# Patient Record
Sex: Female | Born: 1980 | Race: White | Hispanic: No | Marital: Married | Smoking: Never smoker
Health system: Southern US, Community
[De-identification: ages and names within clinical notes are randomized; demographics above are authoritative.]

## PROBLEM LIST (undated history)

## (undated) ENCOUNTER — Emergency Department (HOSPITAL_COMMUNITY): Admission: EM | Payer: Self-pay | Source: Home / Self Care

## (undated) DIAGNOSIS — F32A Depression, unspecified: Secondary | ICD-10-CM

## (undated) DIAGNOSIS — Z765 Malingerer [conscious simulation]: Secondary | ICD-10-CM

## (undated) DIAGNOSIS — F329 Major depressive disorder, single episode, unspecified: Secondary | ICD-10-CM

## (undated) DIAGNOSIS — R569 Unspecified convulsions: Secondary | ICD-10-CM

## (undated) DIAGNOSIS — F1110 Opioid abuse, uncomplicated: Secondary | ICD-10-CM

## (undated) DIAGNOSIS — F4310 Post-traumatic stress disorder, unspecified: Secondary | ICD-10-CM

## (undated) DIAGNOSIS — N200 Calculus of kidney: Secondary | ICD-10-CM

## (undated) DIAGNOSIS — T1491XA Suicide attempt, initial encounter: Secondary | ICD-10-CM

## (undated) DIAGNOSIS — J45909 Unspecified asthma, uncomplicated: Secondary | ICD-10-CM

## (undated) DIAGNOSIS — R51 Headache: Secondary | ICD-10-CM

## (undated) HISTORY — PX: CHOLECYSTECTOMY: SHX55

## (undated) HISTORY — PX: TONSILLECTOMY: SUR1361

## (undated) HISTORY — PX: APPENDECTOMY: SHX54

## (undated) HISTORY — PX: ANKLE SURGERY: SHX546

## (undated) HISTORY — PX: KNEE SURGERY: SHX244

## (undated) HISTORY — PX: INCISIONAL HERNIA REPAIR: SHX193

## (undated) HISTORY — DX: Post-traumatic stress disorder, unspecified: F43.10

---

## 2002-03-17 ENCOUNTER — Inpatient Hospital Stay (HOSPITAL_COMMUNITY): Admission: AD | Admit: 2002-03-17 | Discharge: 2002-03-17 | Payer: Self-pay | Admitting: *Deleted

## 2011-08-27 ENCOUNTER — Emergency Department (HOSPITAL_COMMUNITY): Payer: Medicaid Other

## 2011-08-27 ENCOUNTER — Encounter (HOSPITAL_COMMUNITY): Payer: Self-pay | Admitting: Emergency Medicine

## 2011-08-27 ENCOUNTER — Emergency Department (HOSPITAL_COMMUNITY)
Admission: EM | Admit: 2011-08-27 | Discharge: 2011-08-27 | Disposition: A | Discharge status: Home / Self Care | Payer: Medicaid Other | Attending: Emergency Medicine | Admitting: Emergency Medicine

## 2011-08-27 DIAGNOSIS — O2390 Unspecified genitourinary tract infection in pregnancy, unspecified trimester: Secondary | ICD-10-CM | POA: Insufficient documentation

## 2011-08-27 DIAGNOSIS — R109 Unspecified abdominal pain: Secondary | ICD-10-CM | POA: Insufficient documentation

## 2011-08-27 DIAGNOSIS — N390 Urinary tract infection, site not specified: Secondary | ICD-10-CM | POA: Insufficient documentation

## 2011-08-27 DIAGNOSIS — O26899 Other specified pregnancy related conditions, unspecified trimester: Secondary | ICD-10-CM | POA: Insufficient documentation

## 2011-08-27 DIAGNOSIS — R10814 Left lower quadrant abdominal tenderness: Secondary | ICD-10-CM | POA: Insufficient documentation

## 2011-08-27 HISTORY — DX: Calculus of kidney: N20.0

## 2011-08-27 HISTORY — DX: Major depressive disorder, single episode, unspecified: F32.9

## 2011-08-27 HISTORY — DX: Depression, unspecified: F32.A

## 2011-08-27 LAB — URINALYSIS, ROUTINE W REFLEX MICROSCOPIC
Nitrite: NEGATIVE
Specific Gravity, Urine: 1.032 — ABNORMAL HIGH (ref 1.005–1.030)
Urobilinogen, UA: 0.2 mg/dL (ref 0.0–1.0)
pH: 6 (ref 5.0–8.0)

## 2011-08-27 LAB — DIFFERENTIAL
Eosinophils Absolute: 0.1 10*3/uL (ref 0.0–0.7)
Lymphs Abs: 1.4 10*3/uL (ref 0.7–4.0)
Monocytes Relative: 5 % (ref 3–12)
Neutro Abs: 8.7 10*3/uL — ABNORMAL HIGH (ref 1.7–7.7)
Neutrophils Relative %: 81 % — ABNORMAL HIGH (ref 43–77)

## 2011-08-27 LAB — CBC
Hemoglobin: 11.3 g/dL — ABNORMAL LOW (ref 12.0–15.0)
MCH: 28.9 pg (ref 26.0–34.0)
Platelets: 159 10*3/uL (ref 150–400)
RBC: 3.91 MIL/uL (ref 3.87–5.11)
WBC: 10.7 10*3/uL — ABNORMAL HIGH (ref 4.0–10.5)

## 2011-08-27 LAB — URINE MICROSCOPIC-ADD ON

## 2011-08-27 LAB — POCT PREGNANCY, URINE: Preg Test, Ur: POSITIVE — AB

## 2011-08-27 MED ORDER — SODIUM CHLORIDE 0.9 % IV BOLUS (SEPSIS)
250.0000 mL | Freq: Once | INTRAVENOUS | Status: AC
  Administered 2011-08-27: 03:00:00 via INTRAVENOUS

## 2011-08-27 MED ORDER — ACETAMINOPHEN 500 MG PO TABS
1000.0000 mg | ORAL_TABLET | Freq: Once | ORAL | Status: AC
  Administered 2011-08-27: 1000 mg via ORAL
  Filled 2011-08-27: qty 2

## 2011-08-27 MED ORDER — NITROFURANTOIN MONOHYD MACRO 100 MG PO CAPS: 100.0000 mg | ORAL_CAPSULE | Freq: Two times a day (BID) | ORAL | Status: AC

## 2011-08-27 NOTE — ED Provider Notes (Signed)
Medical screening examination/treatment/procedure(s) were performed by non-physician practitioner and as supervising physician I was immediately available for consultation/collaboration.  Jasmine Awe, MD 08/27/11 [phone]

## 2011-08-27 NOTE — ED Notes (Signed)
Victoria Middleton [phone], pt's friend who drove her to the ER. Please call her if there are any questions, concerns, or pt is ready for discharge.

## 2011-08-27 NOTE — ED Notes (Signed)
Pt given discharge instructions and verb understanding, pt assisted to discharge window amb with steady gait

## 2011-08-27 NOTE — ED Notes (Addendum)
Pt presented to the ER with c/o abd pain in LLQ, 8/10, nauseous, denies vomiting, pain started about 20 min ago, G2, [redacted] weeks pregnant, one living child, premature at 34 weeks, pt denies any bleeding noted at this time.

## 2011-08-27 NOTE — ED Notes (Signed)
Sterile pelvic exam ordered per NP Manus Rudd. NP Manus Rudd and RN Ginger made aware that sterile speculum not immediately available. Materials called to retrieve speculum. Pt made aware of delay as well.

## 2011-08-27 NOTE — ED Provider Notes (Signed)
7:11 AM  Received sign out from Earley Favor, NP, at change of shift.  Patient is a 31 year old woman who is sixteen weeks pregnant with a confirmed intrauterine pregnancy, who presented today with left lower quadrant pain.  She is currently pending ultrasound.  9:20 AM Patient reports pain is well controlled currently, now 4/10.  No needs at this time.  Awaiting ultrasound results.   10:08 AM Called radiology for ultrasound results not showing up in computer.  Spoke with Johnny Bridge.    11:05 AM Discussed results with patient.  IUP is normal, ovaries not visualized.  +UTI.  Advised close follow up with obstetrician (in [place]).  Patient in no distressed and pain controlled at this time. Discharged home with recommendations for tylenol for pain and return for worsening symptoms.  Patient verbalizes understanding and agrees with plan. Rise Patience, PA 08/27/11 1106

## 2011-08-27 NOTE — ED Provider Notes (Signed)
History     CSN: [id number]  Arrival date & time 08/27/11  0147   First MD Initiated Contact with Patient 08/27/11 0214      Chief Complaint  Patient presents with  . Illegal value: [    pregnant [redacted]weeks  . Abdominal Pain    LLQ    (Consider location/radiation/quality/duration/timing/severity/associated sxs/prior treatment) HPI Comments: Patient is [redacted] weeks pregnant documented by her OB/GYN in [place], has had an uncomplicated pregnancy to this point.  Tonight was out with friends and Institute and noticed left lower quadrant pain without discharge, urinary frequency, not, nausea, but no vomiting.  Patient is a 31 y.o. female presenting with abdominal pain. The history is provided by the patient.  Abdominal Pain The primary symptoms of the illness include abdominal pain and nausea. The primary symptoms of the illness do not include shortness of breath, vomiting, vaginal discharge or vaginal bleeding. The current episode started 3 to 5 hours ago. The onset of the illness was sudden. The problem has not changed since onset. Symptoms associated with the illness do not include chills, constipation or urgency.    Past Medical History  Diagnosis Date  . Kidney stones   . Depression   . Migraine     Past Surgical History  Procedure Date  . Appendectomy   . Cholecystectomy   . Tonsillectomy   . Knee surgery   . Ankle surgery     left    History reviewed. No pertinent family history.  History  Substance Use Topics  . Smoking status: Never Smoker   . Smokeless tobacco: Not on file  . Alcohol Use: No    OB History    Grav Para Term Preterm Abortions TAB SAB Ect Mult Living   1               Review of Systems  Constitutional: Negative for chills.  HENT: Negative for rhinorrhea.   Respiratory: Negative for shortness of breath.   Gastrointestinal: Positive for nausea and abdominal pain. Negative for vomiting and constipation.  Genitourinary: Negative for urgency,  vaginal bleeding, vaginal discharge and vaginal pain.  Neurological: Negative for dizziness.    Allergies  Abilify; Penicillins; Sulfa antibiotics; Toradol; and Naprosyn  Home Medications   Current Outpatient Rx  Name Route Sig Dispense Refill  . PRENATAL MULTIVITAMIN CH Oral Take 1 tablet by mouth daily.    Marland Kitchen PROMETHAZINE HCL 25 MG PO TABS Oral Take 25 mg by mouth every 6 (six) hours as needed.      BP 126/70  Pulse 103  Temp(Src) 98.4 F (36.9 C) (Oral)  Resp 22  SpO2 100%  Physical Exam  Constitutional: She is oriented to person, place, and time. She appears well-developed and well-nourished.  HENT:  Head: Normocephalic.  Eyes: Pupils are equal, round, and reactive to light.  Neck: Normal range of motion.  Cardiovascular: Normal rate.   Pulmonary/Chest: Effort normal.  Abdominal: She exhibits no distension. There is tenderness in the left lower quadrant.  Genitourinary:       No cervical os dilatation.  No vaginal bleeding.  No prolapse of cord patient, examined in a sterile condition  Musculoskeletal: Normal range of motion.  Neurological: She is alert and oriented to person, place, and time.  Skin: Skin is warm.    ED Course  Procedures (including critical care time)  Labs Reviewed  URINALYSIS, ROUTINE W REFLEX MICROSCOPIC - Abnormal; Notable for the following:    Color, Urine AMBER (*) BIOCHEMICALS MAY BE  AFFECTED BY COLOR   APPearance CLOUDY (*)    Specific Gravity, Urine 1.032 (*)    Bilirubin Urine SMALL (*)    Ketones, ur TRACE (*)    Leukocytes, UA MODERATE (*)    All other components within normal limits  CBC - Abnormal; Notable for the following:    WBC 10.7 (*)    Hemoglobin 11.3 (*)    HCT 32.4 (*)    All other components within normal limits  DIFFERENTIAL - Abnormal; Notable for the following:    Neutrophils Relative 81 (*)    Neutro Abs 8.7 (*)    All other components within normal limits  POCT PREGNANCY, URINE - Abnormal; Notable for the  following:    Preg Test, Ur POSITIVE (*)    All other components within normal limits  URINE MICROSCOPIC-ADD ON - Abnormal; Notable for the following:    Squamous Epithelial / LPF MANY (*)    Bacteria, UA FEW (*)    Crystals CA OXALATE CRYSTALS (*)    All other components within normal limits   No results found.   No diagnosis found.    MDM  Will obtain complete pelvic ultrasound of [redacted] weeks gestational age with left lower quadrant        Arman Filter, NP 08/27/11 0422

## 2011-08-27 NOTE — ED Notes (Signed)
Returned from ultrasound.

## 2011-08-28 LAB — URINE CULTURE: Culture  Setup Time: 201302101113

## 2011-08-28 NOTE — ED Provider Notes (Signed)
Medical screening examination/treatment/procedure(s) were performed by non-physician practitioner and as supervising physician I was immediately available for consultation/collaboration.  Jasmine Awe, MD 08/28/11 [phone]

## 2012-09-12 ENCOUNTER — Encounter (HOSPITAL_COMMUNITY): Payer: Self-pay | Admitting: *Deleted

## 2012-09-12 ENCOUNTER — Inpatient Hospital Stay (HOSPITAL_COMMUNITY)
Admission: EM | Admit: 2012-09-12 | Discharge: 2012-09-17 | DRG: 885 | Disposition: A | Discharge status: Home / Self Care | Payer: Medicaid Other | Source: Other Acute Inpatient Hospital | Attending: Psychiatry | Admitting: Psychiatry

## 2012-09-12 DIAGNOSIS — Z79899 Other long term (current) drug therapy: Secondary | ICD-10-CM

## 2012-09-12 DIAGNOSIS — F331 Major depressive disorder, recurrent, moderate: Principal | ICD-10-CM | POA: Diagnosis present

## 2012-09-12 DIAGNOSIS — R569 Unspecified convulsions: Secondary | ICD-10-CM | POA: Diagnosis present

## 2012-09-12 HISTORY — DX: Unspecified convulsions: R56.9

## 2012-09-12 HISTORY — DX: Headache: R51

## 2012-09-12 MED ORDER — CLONIDINE HCL 0.1 MG PO TABS
0.1000 mg | ORAL_TABLET | ORAL | Status: AC
  Administered 2012-09-16 – 2012-09-17 (×2): 0.1 mg via ORAL
  Filled 2012-09-12 (×5): qty 1

## 2012-09-12 MED ORDER — DICYCLOMINE HCL 20 MG PO TABS: 20.0000 mg | ORAL_TABLET | Freq: Four times a day (QID) | ORAL | Status: DC | PRN

## 2012-09-12 MED ORDER — ONDANSETRON 4 MG PO TBDP
4.0000 mg | ORAL_TABLET | Freq: Four times a day (QID) | ORAL | Status: DC | PRN
  Administered 2012-09-15: 4 mg via ORAL

## 2012-09-12 MED ORDER — ALUM & MAG HYDROXIDE-SIMETH 200-200-20 MG/5ML PO SUSP: 30.0000 mL | ORAL | Status: DC | PRN

## 2012-09-12 MED ORDER — HYDROXYZINE HCL 25 MG PO TABS
25.0000 mg | ORAL_TABLET | Freq: Four times a day (QID) | ORAL | Status: DC | PRN
  Administered 2012-09-15 – 2012-09-16 (×2): 25 mg via ORAL

## 2012-09-12 MED ORDER — ASPIRIN-ACETAMINOPHEN-CAFFEINE 250-250-65 MG PO TABS
2.0000 | ORAL_TABLET | Freq: Once | ORAL | Status: AC | PRN
  Filled 2012-09-12: qty 2

## 2012-09-12 MED ORDER — LOPERAMIDE HCL 2 MG PO CAPS: 2.0000 mg | ORAL_CAPSULE | ORAL | Status: DC | PRN

## 2012-09-12 MED ORDER — CLONIDINE HCL 0.1 MG PO TABS
0.1000 mg | ORAL_TABLET | Freq: Four times a day (QID) | ORAL | Status: AC
  Administered 2012-09-12 – 2012-09-14 (×4): 0.1 mg via ORAL
  Filled 2012-09-12 (×14): qty 1

## 2012-09-12 MED ORDER — DULOXETINE HCL 60 MG PO CPEP
60.0000 mg | ORAL_CAPSULE | Freq: Every day | ORAL | Status: DC
  Administered 2012-09-12 – 2012-09-17 (×5): 60 mg via ORAL
  Filled 2012-09-12 (×10): qty 1

## 2012-09-12 MED ORDER — MAGNESIUM HYDROXIDE 400 MG/5ML PO SUSP: 30.0000 mL | Freq: Every day | ORAL | Status: DC | PRN

## 2012-09-12 MED ORDER — IBUPROFEN 200 MG PO TABS
400.0000 mg | ORAL_TABLET | ORAL | Status: DC | PRN
Start: 2012-09-12 — End: 2012-09-12

## 2012-09-12 MED ORDER — TOPIRAMATE 100 MG PO TABS
200.0000 mg | ORAL_TABLET | Freq: Every day | ORAL | Status: DC
  Administered 2012-09-12 – 2012-09-17 (×5): 200 mg via ORAL
  Filled 2012-09-12 (×10): qty 2

## 2012-09-12 MED ORDER — ACETAMINOPHEN 325 MG PO TABS
650.0000 mg | ORAL_TABLET | Freq: Four times a day (QID) | ORAL | Status: DC | PRN
  Administered 2012-09-14 – 2012-09-17 (×5): 650 mg via ORAL

## 2012-09-12 MED ORDER — METHOCARBAMOL 500 MG PO TABS: 500.0000 mg | ORAL_TABLET | Freq: Three times a day (TID) | ORAL | Status: DC | PRN

## 2012-09-12 MED ORDER — CLONIDINE HCL 0.1 MG PO TABS
0.1000 mg | ORAL_TABLET | Freq: Every day | ORAL | Status: DC
  Administered 2012-09-17: 0.1 mg via ORAL
  Filled 2012-09-12 (×2): qty 1
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
## 2012-09-12 NOTE — Progress Notes (Signed)
Patient ID: Victoria Middleton, female   DOB: Dec 06, 1980, 32 y.o.   MRN: [id number] Writer notified on call physician, Dr. Ferol Luz and received orders for Excedrin for migraines. Pt. Refused med saying "that won't help, I've tried all of that". Writer proceeded to encourage client to take meds and speak to physician in am to see if any changes could be made. Pt. Got angry and remarked "I know what I'll do, I'll just refuse all medicine tomorrow" Pt.then storms away from the window and doesn't take any meds. Pt. 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

## 2012-09-12 NOTE — Progress Notes (Signed)
Patient ID: JERRINE URSCHEL, female   DOB: 03/29/1981, 32 y.o.   MRN: [id number] D: Pt in dayroom watching TV, reports migraine at "8" of 10.  " I want take tylenol it's like water to me" "I take foiricet and hydrocodone at home"  Pt. Currently denies SHI, "I miss my babies".  A: Writer introduced self to client, and provided emotional support by listening.  Writer to call physician and report pt. Concerns. Staff will monitor q22min for safety. Staff encouraged karaoke. R: Pt. Is safe on the unit. Pt. Attended karaoke.

## 2012-09-12 NOTE — Progress Notes (Signed)
Adult Psychoeducational Group Note  Date:  09/12/2012 Time:  2000  Group Topic/Focus:  Karaoke  Participation Level:  Active  Participation Quality:  Appropriate, Attentive and Supportive  Affect:  Appropriate  Cognitive:  Appropriate  Insight: Good  Engagement in Group:  Engaged and Supportive  Modes of Intervention:  Support  Additional Comments:  Pt participated by singing a song and being supportive of her peers.   Humberto Seals [facility] 09/12/2012, 10:27 PM
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
## 2012-09-12 NOTE — Tx Team (Signed)
Initial Interdisciplinary Treatment Plan  PATIENT STRENGTHS: (choose at least two) Ability for insight Average or above average intelligence Capable of independent living General fund of knowledge Supportive family/friends  PATIENT STRESSORS: Financial difficulties Occupational concerns   PROBLEM LIST: Problem List/Patient Goals Date to be addressed Date deferred Reason deferred Estimated date of resolution  Depression 09/12/12     Suicidal thoughts 09/12/12                                                DISCHARGE CRITERIA:  Ability to meet basic life and health needs Adequate post-discharge living arrangements Improved stabilization in mood, thinking, and/or behavior Verbal commitment to aftercare and medication compliance  PRELIMINARY DISCHARGE PLAN: Participate in family therapy Return to previous living arrangement  PATIENT/FAMIILY INVOLVEMENT: This treatment plan has been presented to and reviewed with the patient, Victoria Middleton, and/or family member, .  The patient and family have been given the opportunity to ask questions and make suggestions.  Victoria Middleton, [place] 09/12/2012, 5:20 PM

## 2012-09-13 ENCOUNTER — Encounter (HOSPITAL_COMMUNITY): Payer: Self-pay | Admitting: Psychiatry

## 2012-09-13 ENCOUNTER — Inpatient Hospital Stay (HOSPITAL_COMMUNITY): Payer: Medicaid Other

## 2012-09-13 LAB — TOPIRAMATE LEVEL: Topiramate Lvl: 5.2 ug/mL (ref 2.0–25.0)

## 2012-09-13 LAB — CBC WITH DIFFERENTIAL/PLATELET
Hemoglobin: 12.4 g/dL (ref 12.0–15.0)
Lymphocytes Relative: 14 % (ref 12–46)
Lymphs Abs: 1.2 10*3/uL (ref 0.7–4.0)
Monocytes Relative: 7 % (ref 3–12)
Neutro Abs: 6.7 10*3/uL (ref 1.7–7.7)
Neutrophils Relative %: 78 % — ABNORMAL HIGH (ref 43–77)
RBC: 4.57 MIL/uL (ref 3.87–5.11)
WBC: 8.6 10*3/uL (ref 4.0–10.5)

## 2012-09-13 LAB — BASIC METABOLIC PANEL
BUN: 18 mg/dL (ref 6–23)
Chloride: 107 mEq/L (ref 96–112)
Glucose, Bld: 90 mg/dL (ref 70–99)
Potassium: 4.1 mEq/L (ref 3.5–5.1)

## 2012-09-13 MED ORDER — OXYCODONE-ACETAMINOPHEN 5-325 MG PO TABS
1.0000 | ORAL_TABLET | Freq: Once | ORAL | Status: AC
  Administered 2012-09-13: 1 via ORAL
  Filled 2012-09-13: qty 1

## 2012-09-13 MED ORDER — SODIUM CHLORIDE 0.9 % IV SOLN
INTRAVENOUS | Status: DC
  Administered 2012-09-13: 21:00:00 via INTRAVENOUS
  Filled 2012-09-13: qty 1000

## 2012-09-13 MED ORDER — MORPHINE SULFATE 4 MG/ML IJ SOLN
4.0000 mg | Freq: Once | INTRAMUSCULAR | Status: AC
  Administered 2012-09-13: 4 mg via INTRAVENOUS
  Filled 2012-09-13: qty 1

## 2012-09-13 NOTE — Progress Notes (Signed)
D: Patient's affect/mood is flat, depressed and slightly angry at times. Patient observed lying in bed most of the day. She reported on the self inventory sheet that her sleep is fair, appetite is poor, energy level is low and ability to pay attention is poor. Patient rated depression "7" and feelings of hopelessness "8". Writer spoke with patient's father earlier via phone and was informed that the patient will not be able to return home. Patient's father verbalized that he currently has custody of the patient's ten year old daughter and in the process of obtaining custody of the 100 old baby as well. MD and social worker made aware of this information. Patient non-compliant with morning medication regimen.  A: Support and encouragement provided to patient. Administered scheduled medications per ordering MD. Monitor Q15 minute checks for safety.  R: Patient receptive. Denies SI/HI/AVH. Patient remains safe.

## 2012-09-13 NOTE — Tx Team (Signed)
Interdisciplinary Treatment Plan Update   Date Reviewed:  09/13/2012  Time Reviewed:  10:09 AM  Progress in Treatment:   Attending groups: Yes Participating in groups: Yes, patient participated in group Taking medication as prescribed: Yes  Tolerating medication: Yes Family/Significant other contact made: No, contact to be made with family Patient understands diagnosis: Yes  Discussing patient identified problems/goals with staff: Yes Medical problems stabilized or resolved: Yes Denies suicidal/homicidal ideation: Yes Patient has not harmed self or others: Yes  For review of initial/current patient goals, please see plan of care.  Estimated Length of Stay:  3-5 days  Reasons for Continued Hospitalization:  Anxiety Depression Medication stabilization Suicidal ideation  New Problems/Goals identified:    Discharge Plan or Barriers:   Home with outpatient follow up  Additional Comments:  Patient admitted to hospital following ODing on Klonopin.  She currently denies SI/HI and rates depression at and hopeless at nine and anxiety/helplessness at ten.  MD to discharge benzos and pain meds.  Will start back on medication for seizure disorder.  Attendees:  Patient: Victoria Middleton 09/13/2012 10:09 AM   Signature: Patrick North, MD 09/13/2012 10:09 AM  Signature 09/13/2012 10:09 AM  Signature: Harold Barban, RN 09/13/2012 10:09 AM  Signature: 09/13/2012 10:09 AM  Signature:   09/13/2012 10:09 AM  Signature:  Juline Patch, LCSW 09/13/2012 10:09 AM  Signature: Silverio Decamp, PMH-NP 09/13/2012 10:09 AM  Signature:  09/13/2012 10:09 AM  Signature:    Signature:    Signature:    Signature:      Scribe for Treatment Team:   Juline Patch,  09/13/2012 10:09 AM

## 2012-09-13 NOTE — ED Notes (Signed)
Pt was found on floor by bed unresponsive per [facility] staff. Per ems arrival. Pt on floor. Pt alert and oriented x 4 and able to make needs known. Pt c/o head and neck pain. Pt on back board and c-collar.

## 2012-09-13 NOTE — H&P (Signed)
Behavioral Health Medical Screening Exam  Victoria Middleton is an 32 y.o. female.  Review of Systems  Constitutional: Negative.   Eyes: Negative.   Respiratory: Negative.   Cardiovascular: Negative.   Gastrointestinal: Negative.   Genitourinary: Negative.   Musculoskeletal:       Right shoulder pain  Skin: Negative.   Neurological: Negative.   Endo/Heme/Allergies: Negative.   Psychiatric/Behavioral: Positive for depression.    Physical Exam  Constitutional: She is oriented to person, place, and time. She appears well-developed and well-nourished.  HENT:  Head: Normocephalic and atraumatic.  Right Ear: External ear normal.  Left Ear: External ear normal.  Nose: Nose normal.  Mouth/Throat: Oropharynx is clear and moist.  Eyes: Conjunctivae and EOM are normal. Pupils are equal, round, and reactive to light.  Neck: Normal range of motion. Neck supple.  Cardiovascular: Normal rate, regular rhythm, normal heart sounds and intact distal pulses.   Respiratory: Effort normal and breath sounds normal.  GI: Soft. Bowel sounds are normal.  Genitourinary:  deferred  Musculoskeletal: Normal range of motion.  Neurological: She is alert and oriented to person, place, and time. She has normal reflexes.  Skin: Skin is warm and dry.  Multiple bruises and abrasion to her forehead    Blood pressure 137/85, pulse 90, temperature 98.2 F (36.8 C), temperature source Oral, resp. rate 20, height 5\' 7"  (1.702 m), weight 128.822 kg (284 lb).   Nanine Means, PMH-NP 09/13/2012, 12:26 PM

## 2012-09-13 NOTE — Progress Notes (Signed)
[facility] LCSW Group Therapy         Feelings Around Relapse        1:15-2:30 PM    09/13/2012 3:22 PM  Type of Therapy:  Group Therapy  Participation Level:  Active  Participation Quality:  Appropriate and Attentive  Affect:  Appropriate and Depressed  Cognitive:  Appropriate  Insight:  Engaged  Engagement in Therapy:  Engaged  Modes of Intervention:  Education, Exploration, Problem-solving, Rapport Building and Support  Summary of Progress/Problems:  Patient shared relapse for her would mean not taking medications and/or following up with therapist.     Wynn Banker 09/13/2012, 3:22 PM

## 2012-09-13 NOTE — Progress Notes (Signed)
Recreation Therapy Notes   Date: 02.28.2014 Time: 1:30am Location: Art Room      Group Topic/Focus: Decision Making  Participation Level: Did not attend  Jearl Klinefelter, LRT/CTRS   Jearl Klinefelter 09/13/2012 3:58 PM

## 2012-09-13 NOTE — Progress Notes (Signed)
[facility] LCSW Aftercare Discharge Planning Group Note  09/13/2012 10:13 AM  Participation Quality:  Appropriate  Affect:  Appropriate, Depressed, Flat and Tearful  Cognitive:  Appropriate  Insight:  Developing/Improving  Engagement in Group:  Developing/Improving  Modes of Intervention:  Exploration, Problem-solving, Rapport Building and Support  Summary of Progress/Problems: Patient reports admitting to hospital due to ODing on Klonopin.  She cited stressor as being a single parent, mother being in hospital with SI and financial problems due to not having a full time job.  Patient reports having home and transportation but no outpatient services.    Daily workbook provided.  Wynn Banker 09/13/2012, 10:13 AM
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
## 2012-09-13 NOTE — ED Notes (Signed)
Pt out of ed with security and sitter to transport back to [facility]
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
## 2012-09-13 NOTE — ED Provider Notes (Signed)
History    CSN: [id number] Arrival date & time 09/13/12  2006 First MD Initiated Contact with Patient 09/13/12 2047     Chief Complaint  Patient presents with  . Fall  . Seizures    HPI Patient presents to the emergency room after being found on the floor unresponsive. Patient has a history of seizures. She currently is an inpatient at [facility]. The patient is being treated for an overdose of Klonopin, and suicide attempt.  Patient does not know what happened. She does have history of seizures and takes Topamax. Patient complains of pain in her head and neck. She denies any chest pain or shortness of breath. She denies any abdominal pain numbness or weakness. .   Past Medical History  Diagnosis Date  . Headache   . Seizures     History reviewed. No pertinent past surgical history.  History reviewed. No pertinent family history.  History  Substance Use Topics  . Smoking status: Never Smoker   . Smokeless tobacco: Never Used  . Alcohol Use: No    OB History   Grav Para Term Preterm Abortions TAB SAB Ect Mult Living                  Review of Systems  All other systems reviewed and are negative.    Allergies  Benadryl; Abilify; Naprosyn; Penicillins; Sulfa antibiotics; and Toradol  Home Medications   Current Outpatient Rx  Name  Route  Sig  Dispense  Refill  . butalbital-acetaminophen-caffeine (FIORICET, ESGIC) 50-325-40 MG per tablet   Oral   Take 1 tablet by mouth every 6 (six) hours as needed for headache.         . clonazePAM (KLONOPIN) 1 MG tablet   Oral   Take 1 mg by mouth 2 (two) times daily as needed for anxiety.          . DULoxetine (CYMBALTA) 60 MG capsule   Oral   Take 60 mg by mouth every morning.          Marland Kitchen HYDROcodone-acetaminophen (NORCO/VICODIN) 5-325 MG per tablet   Oral   Take 1 tablet by mouth every 6 (six) hours as needed for pain.         Marland Kitchen topiramate (TOPAMAX) 100 MG tablet   Oral   Take 200 mg  by mouth every morning.           BP 115/99  Pulse 66  Temp(Src) 98.3 F (36.8 C) (Oral)  Resp 18  Ht 5\' 7"  (1.702 m)  Wt 284 lb (128.822 kg)  BMI 44.47 kg/m2  SpO2 96%  LMP 09/06/2012  Physical Exam  Nursing note and vitals reviewed. Constitutional: She appears well-developed and well-nourished. No distress.  Obese Immobilized with a cervical spine collar  HENT:  Head: Normocephalic.  Right Ear: External ear normal.  Left Ear: External ear normal.  Mild erythema of the forehead  Eyes: Conjunctivae are normal. Right eye exhibits no discharge. Left eye exhibits no discharge. No scleral icterus.  Neck: Neck supple. No tracheal deviation present.  Cardiovascular: Normal rate, regular rhythm and intact distal pulses.   Pulmonary/Chest: Effort normal and breath sounds normal. No stridor. No respiratory distress. She has no wheezes. She has no rales.  Abdominal: Soft. Bowel sounds are normal. She exhibits no distension. There is no tenderness. There is no rebound and no guarding.  Musculoskeletal: She exhibits no edema and no tenderness.       Right shoulder: Normal.  Left shoulder: Normal.       Right hip: Normal.       Left hip: Normal.       Cervical back: She exhibits tenderness and bony tenderness. She exhibits no swelling, no edema and no deformity.       Thoracic back: She exhibits no tenderness and no bony tenderness.       Lumbar back: She exhibits no tenderness and no bony tenderness.  Neurological: She is alert. She has normal strength. No sensory deficit. Cranial nerve deficit:  no gross defecits noted. She exhibits normal muscle tone. She displays no seizure activity. Coordination normal.  Skin: Skin is warm and dry. No rash noted.  Psychiatric: She has a normal mood and affect.    ED Course  Procedures (including critical care time)  Labs Reviewed  CBC WITH DIFFERENTIAL - Abnormal; Notable for the following:    Neutrophils Relative 78 (*)    All other  components within normal limits  BASIC METABOLIC PANEL - Abnormal; Notable for the following:    Creatinine, Ser 1.20 (*)    GFR calc non Af Amer 59 (*)    GFR calc Af Amer 68 (*)    All other components within normal limits  TOPIRAMATE LEVEL  GLUCOSE, CAPILLARY   Ct Head Wo Contrast  09/13/2012  *RADIOLOGY REPORT*  Clinical Data:  Fall, seizure, head and neck pain.  CT HEAD WITHOUT CONTRAST CT CERVICAL SPINE WITHOUT CONTRAST  Technique:  Multidetector CT imaging of the head and cervical spine was performed following the standard protocol without intravenous contrast.  Multiplanar CT image reconstructions of the cervical spine were also generated.  Comparison:   None  CT HEAD  Findings: There is no evidence for acute hemorrhage, hydrocephalus, mass lesion, or abnormal extra-axial fluid collection.  No definite CT evidence for acute infarction.  The visualized paranasal sinuses and mastoid air cells are predominately clear.  No displaced calvarial fracture.  IMPRESSION: No acute intracranial abnormality.  CT CERVICAL SPINE  Findings: Maintained craniocervical relationship.  No dens fracture.  Maintained vertebral body height and alignment.  There is mild degenerative change at C6-7.  Paravertebral soft tissues within normal limits.  Lung apices clear.  IMPRESSION: No acute osseous abnormality of the cervical spine.   Original Report Authenticated By: Jearld Lesch, M.D.    Ct Cervical Spine Wo Contrast  09/13/2012  *RADIOLOGY REPORT*  Clinical Data:  Fall, seizure, head and neck pain.  CT HEAD WITHOUT CONTRAST CT CERVICAL SPINE WITHOUT CONTRAST  Technique:  Multidetector CT imaging of the head and cervical spine was performed following the standard protocol without intravenous contrast.  Multiplanar CT image reconstructions of the cervical spine were also generated.  Comparison:   None  CT HEAD  Findings: There is no evidence for acute hemorrhage, hydrocephalus, mass lesion, or abnormal extra-axial  fluid collection.  No definite CT evidence for acute infarction.  The visualized paranasal sinuses and mastoid air cells are predominately clear.  No displaced calvarial fracture.  IMPRESSION: No acute intracranial abnormality.  CT CERVICAL SPINE  Findings: Maintained craniocervical relationship.  No dens fracture.  Maintained vertebral body height and alignment.  There is mild degenerative change at C6-7.  Paravertebral soft tissues within normal limits.  Lung apices clear.  IMPRESSION: No acute osseous abnormality of the cervical spine.   Original Report Authenticated By: Jearld Lesch, M.D.      1. Major depression   2. Seizure   3. Forehead contusion, initial encounter  MDM  Pt has returned to baseline.  Does not appear post ictal.  She takes topomate for her seizures.  She was admitted to [facility] for overdose.  It is possible that she has been abusing benzodiazepines which could be a contributing factor.  Pt is stable for transfer back to [facility]        Celene Kras, MD 09/13/12 2253

## 2012-09-13 NOTE — Progress Notes (Signed)
Adult Psychoeducational Group Note  Date:  09/13/2012 Time:  11:53 AM  Group Topic/Focus:  Relapse Prevention Planning:   The focus of this group is to define relapse and discuss the need for planning to combat relapse.  Participation Level:  Active  Participation Quality:  Attentive  Affect:  Appropriate  Cognitive:  Oriented  Insight: Good  Engagement in Group:  Lacking  Modes of Intervention:  Discussion and Education  Additional Comments:  Pt said she is here for OD and wants to find coping skills for depression. Pt is motivated for recovery.  Ericberto Padget T 09/13/2012, 11:53 AM

## 2012-09-13 NOTE — Progress Notes (Signed)
Psychoeducational Group Note  Date:  09/13/2012 Time:  2000  Group Topic/Focus:  Wrap-Up Group:   The focus of this group is to help patients review their daily goal of treatment and discuss progress on daily workbooks.  Participation Level: Did Not Attend  Participation Quality:  Not Applicable  Affect:  Not Applicable  Cognitive:  Not Applicable  Insight:  Not Applicable  Engagement in Group: Not Applicable  Additional Comments:  The patient was off of the floor during group.   Hazle Coca S 09/13/2012, 11:44 PM
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
## 2012-09-14 MED ORDER — GABAPENTIN 300 MG PO CAPS
300.0000 mg | ORAL_CAPSULE | Freq: Three times a day (TID) | ORAL | Status: DC
  Administered 2012-09-14 – 2012-09-17 (×10): 300 mg via ORAL
  Filled 2012-09-14 (×15): qty 1

## 2012-09-14 MED ORDER — METHOCARBAMOL 500 MG PO TABS
500.0000 mg | ORAL_TABLET | Freq: Three times a day (TID) | ORAL | Status: DC
  Administered 2012-09-14 – 2012-09-17 (×10): 500 mg via ORAL
  Filled 2012-09-14 (×15): qty 1

## 2012-09-14 MED ORDER — NABUMETONE 500 MG PO TABS
500.0000 mg | ORAL_TABLET | Freq: Two times a day (BID) | ORAL | Status: DC
  Administered 2012-09-14 – 2012-09-17 (×7): 500 mg via ORAL
  Filled 2012-09-14 (×11): qty 1
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
## 2012-09-14 NOTE — Progress Notes (Addendum)
D) Pt going up the hall crying and slamming her door. Pt sitting on her bed crying loudly and moaning. When this writer approached Pt. Said "get the f--- out of my room. You all don't give a f---about me anyway. I am in pain". Directing her anger and frustration verbally at this Clinical research associate. Verbalized her feelings about the failure in her overdose attempted and stopped directing her anger outwardly and started talking about herself and what "aloser I am". Also talked directly with Pt about her wanting pain medications and how this has been her focus since walking in the building. A) Provided Pt with a lengthy 1:1. Used therapeutic listening and a gently approach with this Pt. Provided Pt with firm, yet gentle boundaries. Was non judgmental in the approach with Pt. Allowed Pt to verbalize her feelings and was able to validate her feelings when appropriate. Given support and reassurance R) Pt was able to calm down and together with the NP was able to come up with a plan to address Pt's pain. Pt was able to go to lunch and also to attend the program in the afternoon.

## 2012-09-14 NOTE — Progress Notes (Signed)
Verne Spurr PA was called an an order received to have patient sent out to ED after an unwittnessed fall. Writer realized after patient was sent to ED and this order from P.A. had not been obtained. A CT of head and cervical spine done and results were negative. Writer received report from Dillard Essex and at [id number], mht on hall reported a pt down on the floor in her room. Code blue called vs taken and EMS called for pt to be sent out for observation of fall. Patient returned from ED and was on unit at 2330. Patient asked to make phone call and went to her room and laid down. Writer entered patients room and observed her lying on the bed writing in her workbook. Writer informed patient that her hs clonidine that was not given while at the ED was being held after order received from Dr. Hilton Cork d/t medications received while at ED and her bp. Patient voiced no complaints, denies si/hi/a/v hallucinations. Patient encouraged to try and get some rest for tomorrow. Safety maintained on unit with 15 min checks in place, will continue to monitor.

## 2012-09-14 NOTE — Progress Notes (Signed)
Psychoeducational Group Note  Date:  09/14/2012 Time: 1015  Group Topic/Focus:  Identifying Needs:   The focus of this group is to help patients identify their personal needs that have been historically problematic and identify healthy behaviors to address their needs.  Participation Level:  Active  Participation Quality:  Appropriate  Affect:  anxious  Cognitive:  Alert  Insight:  Engaged  Engagement in Group:  Engaged  Additional Comments:    09/14/2012,3:47 PM Ellyse Rotolo, Joie Bimler

## 2012-09-14 NOTE — Progress Notes (Signed)
[facility] MD Progress Note  09/14/2012 10:52 AM Victoria Middleton  MRN:  [id number] Subjective:  Patient upset with NP because she wanted a narcotic pain medication and ibuprofen or Relafen was offered instead.  She stated, she "hopes that we all experience excruciating pain and suffer."  Khamya has cluster B traits and is determined to get what she wants.  She stopped taking her Topamax on admission due to her not receiving narcotic medication until she said she had a seizure last night and fell out of bed.  She was sent to the ED and given morphine and narcotic medications there.  When she realized she was not going to get them here--she threw her chair on the floor and slammed the door.  Refused all pain medications except the ones she wants.  Patient transferred to the 400 hall for safety reasons.   Diagnosis:   Axis I: Substance Abuse and Substance Induced Mood Disorder Axis II: Cluster B Traits Axis III:  Past Medical History  Diagnosis Date  . Headache   . Seizures    Axis IV: economic problems, housing problems, other psychosocial or environmental problems, problems related to social environment and problems with primary support group Axis V: 41-50 serious symptoms  ADL's:  Intact  Sleep: Fair  Appetite:  Fair  Suicidal Ideation:  Denies Homicidal Ideation:  Denies  Psychiatric Specialty Exam: Review of Systems  Constitutional: Negative.   HENT: Negative.   Eyes: Negative.   Respiratory: Negative.   Cardiovascular: Negative.   Gastrointestinal: Negative.   Genitourinary: Negative.   Musculoskeletal:       Shoulder pain  Skin: Negative.   Neurological: Negative.   Endo/Heme/Allergies: Negative.   Psychiatric/Behavioral: Positive for depression and substance abuse. The patient is nervous/anxious.     Blood pressure 103/70, pulse 63, temperature 97.4 F (36.3 C), temperature source Oral, resp. rate 16, height 5\' 7"  (1.702 m), weight 128.822 kg (284 lb), last menstrual period  09/06/2012, SpO2 100.00%.Body mass index is 44.47 kg/(m^2).  General Appearance: Casual  Eye Contact::  Fair  Speech:  Normal Rate  Volume:  Increased  Mood:  Angry  Affect:  Congruent  Thought Process:  Coherent  Orientation:  Full (Time, Place, and Person)  Thought Content:  WDL  Suicidal Thoughts:  No  Homicidal Thoughts:  No  Memory:  Immediate;   Good Recent;   Good Remote;   Good  Judgement:  Poor  Insight:  Lacking  Psychomotor Activity:  Normal  Concentration:  Good  Recall:  Good  Akathisia:  No  Handed:  Right  AIMS (if indicated):     Assets:  Physical Health Resilience  Sleep:  Number of Hours: 4.25   Current Medications: Current Facility-Administered Medications  Medication Dose Route Frequency Provider Last Rate Last Dose  . 0.9 %  sodium chloride infusion   Intravenous Continuous Celene Kras, MD 125 mL/hr at 09/13/12 2121    . acetaminophen (TYLENOL) tablet 650 mg  650 mg Oral Q6H PRN Sanjuana Kava, NP   650 mg at 09/14/12 4098  . alum & mag hydroxide-simeth (MAALOX/MYLANTA) 200-200-20 MG/5ML suspension 30 mL  30 mL Oral Q4H PRN Sanjuana Kava, NP      . cloNIDine (CATAPRES) tablet 0.1 mg  0.1 mg Oral QID Sanjuana Kava, NP   0.1 mg at 09/13/12 1723   Followed by  . [START ON 09/15/2012] cloNIDine (CATAPRES) tablet 0.1 mg  0.1 mg Oral BH-qamhs Sanjuana Kava, NP  Followed by  . [START ON 09/17/2012] cloNIDine (CATAPRES) tablet 0.1 mg  0.1 mg Oral QAC breakfast Sanjuana Kava, NP      . dicyclomine (BENTYL) tablet 20 mg  20 mg Oral Q6H PRN Sanjuana Kava, NP      . DULoxetine (CYMBALTA) DR capsule 60 mg  60 mg Oral Daily Sanjuana Kava, NP   60 mg at 09/14/12 0825  . hydrOXYzine (ATARAX/VISTARIL) tablet 25 mg  25 mg Oral Q6H PRN Sanjuana Kava, NP      . loperamide (IMODIUM) capsule 2-4 mg  2-4 mg Oral PRN Sanjuana Kava, NP      . magnesium hydroxide (MILK OF MAGNESIA) suspension 30 mL  30 mL Oral Daily PRN Sanjuana Kava, NP      . methocarbamol (ROBAXIN) tablet 500  mg  500 mg Oral Q8H PRN Sanjuana Kava, NP      . ondansetron (ZOFRAN-ODT) disintegrating tablet 4 mg  4 mg Oral Q6H PRN Sanjuana Kava, NP      . topiramate (TOPAMAX) tablet 200 mg  200 mg Oral Daily Sanjuana Kava, NP   200 mg at 09/14/12 0825    Lab Results:  Results for orders placed during the hospital encounter of 09/12/12 (from the past 48 hour(s))  TOPIRAMATE LEVEL     Status: None   Collection Time    09/12/12  7:47 PM      Result Value Range   Topiramate Lvl 5.2  2.0 - 25.0 ug/mL  GLUCOSE, CAPILLARY     Status: None   Collection Time    09/13/12  7:36 PM      Result Value Range   Glucose-Capillary 85  70 - 99 mg/dL  CBC WITH DIFFERENTIAL     Status: Abnormal   Collection Time    09/13/12  8:57 PM      Result Value Range   WBC 8.6  4.0 - 10.5 K/uL   RBC 4.57  3.87 - 5.11 MIL/uL   Hemoglobin 12.4  12.0 - 15.0 g/dL   HCT 14.7  82.9 - 56.2 %   MCV 84.5  78.0 - 100.0 fL   MCH 27.1  26.0 - 34.0 pg   MCHC 32.1  30.0 - 36.0 g/dL   RDW 13.0  86.5 - 78.4 %   Platelets 210  150 - 400 K/uL   Neutrophils Relative 78 (*) 43 - 77 %   Neutro Abs 6.7  1.7 - 7.7 K/uL   Lymphocytes Relative 14  12 - 46 %   Lymphs Abs 1.2  0.7 - 4.0 K/uL   Monocytes Relative 7  3 - 12 %   Monocytes Absolute 0.6  0.1 - 1.0 K/uL   Eosinophils Relative 1  0 - 5 %   Eosinophils Absolute 0.1  0.0 - 0.7 K/uL   Basophils Relative 0  0 - 1 %   Basophils Absolute 0.0  0.0 - 0.1 K/uL  BASIC METABOLIC PANEL     Status: Abnormal   Collection Time    09/13/12  8:57 PM      Result Value Range   Sodium 138  135 - 145 mEq/L   Potassium 4.1  3.5 - 5.1 mEq/L   Chloride 107  96 - 112 mEq/L   CO2 21  19 - 32 mEq/L   Glucose, Bld 90  70 - 99 mg/dL   BUN 18  6 - 23 mg/dL   Creatinine, Ser 6.96 (*) 0.50 -  1.10 mg/dL   Calcium 8.6  8.4 - 81.1 mg/dL   GFR calc non Af Amer 59 (*) >90 mL/min   GFR calc Af Amer 68 (*) >90 mL/min   Comment:            The eGFR has been calculated     using the CKD EPI equation.      This calculation has not been     validated in all clinical     situations.     eGFR's persistently     <90 mL/min signify     possible Chronic Kidney Disease.    Physical Findings: AIMS: Facial and Oral Movements Muscles of Facial Expression: None, normal Lips and Perioral Area: None, normal Jaw: None, normal Tongue: None, normal,Extremity Movements Upper (arms, wrists, hands, fingers): None, normal Lower (legs, knees, ankles, toes): None, normal, Trunk Movements Neck, shoulders, hips: None, normal, Overall Severity Severity of abnormal movements (highest score from questions above): None, normal Incapacitation due to abnormal movements: None, normal Patient's awareness of abnormal movements (rate only patient's report): No Awareness, Dental Status Current problems with teeth and/or dentures?: No Does patient usually wear dentures?: No  CIWA:  CIWA-Ar Total: 0 COWS:     Treatment Plan Summary: Daily contact with patient to assess and evaluate symptoms and progress in treatment Medication management  Plan:  Review of chart, vital signs, medications, and notes. 1-Individual and group therapy 2-Medication management for depression and anxiety:  Medications reviewed with the patient and she is demanding narcotic pain medications---refused to eat on admission until she got her narcotic pain medications but changed her mind, reported a seizure and a fall last night and sent to the ED 3-Coping skills for depression, anxiety, and back pain 4-Continue crisis stabilization and management 5-Address health issues--stable vital signs 6-Treatment plan in progress to prevent relapse of depression and anxiety  Medical Decision Making Problem Points:  Established problem, stable/improving (1) and Review of psycho-social stressors (1) Data Points:  Review of medication regiment & side effects (2)  I certify that inpatient services furnished can reasonably be expected to improve the patient's  condition.   Nanine Means, PMh-NP 09/14/2012, 10:52 AM

## 2012-09-14 NOTE — Progress Notes (Signed)
Goals l Group Note  Date:  0930 Time: 03/-07/2012 Group Topic/Focus:  Identifying  Goals : The focus of this group is to help the patients identify goals they want to obtain as well as to motivate the patients to begin to make the positive changes in their lives they need to make.  Participation Level:  active Participation Quality: good Affect: flat Cognitive:    Insight:  good  Engagement in Group: engaged  Additional Comments:  PDuke RN [employer]

## 2012-09-15 NOTE — Progress Notes (Signed)
Psychoeducational Group Note  Date:  09/15/2012 Time:  1015  Group Topic/Focus:  Making Healthy Choices:   The focus of this group is to help patients identify negative/unhealthy choices they were using prior to admission and identify positive/healthier coping strategies to replace them upon discharge.  Participation Level:  Active  Participation Quality:  Appropriate  Affect:  Appropriate  Cognitive:  Alert  Insight:  Improving  Engagement in Group:  Improving  Additional Comments:    Dione Housekeeper 09/15/2012

## 2012-09-15 NOTE — Progress Notes (Signed)
Group Topic/Focus:  Gratefulness:  The focus of this group is to help patients identify what two things they are most grateful for in their lives. What helps ground them and to center them on their work to their recovery.  Participation Level:  Active  Participation Quality:  Attentive  Affect:  Appropriate  Cognitive:  Alert  Insight:  Engaged  Engagement in Group:  Engaged  Additional Comments:  Pt stated that she was grateful for her children.  Dione Housekeeper

## 2012-09-15 NOTE — Progress Notes (Signed)
Writer has observed patient in her room lying in bed asleep off and on. Patient did not attend group this evening. Writer entered patients room and informed her about medication due and one was overdue. Patient was encouraged to come to medication room to receive her meds. Patient was compliant with scheduled meds and was encouraged to stay up a little and have a snack which she did. Patient was offered support and encouragement, denies si/hi/a/v hallucinations. Safety maintained on unit, will continue to monitor with 15 min checks.

## 2012-09-15 NOTE — Clinical Social Work Note (Signed)
[facility] Group Notes: (Clinical Social Work)   09/15/2012      Type of Therapy:  Group Therapy   Participation Level:  Did Not Attend   Patient did come to group a few minutes before it ended, and was given encouragement for coming, but did not speak other than to tell the group she had a migraine.   Ambrose Mantle, LCSW 09/15/2012, 4:35 PM

## 2012-09-15 NOTE — Progress Notes (Addendum)
D) Pt stated she was not feeling well this morning and felt as though she was going to faint. Patient denies SI and HI. States when she goes home she will take her medication, go to a counselor and try not to self doubt and put herself down. Also wrote on her survey that she needed help with getting counseling and not stay in her room so much while she is here. A) Pt placed on the floor and her legs were placed up on a chair. Pt was given some crackers to eat. Blood pressure was 104/71 and then sitting 111/74.  Pt given positive feedback and given encouragement. R) Pt stated she felt better and was able to go to the dayroom and attend the groups. Attending and participating

## 2012-09-15 NOTE — Progress Notes (Signed)
[facility] MD Progress Note  09/15/2012 2:11 PM Victoria Middleton  MRN:  [id number] Subjective:  "My head hurts so bad."  Patient received PRN medication about ten minutes prior to assessment--positive encouragement given, lights dimmed, cool cloth offered but refused.  When asked what has helped her in the past, "Have to get some kind of pain shot."  Very dramatic in the hallway when she was getting her PRN medications---crying loudly, moaning, squatting.  Dramatic behaviors and actions appear to increase when patient wants narcotic pain medications---stated on admission she would not eat or drink unless she received them,...etc.  Non-narcotic pain medications and muscle relaxer in place to assist the patient during this difficult transition from narcotic use to non-narcotic use, encouragement from staff continues. Diagnosis:   Axis I: Substance Abuse and Substance Induced Mood Disorder Axis II: Cluster B Traits Axis III:  Past Medical History  Diagnosis Date  . Headache   . Seizures    Axis IV: economic problems, occupational problems, other psychosocial or environmental problems, problems related to social environment and problems with primary support group Axis V: 41-50 serious symptoms  ADL's:  Intact  Sleep: Good  Appetite:  Goode  Suicidal Ideation:  Denies Homicidal Ideation:  Denies  Psychiatric Specialty Exam: Review of Systems  Constitutional: Negative.   Eyes: Negative.   Respiratory: Negative.   Cardiovascular: Negative.   Gastrointestinal: Negative.   Genitourinary: Negative.   Musculoskeletal: Negative.   Skin: Negative.   Neurological: Positive for headaches.  Endo/Heme/Allergies: Negative.   Psychiatric/Behavioral: Positive for depression and substance abuse. The patient is nervous/anxious.     Blood pressure 113/71, pulse 75, temperature 98.5 F (36.9 C), temperature source Oral, resp. rate 18, height 5\' 7"  (1.702 m), weight 128.822 kg (284 lb), last menstrual period  09/06/2012, SpO2 100.00%.Body mass index is 44.47 kg/(m^2).  General Appearance: Casual  Eye Contact::  Minimal  Speech:  Normal Rate  Volume:  Increased  Mood:  Anxious and Depressed  Affect:  Labile  Thought Process:  Coherent  Orientation:  Full (Time, Place, and Person)  Thought Content:  WDL  Suicidal Thoughts:  No  Homicidal Thoughts:  No  Memory:  Immediate;   Fair Recent;   Fair Remote;   Fair  Judgement:  Fair  Insight:  Fair  Psychomotor Activity:  Decreased  Concentration:  Fair  Recall:  Fair  Akathisia:  No  Handed:  Right  AIMS (if indicated):     Assets:  Physical Health Resilience  Sleep:  Number of Hours: 6   Current Medications: Current Facility-Administered Medications  Medication Dose Route Frequency Provider Last Rate Last Dose  . 0.9 %  sodium chloride infusion   Intravenous Continuous Celene Kras, MD 125 mL/hr at 09/13/12 2121    . acetaminophen (TYLENOL) tablet 650 mg  650 mg Oral Q6H PRN Sanjuana Kava, NP   650 mg at 09/15/12 1320  . alum & mag hydroxide-simeth (MAALOX/MYLANTA) 200-200-20 MG/5ML suspension 30 mL  30 mL Oral Q4H PRN Sanjuana Kava, NP      . cloNIDine (CATAPRES) tablet 0.1 mg  0.1 mg Oral BH-qamhs Sanjuana Kava, NP       Followed by  . [START ON 09/17/2012] cloNIDine (CATAPRES) tablet 0.1 mg  0.1 mg Oral QAC breakfast Sanjuana Kava, NP      . dicyclomine (BENTYL) tablet 20 mg  20 mg Oral Q6H PRN Sanjuana Kava, NP      . DULoxetine (CYMBALTA) DR capsule 60  mg  60 mg Oral Daily Sanjuana Kava, NP   60 mg at 09/15/12 1610  . gabapentin (NEURONTIN) capsule 300 mg  300 mg Oral TID Nanine Means, NP   300 mg at 09/15/12 1149  . hydrOXYzine (ATARAX/VISTARIL) tablet 25 mg  25 mg Oral Q6H PRN Sanjuana Kava, NP   25 mg at 09/15/12 1319  . loperamide (IMODIUM) capsule 2-4 mg  2-4 mg Oral PRN Sanjuana Kava, NP      . magnesium hydroxide (MILK OF MAGNESIA) suspension 30 mL  30 mL Oral Daily PRN Sanjuana Kava, NP      . methocarbamol (ROBAXIN) tablet  500 mg  500 mg Oral TID Nanine Means, NP   500 mg at 09/15/12 1149  . nabumetone (RELAFEN) tablet 500 mg  500 mg Oral BID Nanine Means, NP   500 mg at 09/15/12 0735  . ondansetron (ZOFRAN-ODT) disintegrating tablet 4 mg  4 mg Oral Q6H PRN Sanjuana Kava, NP   4 mg at 09/15/12 1319  . topiramate (TOPAMAX) tablet 200 mg  200 mg Oral Daily Sanjuana Kava, NP   200 mg at 09/15/12 9604    Lab Results:  Results for orders placed during the hospital encounter of 09/12/12 (from the past 48 hour(s))  GLUCOSE, CAPILLARY     Status: None   Collection Time    09/13/12  7:36 PM      Result Value Range   Glucose-Capillary 85  70 - 99 mg/dL  CBC WITH DIFFERENTIAL     Status: Abnormal   Collection Time    09/13/12  8:57 PM      Result Value Range   WBC 8.6  4.0 - 10.5 K/uL   RBC 4.57  3.87 - 5.11 MIL/uL   Hemoglobin 12.4  12.0 - 15.0 g/dL   HCT 54.0  98.1 - 19.1 %   MCV 84.5  78.0 - 100.0 fL   MCH 27.1  26.0 - 34.0 pg   MCHC 32.1  30.0 - 36.0 g/dL   RDW 47.8  29.5 - 62.1 %   Platelets 210  150 - 400 K/uL   Neutrophils Relative 78 (*) 43 - 77 %   Neutro Abs 6.7  1.7 - 7.7 K/uL   Lymphocytes Relative 14  12 - 46 %   Lymphs Abs 1.2  0.7 - 4.0 K/uL   Monocytes Relative 7  3 - 12 %   Monocytes Absolute 0.6  0.1 - 1.0 K/uL   Eosinophils Relative 1  0 - 5 %   Eosinophils Absolute 0.1  0.0 - 0.7 K/uL   Basophils Relative 0  0 - 1 %   Basophils Absolute 0.0  0.0 - 0.1 K/uL  BASIC METABOLIC PANEL     Status: Abnormal   Collection Time    09/13/12  8:57 PM      Result Value Range   Sodium 138  135 - 145 mEq/L   Potassium 4.1  3.5 - 5.1 mEq/L   Chloride 107  96 - 112 mEq/L   CO2 21  19 - 32 mEq/L   Glucose, Bld 90  70 - 99 mg/dL   BUN 18  6 - 23 mg/dL   Creatinine, Ser 3.08 (*) 0.50 - 1.10 mg/dL   Calcium 8.6  8.4 - 65.7 mg/dL   GFR calc non Af Amer 59 (*) >90 mL/min   GFR calc Af Amer 68 (*) >90 mL/min   Comment:  The eGFR has been calculated     using the CKD EPI equation.     This  calculation has not been     validated in all clinical     situations.     eGFR's persistently     <90 mL/min signify     possible Chronic Kidney Disease.    Physical Findings: AIMS: Facial and Oral Movements Muscles of Facial Expression: None, normal Lips and Perioral Area: None, normal Jaw: None, normal Tongue: None, normal,Extremity Movements Upper (arms, wrists, hands, fingers): None, normal Lower (legs, knees, ankles, toes): None, normal, Trunk Movements Neck, shoulders, hips: None, normal, Overall Severity Severity of abnormal movements (highest score from questions above): None, normal Incapacitation due to abnormal movements: None, normal Patient's awareness of abnormal movements (rate only patient's report): No Awareness, Dental Status Current problems with teeth and/or dentures?: No Does patient usually wear dentures?: No  CIWA:  CIWA-Ar Total: 0 COWS:     Treatment Plan Summary: Daily contact with patient to assess and evaluate symptoms and progress in treatment Medication management  Plan:  Review of chart, vital signs, medications, and notes. 1-Individual and group therapy 2-Medication management for depression and anxiety:  Medications reviewed and no changes made---gabapentin, robaxin, and Relafen added yesterday to assist her pain 3-Coping skills for depression, anxiety, and pain 4-Continue crisis stabilization and management--staff continues positive support 5-Address health issues--monitoring vital signs, stable 6-Treatment plan in progress to prevent relapse of depression and anxiety  Medical Decision Making Problem Points:  Established problem, stable/improving (1) and Review of psycho-social stressors (1) Data Points:  Review of medication regiment & side effects (2)  I certify that inpatient services furnished can reasonably be expected to improve the patient's condition.   Nanine Means, PMH-NP 09/15/2012, 2:11 PM

## 2012-09-16 ENCOUNTER — Ambulatory Visit (HOSPITAL_COMMUNITY)
Admission: EM | Admit: 2012-09-16 | Discharge: 2012-09-16 | Disposition: A | Discharge status: Home / Self Care | Payer: Medicaid Other | Source: Other Acute Inpatient Hospital | Attending: Psychiatry | Admitting: Psychiatry

## 2012-09-16 NOTE — Progress Notes (Signed)
[facility] LCSW Group Therapy  09/16/2012 3:17 PM  Type of Therapy:  Group Therapy  Participation Level:  Active  Participation Quality:  Appropriate  Affect:  Appropriate, Depressed and Tearful  Cognitive:  Appropriate  Insight:  Engaged  Engagement in Therapy:  Engaged and Improving  Modes of Intervention:  Discussion, Exploration, Problem-solving, Rapport Building and Support  Summary of Progress/Problems:  Patient shared the obstacle she has to overcome is not taking her medications or following up with therapist.  Patient shared she is on the verge of being put out of parents home because of her failure to take care of herself.  Victoria Middleton 09/16/2012, 3:17 PM

## 2012-09-16 NOTE — Tx Team (Signed)
Interdisciplinary Treatment Plan Update   Date Reviewed:  09/16/2012  Time Reviewed:  10:18 AM  Progress in Treatment:   Attending groups: Yes Participating in groups: Yes, patient participated in group Taking medication as prescribed: Yes  Tolerating medication: Yes Family/Significant other contact made:  Contact made with father Patient understands diagnosis: Yes  Discussing patient identified problems/goals with staff: Yes Medical problems stabilized or resolved: Yes Denies suicidal/homicidal ideation: Yes Patient has not harmed self or others: Yes  For review of initial/current patient goals, please see plan of care.  Estimated Length of Stay:  One  days  Reasons for Continued Hospitalization:  Anxiety Depression Medication stabilization  New Problems/Goals identified:    Discharge Plan or Barriers:   Home with outpatient follow up in [facility]  Additional Comments:  Patient advised that father called on Friday and stated patient unable to return to the home.  Patient shared father visited last night but did not make that statement with her.  Patient shared her moods continue to be up and down.  She rates depression at four and all other symptoms at five/six.   Attendees:  Patient: Victoria Middleton 09/16/2012 10:18 AM   Signature: Patrick North, MD 09/16/2012 10:18 AM  Signature  Chinita Greenland, RN 09/16/2012 10:18 AM  Signature: Tennant Cellar, RN 09/16/2012 10:18 AM  Signature: 09/16/2012 10:18 AM  Signature:   09/16/2012 10:18 AM  Signature:  Juline Patch, LCSW 09/16/2012 10:18 AM  Signature: Silverio Decamp, PMH-NP 09/16/2012 10:18 AM  Signature:  09/16/2012 10:18 AM  Signature:    Signature:    Signature:    Signature:      Scribe for Treatment Team:   Juline Patch,  09/16/2012 10:18 AM

## 2012-09-16 NOTE — Progress Notes (Signed)
Writer spoke with patient at medication window when she received her scheduled relafan. Patient was happy to see her 2 children that came to visit today and shared her pictures with Clinical research associate. Patient c/o having neck pain and refused tylenol but was offered heat packs which she accepted. Patient has been in the dayroom watching tv interacting with peers. Patient reports that her goal is to stay on her medications and would like outpatient therapy. Patient offered support and encouragement . Patient denies si/hi/a/v hallucinations. Safety maintained on unit with 15 min checks will continue to monitor.

## 2012-09-16 NOTE — Progress Notes (Signed)
Adult Psychoeducational Group Note  Date:  09/16/2012 Time:  1:28 PM  Group Topic/Focus:  Self Care:   The focus of this group is to help patients understand the importance of self-care in order to improve or restore emotional, physical, spiritual, interpersonal, and financial health.  Participation Level:  Active  Participation Quality:  Appropriate and Attentive  Affect:  Appropriate  Cognitive:  Appropriate  Insight: Appropriate  Engagement in Group:  Engaged  Modes of Intervention:  Discussion  Additional Comments:  Pt was appropriate and sharing will attending group. Pt attended group the last 20 min of group. Pt was willing to have a discussion about having a social circle.   Sharyn Lull 09/16/2012, 1:28 PM

## 2012-09-16 NOTE — Progress Notes (Signed)
[facility] LCSW Aftercare Discharge Planning Group Note  09/16/2012 10:24 AM  Participation Quality:  Appropriate and Attentive  Affect:  Appropriate and Depressed  Cognitive:  Appropriate  Insight:  Engaged  Engagement in Group:  Engaged  Modes of Intervention:  Exploration, Problem-solving, Rapport Building and Support  Summary of Progress/Problems:  Patient denies SI/HI.  She rates depression at four and all other symptoms at five.  Patient shared she is having headaches and rates pain at six.  She stated headaches have increased since having last child.  Wynn Banker 09/16/2012, 10:24 AM

## 2012-09-16 NOTE — Progress Notes (Signed)
Fall assessment: Pt playing in gym at 1615 when she tripped over her feet and fell. Pt reported that she twisted her ankle as she was falling down. Pt fall was witnessed by Raynelle Jan who confirmed pt fall. Pt denies hitting her head during the incident. Pt pain to lt ankle 7/10. Pt ankle assessed, pt is able to motion her ankle plantar and dorsiflexion and able to motion her foot inversion. Pt expressed difficulty with eversion flexion of the foot. Pt do not have any other physical apparent injuries other than her lt ankle. Pt denies pain to head, back, legs, arms and  Wrist. Pt v/s sitting, 98.6, 118/79, 20, 103. V/s standing, B/p 109/79,  p 116.  MD Dan Humphreys made aware and ordered for pt to have Xray.
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
## 2012-09-16 NOTE — Progress Notes (Signed)
[facility] INPATIENT:  Family/Significant Other Suicide Prevention Education  Suicide Prevention Education:  Education Completed;David Stockton, Father, [phone], has been identified by the patient as the family member/significant other with whom the patient will be residing, and identified as the person(s) who will aid the patient in the event of a mental health crisis (suicidal ideations/suicide attempt).  With written consent from the patient, the family member/significant other has been provided the following suicide prevention education, prior to the and/or following the discharge of the patient.  The suicide prevention education provided includes the following:  Suicide risk factors  Suicide prevention and interventions  National Suicide Hotline telephone number  [facility] assessment telephone number  [facility] Emergency Assistance 911  [facility] and/or Residential Mobile Crisis Unit telephone number  Request made of family/significant other to:  Remove weapons (e.g., guns, rifles, knives), all items previously/currently identified as safety concern.  Father advised patient does not have access to guns.  Remove drugs/medications (over-the-counter, prescriptions, illicit drugs), all items previously/currently identified as a safety concern.  The family member/significant other verbalizes understanding of the suicide prevention education information provided.  The family member/significant other agrees to remove the items of safety concern listed above.  Wynn Banker 09/16/2012, 11:30 AM
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
## 2012-09-16 NOTE — Progress Notes (Signed)
D: Patient in the dayroom interacting with peers on approach.  Patient states today was ok but patient states she is in a lot of pain in her left ankle from a fall today.  Patient states she has taken tylenol and used an ice pack but that has not helped her much.  Patient has exhibited attention-seeking behaviors tonight by sitting in the floor in the hallway with her head down.  Writer asked patient what was wrong and patient stated she was dizzy.  Writer escorted patient to her room.  VS taken by MHT at this time VS with in normal limits.  Patient states she is going home tomorrow.  Patient states she has learned that she is going to take her medications and go to therapy when discharged.  Patient denies SI/HI and denies AVH. A: Staff to monitor Q 15 mins for safety.  Encouragement and support offered.  Scheduled medications administered per orders.  Tylenol administered for left ankle pain. R: Patient remains safe on the unit.  Patient attended group tonight.  Patient taking administered medications tonight.  Patient visible on the unit and interacting with peers.  Patient resting in bed with eyes closed on reassessment.

## 2012-09-16 NOTE — Progress Notes (Signed)
D- Victoria Middleton has been tearful and hopeless this morning related to discharge plans and the possibility of not being to go home.  SW informed her that father would allow her to come home.  Denies SI.  Compliant with scheduled medications.  A- PRN medications requested and received for anxiety. Support and firm,compassionate limits set.  Continue current POC and evaluation of treatment goals.Continue 15' checks for safety. R- Remains safe.

## 2012-09-16 NOTE — Progress Notes (Signed)
2 hour post fall assessment: Pt ankle assessed, pt is able to motion her ankle plantar and dorsiflexion and able to motion her foot inversion. Pt expressed difficulty with eversion flexion of the foot. Pt do not have any other physical apparent injuries other than her lt ankle. Pt denies pain to head, back, legs, arms and Wrist. Pt v/s sitting 110/74 80 18 97.9 and standing 116/81 93.  Pt pain 7/10

## 2012-09-17 MED ORDER — NABUMETONE 500 MG PO TABS: 500.0000 mg | ORAL_TABLET | Freq: Two times a day (BID) | ORAL | Status: DC

## 2012-09-17 MED ORDER — HYDROXYZINE HCL 25 MG PO TABS: 25.0000 mg | ORAL_TABLET | Freq: Four times a day (QID) | ORAL | Status: DC | PRN

## 2012-09-17 MED ORDER — TOPIRAMATE 100 MG PO TABS: 200.0000 mg | ORAL_TABLET | Freq: Every morning | ORAL | Status: DC

## 2012-09-17 MED ORDER — GABAPENTIN 300 MG PO CAPS: 300.0000 mg | ORAL_CAPSULE | Freq: Three times a day (TID) | ORAL | Status: DC

## 2012-09-17 MED ORDER — METHOCARBAMOL 500 MG PO TABS: 500.0000 mg | ORAL_TABLET | Freq: Three times a day (TID) | ORAL | Status: DC

## 2012-09-17 MED ORDER — DULOXETINE HCL 60 MG PO CPEP: 60.0000 mg | ORAL_CAPSULE | Freq: Every day | ORAL | Status: DC

## 2012-09-17 NOTE — Progress Notes (Signed)
Adult Psychoeducational Group Note  Date:  09/17/2012 Time:  1:27 PM  Group Topic/Focus:  Recovery Goals:   The focus of this group is to identify appropriate goals for recovery and establish a plan to achieve them.  Participation Level:  Active  Participation Quality:  Appropriate, Attentive and Sharing  Affect:  Appropriate  Cognitive:  Alert and Appropriate  Insight: Appropriate  Engagement in Group:  Engaged  Modes of Intervention:  Discussion  Additional Comments:  Pt was appropriate and attentive while attending group. Pt shared that she wants to get passed trauma and continue to work on her self-esteem. Pt stated that she plans to work on the obstacles by looking at herself in mirror and reminding herself that she is beautiful.   Sharyn Lull 09/17/2012, 1:27 PM

## 2012-09-17 NOTE — Progress Notes (Signed)
[facility] LCSW Aftercare Discharge Planning Group Note  09/17/2012 12:04 PM  Participation Quality:  Appropriate and Attentive  Affect:  Appropriate  Cognitive:  Appropriate  Insight:  Engaged  Engagement in Group:  Engaged  Modes of Intervention:  Exploration, Problem-solving, Rapport Building and Support  Summary of Progress/Problems:  Patient reports doing well and denies SI/HI.  She shared she is looking forward to discharge today but has some apprehension.  Patient rates depression at two and anxiety at three/four.   Wynn Banker 09/17/2012, 12:04 PM

## 2012-09-17 NOTE — [facility] Suicide Risk Assessment (Signed)
Suicide Risk Assessment  Discharge Assessment     Demographic Factors:  Female, caucasian, single  Mental Status Per Nursing Assessment::   On Admission:  Self-harm thoughts  Current Mental Status by Physician: Patient alert and oriented to 4. Denies aH/VH/SI/HI.  Loss Factors: Loss of significant relationship and Financial problems/change in socioeconomic status  Historical Factors: Impulsivity  Risk Reduction Factors:   Sense of responsibility to family, Living with another person, especially a relative, Positive social support and Positive coping skills or problem solving skills  Continued Clinical Symptoms:  Depression:   Recent sense of peace/wellbeing  Cognitive Features That Contribute To Risk:  Cognitively intact  Suicide Risk:  Minimal: No identifiable suicidal ideation.  Patients presenting with no risk factors but with morbid ruminations; may be classified as minimal risk based on the severity of the depressive symptoms  Discharge Diagnoses:   AXIS I:  Major Depression, Recurrent severe AXIS II:  Deferred AXIS III:   Past Medical History  Diagnosis Date  . Headache   . Seizures    AXIS IV:  occupational problems and other psychosocial or environmental problems AXIS V:  61-70 mild symptoms  Plan Of Care/Follow-up recommendations:  Activity:  as tolerated Diet:  regular Follow up with outpatient appointments.  Is patient on multiple antipsychotic therapies at discharge:  No   Has Patient had three or more failed trials of antipsychotic monotherapy by history:  No  Recommended Plan for Multiple Antipsychotic Therapies: NA  RAVI, HIMABINDU 09/17/2012, 11:08 AM

## 2012-09-17 NOTE — H&P (Signed)
Reviewed

## 2012-09-17 NOTE — Progress Notes (Signed)
[facility] Adult Case Management Discharge Plan :  Will you be returning to the same living situation after discharge: Yes,  Patient returning to parent's home. At discharge, do you have transportation home?:Yes,  Father to transport patient home. Do you have the ability to pay for your medications:Yes,  Patient has Medicaid.  Release of information consent forms completed and in the chart;  Patient's signature needed at discharge.  Patient to Follow up at: Follow-up Information   Follow up with [facility] On 09/20/2012. (You are scheduled to see Edmonia James at 9:30 on Friday, September 20, 2012)    Contact information:   [address] [place], [place]    [phone]      Patient denies SI/HI:   Yes,  Patient is not endorsing SI/HI or thoughts of self harm.    Safety Planning and Suicide Prevention discussed:  Yes,  Reviewed during aftercare groups.  Wynn Banker 09/17/2012, 12:32 PM

## 2012-09-17 NOTE — Progress Notes (Signed)
Grief and Loss Group  Patients discussed significant losses and grief in there lives.  Patients explored ways to cope effectively with loss and associated grief emotions.    Pt was active in group and attentive to group members as they shared through offering her personal experience to support group members.  Pt discussed the loss of her son's father and the mourning of his absence for herself and for her son in the future.  Pt transitioned from feeling hopeless to having hope and strength as she reframed her struggle as a source of opportunity.  Pt also discussed hopes for the future and ways to provide self-care as a way to continue to provide for others for whom she cares.    Victoria Middleton Counseling Intern [employer]

## 2012-09-17 NOTE — Progress Notes (Deleted)
[facility] INPATIENT:  Family/Significant Other Suicide Prevention Education  Suicide Prevention Education:  Education Completed;Faustino Congress, Family, L[employer] of [facility] has been identified by the patient as the family member/significant other with whom the patient will be residing, and identified as the person(s) who will aid the patient in the event of a mental health crisis (suicidal ideations/suicide attempt).  With written consent from the patient, the family member/significant other has been provided the following suicide prevention education, prior to the and/or following the discharge of the patient.  The suicide prevention education provided includes the following:  Suicide risk factors  Suicide prevention and interventions  National Suicide Hotline telephone number  [facility] assessment telephone number  [facility] Emergency Assistance 911  [facility] and/or Residential Mobile Crisis Unit telephone number  Request made of family/significant other to:  Remove weapons (e.g., guns, rifles, knives), all items previously/currently identified as safety concern.  Family member reports patient does not have guns.  Remove drugs/medications (over-the-counter, prescriptions, illicit drugs), all items previously/currently identified as a safety concern.  The family member/significant other verbalizes understanding of the suicide prevention education information provided.  The family member/significant other agrees to remove the items of safety concern listed above.  Wynn Banker 09/17/2012, 12:57 PM

## 2012-09-17 NOTE — Progress Notes (Signed)
D:  Patient discharged to home.  All belongings retrieved from room and from locker in the search room.  Patient denies depressive symptoms or suicidal thoughts at this time.  A:  Reviewed all discharge instructions, medications, and follow up care.   R:  Patient states she is ready to leave.  Verbalized understanding of all discharge teaching.  Escorted to the search room and to the front lobby.

## 2012-09-18 ENCOUNTER — Encounter (HOSPITAL_COMMUNITY): Payer: Self-pay | Admitting: Emergency Medicine
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
## 2012-09-20 NOTE — Progress Notes (Signed)
Patient Discharge Instructions:  After Visit Summary (AVS):   Faxed to:  09/20/12 Discharge Summary Note:   Faxed to:  09/20/12 Psychiatric Admission Assessment Note:   Faxed to:  09/20/12 Suicide Risk Assessment - Discharge Assessment:   Faxed to:  09/20/12 Faxed/Sent to the Next Level Care provider:  09/20/12 Faxed to [facility] @ [phone]  Jerelene Redden, 09/20/2012, 3:42 PM

## 2012-09-24 NOTE — Discharge Summary (Signed)
Reviewed

## 2014-05-18 ENCOUNTER — Encounter (HOSPITAL_COMMUNITY): Payer: Self-pay | Admitting: Emergency Medicine

## 2016-01-24 DIAGNOSIS — G43711 Chronic migraine without aura, intractable, with status migrainosus: Secondary | ICD-10-CM | POA: Insufficient documentation

## 2016-04-13 ENCOUNTER — Encounter (HOSPITAL_COMMUNITY): Payer: Self-pay | Admitting: Emergency Medicine

## 2016-04-13 DIAGNOSIS — R1032 Left lower quadrant pain: Secondary | ICD-10-CM | POA: Insufficient documentation

## 2016-04-13 NOTE — ED Triage Notes (Signed)
Pt. reports LLQ pain radiating to lower back onset this week with nausea and emesis , denies diarrhea , no fever or chills.

## 2016-04-14 ENCOUNTER — Emergency Department (HOSPITAL_COMMUNITY): Payer: Self-pay

## 2016-04-14 ENCOUNTER — Emergency Department (HOSPITAL_COMMUNITY)
Admission: EM | Admit: 2016-04-14 | Discharge: 2016-04-14 | Disposition: A | Discharge status: Home / Self Care | Payer: Self-pay | Attending: Emergency Medicine | Admitting: Emergency Medicine

## 2016-04-14 DIAGNOSIS — R1032 Left lower quadrant pain: Secondary | ICD-10-CM

## 2016-04-14 DIAGNOSIS — R109 Unspecified abdominal pain: Secondary | ICD-10-CM

## 2016-04-14 LAB — URINALYSIS, ROUTINE W REFLEX MICROSCOPIC
GLUCOSE, UA: NEGATIVE mg/dL
KETONES UR: NEGATIVE mg/dL
Nitrite: NEGATIVE
PROTEIN: NEGATIVE mg/dL
Specific Gravity, Urine: 1.027 (ref 1.005–1.030)
pH: 6 (ref 5.0–8.0)

## 2016-04-14 LAB — COMPREHENSIVE METABOLIC PANEL
ALK PHOS: 58 U/L (ref 38–126)
ALT: 13 U/L — AB (ref 14–54)
AST: 18 U/L (ref 15–41)
Albumin: 4.3 g/dL (ref 3.5–5.0)
Anion gap: 8 (ref 5–15)
BUN: 13 mg/dL (ref 6–20)
CALCIUM: 9.1 mg/dL (ref 8.9–10.3)
CHLORIDE: 107 mmol/L (ref 101–111)
CO2: 23 mmol/L (ref 22–32)
CREATININE: 1.08 mg/dL — AB (ref 0.44–1.00)
GFR calc non Af Amer: >60 mL/min (ref >60)
GLUCOSE: 117 mg/dL — AB (ref 65–99)
Potassium: 3.9 mmol/L (ref 3.5–5.1)
SODIUM: 138 mmol/L (ref 135–145)
Total Bilirubin: 0.7 mg/dL (ref 0.3–1.2)
Total Protein: 7.2 g/dL (ref 6.5–8.1)

## 2016-04-14 LAB — CBC
HCT: 40 % (ref 36.0–46.0)
Hemoglobin: 13.1 g/dL (ref 12.0–15.0)
MCH: 28 pg (ref 26.0–34.0)
MCHC: 32.8 g/dL (ref 30.0–36.0)
MCV: 85.5 fL (ref 78.0–100.0)
PLATELETS: 214 10*3/uL (ref 150–400)
RBC: 4.68 MIL/uL (ref 3.87–5.11)
RDW: 13 % (ref 11.5–15.5)
WBC: 9.3 10*3/uL (ref 4.0–10.5)

## 2016-04-14 LAB — WET PREP, GENITAL
CLUE CELLS WET PREP: NONE SEEN
Sperm: NONE SEEN
Trich, Wet Prep: NONE SEEN
Yeast Wet Prep HPF POC: NONE SEEN

## 2016-04-14 LAB — URINE MICROSCOPIC-ADD ON

## 2016-04-14 LAB — GC/CHLAMYDIA PROBE AMP (~~LOC~~) NOT AT ARMC
Chlamydia: NEGATIVE
Neisseria Gonorrhea: NEGATIVE

## 2016-04-14 LAB — PREGNANCY, URINE: Preg Test, Ur: NEGATIVE

## 2016-04-14 LAB — LIPASE, BLOOD: Lipase: 34 U/L (ref 11–51)

## 2016-04-14 MED ORDER — SODIUM CHLORIDE 0.9 % IV SOLN
INTRAVENOUS | Status: DC
  Administered 2016-04-14: 04:00:00 via INTRAVENOUS

## 2016-04-14 MED ORDER — IBUPROFEN 800 MG PO TABS: 800.0000 mg | ORAL_TABLET | Freq: Four times a day (QID) | ORAL | 0 refills | Status: DC | PRN

## 2016-04-14 MED ORDER — ONDANSETRON HCL 4 MG/2ML IJ SOLN
4.0000 mg | Freq: Once | INTRAMUSCULAR | Status: AC
  Administered 2016-04-14: 4 mg via INTRAVENOUS

## 2016-04-14 MED ORDER — HYDROMORPHONE HCL 1 MG/ML IJ SOLN
1.0000 mg | Freq: Once | INTRAMUSCULAR | Status: AC
  Administered 2016-04-14: 1 mg via INTRAVENOUS
  Filled 2016-04-14: qty 1

## 2016-04-14 MED ORDER — ONDANSETRON HCL 4 MG/2ML IJ SOLN
4.0000 mg | Freq: Once | INTRAMUSCULAR | Status: AC
  Administered 2016-04-14: 4 mg via INTRAVENOUS
  Filled 2016-04-14: qty 2

## 2016-04-14 MED ORDER — IBUPROFEN 800 MG PO TABS
800.0000 mg | ORAL_TABLET | Freq: Once | ORAL | Status: AC
  Administered 2016-04-14: 800 mg via ORAL
  Filled 2016-04-14: qty 1

## 2016-04-14 NOTE — ED Notes (Signed)
Family at bedside. 

## 2016-04-14 NOTE — ED Notes (Signed)
Pt changed into hospital gown and warm blanket provided

## 2016-04-14 NOTE — ED Notes (Signed)
Patient transported to CT 

## 2016-04-14 NOTE — ED Notes (Signed)
Lab called to verify results of urine preg. No results at this time

## 2016-04-14 NOTE — ED Notes (Signed)
Main lab called to request urine preg add on

## 2016-04-14 NOTE — ED Provider Notes (Signed)
[facility]-EMERGENCY DEPT Provider Note   CSN: [id number] Arrival date & time: 04/13/16  2302  By signing my name below, I, Victoria Middleton, attest that this documentation has been prepared under the direction and in the presence of Arby Barrette, MD . Electronically Signed: Majel Middleton, Scribe. 04/14/2016. 3:05 AM.  History   Chief Complaint Chief Complaint  Patient presents with  . Abdominal Pain   The history is provided by the patient. No language interpreter was used.   HPI Comments: Victoria Middleton is a 35 y.o. female who presents to the Emergency Department complaining of gradually worsening, left lower back and flank pain that began 5 days ago and suddenly worsened yesterday evening. Pt reports her pain radiates into the LLQ of her abdomen. She states associated nausea and 4 episodes of vomiting yesterday. She notes she has taken ibuprofen with no relief. Pt reports hx of kidney stones "a few years ago" and states her current symptoms feel similar. She states she passed the stones on her own and did not require surgery. Pt denies dysuria, hematuria, fever, chills, cough, shortness of breath, and weakness or numbness in her BLE. She is currently having a normal menses.  Past Medical History:  Diagnosis Date  . Depression   . Headache(784.0)   . Kidney stones   . Migraine   . Seizures [facility])    Patient Active Problem List   Diagnosis Date Noted  . Major depressive disorder, recurrent episode, moderate (HCC) 09/13/2012   Past Surgical History:  Procedure Laterality Date  . ANKLE SURGERY     left  . APPENDECTOMY    . CHOLECYSTECTOMY    . KNEE SURGERY    . TONSILLECTOMY      OB History    Gravida Para Term Preterm AB Living   1             SAB TAB Ectopic Multiple Live Births                 Home Medications    Prior to Admission medications   Medication Sig Start Date End Date Taking? Authorizing Provider  butalbital-acetaminophen-caffeine (FIORICET, ESGIC) 50-325-40 MG per  tablet Take 1 tablet by mouth every 6 (six) hours as needed for headache.   Yes Historical Provider, MD  levonorgestrel (MIRENA) 20 MCG/24HR IUD 1 each by Intrauterine route once.   Yes Historical Provider, MD  promethazine (PHENERGAN) 25 MG tablet Take 25 mg by mouth every 6 (six) hours as needed for nausea or vomiting.    Yes Historical Provider, MD  propranolol (INDERAL) 10 MG tablet Take 10 mg by mouth 2 (two) times daily.   Yes Historical Provider, MD    Family History No family history on file.  Social History Social History  Substance Use Topics  . Smoking status: Never Smoker  . Smokeless tobacco: Never Used  . Alcohol use No   Allergies   Aripiprazole; Benadryl [diphenhydramine hcl]; Abilify [aripiprazole]; Ketorolac tromethamine; Naprosyn [naproxen]; Penicillins; Penicillins; Sulfa antibiotics; Sulfa antibiotics; and Toradol [ketorolac tromethamine]  Review of Systems Review of Systems 10 systems reviewed and all are negative for acute change except as noted in the HPI.  Physical Exam Updated Vital Signs BP 124/72 (BP Location: Left Arm)   Pulse 67   Temp 98.6 F (37 C) (Oral)   Resp 18   Wt 284 lb 4.8 oz (129 kg)   LMP 04/12/2016   SpO2 100%   BMI 44.53 kg/m   Physical Exam  Constitutional: She  is oriented to person, place, and time. She appears well-developed and well-nourished. No distress.  HENT:  Head: Normocephalic and atraumatic.  Eyes: EOM are normal.  Neck: Normal range of motion.  Cardiovascular: Normal rate, regular rhythm and normal heart sounds.   Pulmonary/Chest: Effort normal and breath sounds normal.  Abdominal: Soft. She exhibits no distension. There is tenderness. There is no guarding.  Very mild LLQ pain without guarding. No CVA tenderness with percussion.   Genitourinary: Vagina normal.  Genitourinary Comments: Cervix has visible IUD strings. Cervix does not appear friable. There is a small amount of blood in the cervical opening but no  large clot or active bleeding. No pooling blood. Bimanual examination is for moderate to severe left adnexal tenderness. I cannot appreciate mass or fullness. Right adnexa is nontender. A she does not endorse significant pain to cervical motion.  Musculoskeletal: Normal range of motion.  Neurological: She is alert and oriented to person, place, and time.  Skin: Skin is warm and dry.  Psychiatric: She has a normal mood and affect. Judgment normal.  Nursing note and vitals reviewed.  ED Treatments / Results  Labs (all labs ordered are listed, but only abnormal results are displayed) Labs Reviewed  WET PREP, GENITAL - Abnormal; Notable for the following:       Result Value   WBC, Wet Prep HPF POC MANY (*)    All other components within normal limits  COMPREHENSIVE METABOLIC PANEL - Abnormal; Notable for the following:    Glucose, Bld 117 (*)    Creatinine, Ser 1.08 (*)    ALT 13 (*)    All other components within normal limits  URINALYSIS, ROUTINE W REFLEX MICROSCOPIC (NOT AT [facility]) - Abnormal; Notable for the following:    Color, Urine AMBER (*)    APPearance CLOUDY (*)    Hgb urine dipstick LARGE (*)    Bilirubin Urine SMALL (*)    Leukocytes, UA SMALL (*)    All other components within normal limits  URINE MICROSCOPIC-ADD ON - Abnormal; Notable for the following:    Squamous Epithelial / LPF 0-5 (*)    Bacteria, UA RARE (*)    All other components within normal limits  LIPASE, BLOOD  CBC  PREGNANCY, URINE  POC URINE PREG, ED  GC/CHLAMYDIA PROBE AMP ([place]) NOT AT [facility]    EKG  EKG Interpretation None       Radiology Ct Renal Stone Study  Result Date: 04/14/2016 CLINICAL DATA:  LEFT flank pain for 5 days, becoming severe last night. History of renal stones. EXAM: CT ABDOMEN AND PELVIS WITHOUT CONTRAST TECHNIQUE: Multidetector CT imaging of the abdomen and pelvis was performed following the standard protocol without IV contrast. COMPARISON:  CT abdomen and pelvis  May 26, 2013 FINDINGS: LOWER CHEST: Lung bases are clear. The visualized heart size is normal. No pericardial effusion. HEPATOBILIARY: The road normal.  Status post cholecystectomy. PANCREAS: Normal. SPLEEN: Normal. ADRENALS/URINARY TRACT: Kidneys are orthotopic, demonstrating normal size and morphology. No nephrolithiasis, hydronephrosis; limited assessment for renal masses on this nonenhanced examination. The unopacified ureters are normal in course and caliber. Urinary bladder is well distended and unremarkable. Normal adrenal glands. STOMACH/BOWEL: The stomach, small and large bowel are normal in course and caliber without inflammatory changes, the sensitivity may be decreased by lack of enteric contrast. Small duodenal diverticulum again noted. The appendix is not discretely identified, however there are no inflammatory changes in the right lower quadrant. VASCULAR/LYMPHATIC: Aortoiliac vessels are normal in course and  caliber. No lymphadenopathy by CT size criteria. REPRODUCTIVE: IUD inferiorly migrated to lower uterine segment. OTHER: No intraperitoneal free fluid or free air. MUSCULOSKELETAL: Non-acute. RIGHT lateral abdominal wall herniorrhaphy. Scattered old Schmorl's node. IMPRESSION: No urolithiasis, obstructive uropathy nor acute intra-abdominal/ pelvic process. IUD inferiorly positioned in the lower uterine segment. Recommend consultation with OB/GYN on a nonemergent basis. Electronically Signed   By: Awilda Metroourtnay  Bloomer M.D.   On: 04/14/2016 06:22    Procedures Procedures (including critical care time)  Medications Ordered in ED Medications  0.9 %  sodium chloride infusion ( Intravenous Stopped 04/14/16 0454)  HYDROmorphone (DILAUDID) injection 1 mg (1 mg Intravenous Given 04/14/16 0340)  ondansetron (ZOFRAN) injection 4 mg (4 mg Intravenous Given 04/14/16 0345)  HYDROmorphone (DILAUDID) injection 1 mg (1 mg Intravenous Given 04/14/16 0459)  ibuprofen (ADVIL,MOTRIN) tablet 800 mg (800 mg  Oral Given 04/14/16 0700)    DIAGNOSTIC STUDIES:  Oxygen Saturation is 100% on RA, normal by my interpretation.    COORDINATION OF CARE:  2:57 AM Discussed treatment plan with pt at bedside and pt agreed to plan.  Initial Impression / Assessment and Plan / ED Course  I have reviewed the triage vital signs and the nursing notes.  Pertinent labs & imaging results that were available during my care of the patient were reviewed by me and considered in my medical decision making (see chart for details).  Clinical Course    I personally performed the services described in this documentation, which was scribed in my presence. The recorded information has been reviewed and is accurate.   Final Clinical Impressions(s) / ED Diagnoses   Final diagnoses:  LLQ pain   The patient present with severe left lower quadrant pain. CT does not show kidney stone or acute pathology. Pelvic exam was also performed and pelvic ultrasound to rule out torsion. Patient will need follow-up GYN evaluation. Patient is cleared for treatment for pain and outpatient follow-up with GYN. New Prescriptions New Prescriptions   No medications on file     Arby BarretteMarcy Shizuye Rupert, MD 04/23/16 [phone]

## 2016-07-17 HISTORY — PX: ANTERIOR CERVICAL DECOMP/DISCECTOMY FUSION: SHX1161

## 2016-08-24 DIAGNOSIS — G40909 Epilepsy, unspecified, not intractable, without status epilepticus: Secondary | ICD-10-CM

## 2016-10-19 ENCOUNTER — Emergency Department (HOSPITAL_COMMUNITY)
Admission: EM | Admit: 2016-10-19 | Discharge: 2016-10-20 | Disposition: A | Discharge status: Home / Self Care | Payer: Self-pay | Attending: Emergency Medicine | Admitting: Emergency Medicine

## 2016-10-19 ENCOUNTER — Emergency Department (HOSPITAL_COMMUNITY): Payer: Self-pay

## 2016-10-19 ENCOUNTER — Encounter (HOSPITAL_COMMUNITY): Payer: Self-pay

## 2016-10-19 DIAGNOSIS — R4020 Unspecified coma: Secondary | ICD-10-CM

## 2016-10-19 DIAGNOSIS — R55 Syncope and collapse: Secondary | ICD-10-CM | POA: Insufficient documentation

## 2016-10-19 DIAGNOSIS — Z79899 Other long term (current) drug therapy: Secondary | ICD-10-CM | POA: Insufficient documentation

## 2016-10-19 LAB — CBC WITH DIFFERENTIAL/PLATELET
BASOS ABS: 0 10*3/uL (ref 0.0–0.1)
Basophils Relative: 0 %
Eosinophils Absolute: 0.1 10*3/uL (ref 0.0–0.7)
Eosinophils Relative: 2 %
HEMATOCRIT: 37.9 % (ref 36.0–46.0)
HEMOGLOBIN: 12.9 g/dL (ref 12.0–15.0)
LYMPHS ABS: 1.3 10*3/uL (ref 0.7–4.0)
LYMPHS PCT: 17 %
MCH: 29.2 pg (ref 26.0–34.0)
MCHC: 34 g/dL (ref 30.0–36.0)
MCV: 85.7 fL (ref 78.0–100.0)
Monocytes Absolute: 0.5 10*3/uL (ref 0.1–1.0)
Monocytes Relative: 7 %
NEUTROS ABS: 5.7 10*3/uL (ref 1.7–7.7)
NEUTROS PCT: 74 %
Platelets: 204 10*3/uL (ref 150–400)
RBC: 4.42 MIL/uL (ref 3.87–5.11)
RDW: 13.3 % (ref 11.5–15.5)
WBC: 7.6 10*3/uL (ref 4.0–10.5)

## 2016-10-19 LAB — BASIC METABOLIC PANEL
ANION GAP: 6 (ref 5–15)
BUN: 13 mg/dL (ref 6–20)
CHLORIDE: 108 mmol/L (ref 101–111)
CO2: 24 mmol/L (ref 22–32)
Calcium: 8.9 mg/dL (ref 8.9–10.3)
Creatinine, Ser: 0.88 mg/dL (ref 0.44–1.00)
Glucose, Bld: 109 mg/dL — ABNORMAL HIGH (ref 65–99)
POTASSIUM: 3.5 mmol/L (ref 3.5–5.1)
SODIUM: 138 mmol/L (ref 135–145)

## 2016-10-19 LAB — I-STAT TROPONIN, ED: Troponin i, poc: 0 ng/mL (ref 0.00–0.08)

## 2016-10-19 MED ORDER — FENTANYL CITRATE (PF) 100 MCG/2ML IJ SOLN
50.0000 ug | Freq: Once | INTRAMUSCULAR | Status: AC | PRN
  Administered 2016-10-19: 50 ug via INTRAVENOUS
  Filled 2016-10-19: qty 2

## 2016-10-19 MED ORDER — SODIUM CHLORIDE 0.9 % IV BOLUS (SEPSIS)
1000.0000 mL | Freq: Once | INTRAVENOUS | Status: AC
  Administered 2016-10-19: 1000 mL via INTRAVENOUS

## 2016-10-19 MED ORDER — ONDANSETRON HCL 4 MG/2ML IJ SOLN
4.0000 mg | Freq: Once | INTRAMUSCULAR | Status: AC
  Administered 2016-10-19: 4 mg via INTRAVENOUS
  Filled 2016-10-19: qty 2

## 2016-10-19 NOTE — ED Provider Notes (Signed)
[facility]-EMERGENCY DEPT Provider Note   CSN: [id number] Arrival date & time: 10/19/16  2116  By signing my name below, I, Victoria Middleton, attest that this documentation has been prepared under the direction and in the presence of [facility], PA-C. Electronically Signed: Linna Middleton, Scribe. 10/19/2016. 9:44 PM.  History   Chief Complaint Chief Complaint  Patient presents with  . Fall  . Loss of Consciousness    The history is provided by the patient and a significant other. No language interpreter was used.     HPI Comments: Victoria Middleton is a 36 y.o. female with PMHx including seizures on Keppra who presents to the Emergency Department via EMS complaining of constant neck pain and a posterior headache s/p falling and losing consciousness shortly PTA. She states she was exiting a vehicle and lost consciousness for about 10 minutes. Patient reports that when she regained consciousness she was laying on the ground and there were people standing over her. Pt notes her fiance witnessed the event. She states she felt "off" throughout the day today prior to her syncopal episode. She reports some persistent nausea since the fall and endorses pain exacerbation with applied pressure to her neck. Patient notes she had spinal fusion surgery d/t herniated discs in her neck <1 month ago. She is on Keppra for seizures and last took this medication this morning. Her last seizure occurred three months ago. No recent illnesses. No h/o heart problems. Pt's father had a MI in his 60's and she denies any other pertinent cardiac FMHx. She denies dysuria, cough, abdominal pain, vomiting, chest pain, SOB, or any other associated symptoms.  11:05 PM: Fiance now at bedside, further history obtained. He states patient exited the vehicle, took two steps, and fell. He did not notice any shaking or seizure-like activity. No incontinence or drooling. He states patient was unresponsive on the ground for 3 minutes and started  hitting and fighting upon regaining consciousness. He reports patient returned to her normal baseline mental status 15 minutes later.   Past Medical History:  Diagnosis Date  . Depression   . Headache(784.0)   . Kidney stones   . Migraine   . Seizures [facility])     Patient Active Problem List   Diagnosis Date Noted  . Major depressive disorder, recurrent episode, moderate (HCC) 09/13/2012    Past Surgical History:  Procedure Laterality Date  . ANKLE SURGERY     left  . APPENDECTOMY    . CHOLECYSTECTOMY    . KNEE SURGERY    . TONSILLECTOMY      OB History    Gravida Para Term Preterm AB Living   1             SAB TAB Ectopic Multiple Live Births                   Home Medications    Prior to Admission medications   Medication Sig Start Date End Date Taking? Authorizing Provider  levETIRAcetam (KEPPRA) 500 MG tablet Take 500 mg by mouth 2 (two) times daily. 08/07/16  Yes Historical Provider, MD  propranolol (INDERAL) 20 MG tablet Take 20 mg by mouth 2 (two) times daily. 08/07/16 02/03/17 Yes Historical Provider, MD  ibuprofen (ADVIL,MOTRIN) 800 MG tablet Take 1 tablet (800 mg total) by mouth every 6 (six) hours as needed for moderate pain. Patient not taking: Reported on 10/19/2016 04/14/16   Lyndal Pulley, MD  levonorgestrel (MIRENA) 20 MCG/24HR IUD 1 each by Intrauterine route  once.    Historical Provider, MD    Family History History reviewed. No pertinent family history.  Social History Social History  Substance Use Topics  . Smoking status: Never Smoker  . Smokeless tobacco: Never Used  . Alcohol use No     Allergies   Aripiprazole; Benadryl [diphenhydramine hcl]; Abilify [aripiprazole]; Ketorolac tromethamine; Naprosyn [naproxen]; Penicillins; Penicillins; Sulfa antibiotics; Sulfa antibiotics; and Toradol [ketorolac tromethamine]   Review of Systems Review of Systems  Respiratory: Negative for cough and shortness of breath.   Cardiovascular: Negative for  chest pain.  Gastrointestinal: Positive for nausea. Negative for abdominal pain and vomiting.  Genitourinary: Negative for dysuria.  Musculoskeletal: Positive for neck pain.  Neurological: Positive for syncope and headaches.  All other systems reviewed and are negative.  Physical Exam Updated Vital Signs BP 139/74 (BP Location: Right Arm)   Pulse 75   Temp 98.4 F (36.9 C) (Oral)   Resp 18   Ht  (1.727 m)   Wt 127 kg   SpO2 100%   BMI 42.57 kg/m   Physical Exam  Constitutional: She is oriented to person, place, and time. She appears well-developed and well-nourished. No distress. Cervical collar in place.  HENT:  Head: Normocephalic and atraumatic. Head is without raccoon's eyes and without Battle's sign.  Nose: Nose normal.  Mouth/Throat: Oropharynx is clear and moist.  Neck:  C-collar in place. Midline cervical and right paraspinal tenderness.  Cardiovascular: Normal rate, regular rhythm, normal heart sounds and intact distal pulses.   No murmur heard. Good pulses.  Pulmonary/Chest: Effort normal and breath sounds normal. No respiratory distress. She has no wheezes. She has no rales.  Abdominal: Soft. She exhibits no distension. There is no tenderness.  Musculoskeletal:  Straight leg raises negative bilaterally. No T or L spine tenderness.   Neurological: She is alert and oriented to person, place, and time.  Alert, oriented, thought content appropriate, able to give a coherent history. Speech is clear and goal oriented, able to follow commands.  Cranial Nerves:  II:  Peripheral visual fields grossly normal, pupils equal, round, reactive to light III, IV, VI: EOM intact bilaterally, ptosis not present V,VII: smile symmetric, eyes kept closed tightly against resistance, facial light touch sensation equal VIII: hearing grossly normal IX, X: symmetric soft palate movement, uvula elevates symmetrically  XI: bilateral shoulder shrug symmetric and strong XII: midline  tongue extension 5/5 muscle strength in upper and lower extremities bilaterally including strong and equal grip strength and dorsiflexion/plantar flexion Sensory to light touch normal in all four extremities.  Normal finger-to-nose and rapid alternating movements; Negative romberg, no pronator drift.  Skin: Skin is warm and dry.  Nursing note and vitals reviewed.  ED Treatments / Results  Labs (all labs ordered are listed, but only abnormal results are displayed) Labs Reviewed  BASIC METABOLIC PANEL - Abnormal; Notable for the following:       Result Value   Glucose, Bld 109 (*)    All other components within normal limits  CBG MONITORING, ED - Abnormal; Notable for the following:    Glucose-Capillary 102 (*)    All other components within normal limits  CBC WITH DIFFERENTIAL/PLATELET  I-STAT TROPOININ, ED    EKG  EKG Interpretation  Date/Time:  Friday October 20 2016 00:06:45 EDT Ventricular Rate:  77 PR Interval:    QRS Duration: 92 QT Interval:  487 QTC Calculation: 552 R Axis:   30 Text Interpretation:  Sinus rhythm Low voltage, precordial leads Nonspecific T  abnormalities, diffuse leads Prolonged QT interval No previous ECGs available Confirmed by Read Drivers  MD, Jonny Ruiz [phone]) on 10/20/2016 12:09:28 AM Also confirmed by Read Drivers  MD, Jonny Ruiz [phone]), editor [place]  CCT, BEVER[place] ([id number])  on 10/20/2016 6:53:25 AM       Radiology Ct Head Wo Contrast  Result Date: 10/19/2016 CLINICAL DATA:  10 minutes loss of consciousness while exiting a people today. Neck pain and posterior headache. History of seizures. EXAM: CT HEAD WITHOUT CONTRAST CT CERVICAL SPINE WITHOUT CONTRAST TECHNIQUE: Multidetector CT imaging of the head and cervical spine was performed following the standard protocol without intravenous contrast. Multiplanar CT image reconstructions of the cervical spine were also generated. COMPARISON:  CT HEAD April 06, 2013 FINDINGS: CT HEAD FINDINGS BRAIN: No intraparenchymal  hemorrhage, mass effect nor midline shift. The ventricles and sulci are normal. No acute large vascular territory infarcts. No abnormal extra-axial fluid collections. Basal cisterns are patent. VASCULAR: Unremarkable. SKULL/SOFT TISSUES: No skull fracture. No significant soft tissue swelling. ORBITS/SINUSES: The included ocular globes and orbital contents are normal.Status post FESS/ethmoidectomies. OTHER: None. CT CERVICAL SPINE FINDINGS ALIGNMENT: Maintained lordosis. Vertebral bodies in alignment. SKULL BASE AND VERTEBRAE: Cervical vertebral bodies and posterior elements are intact. Status post C5-6 ACDF with arthrodesis. No periprosthetic lucency. Intervertebral disc heights preserved. No destructive bony lesions. C1-2 articulation maintained. SOFT TISSUES AND SPINAL CANAL: Normal. DISC LEVELS: No significant osseous canal stenosis or neural foraminal narrowing. UPPER CHEST: Lung apices are clear. OTHER: None. IMPRESSION: CT HEAD: Normal. CT CERVICAL SPINE: No acute fracture or malalignment. C5-6 ACDF and arthrodesis. Electronically Signed   By: Awilda Metro M.D.   On: 10/19/2016 22:45   Ct Cervical Spine Wo Contrast  Result Date: 10/19/2016 CLINICAL DATA:  10 minutes loss of consciousness while exiting a people today. Neck pain and posterior headache. History of seizures. EXAM: CT HEAD WITHOUT CONTRAST CT CERVICAL SPINE WITHOUT CONTRAST TECHNIQUE: Multidetector CT imaging of the head and cervical spine was performed following the standard protocol without intravenous contrast. Multiplanar CT image reconstructions of the cervical spine were also generated. COMPARISON:  CT HEAD April 06, 2013 FINDINGS: CT HEAD FINDINGS BRAIN: No intraparenchymal hemorrhage, mass effect nor midline shift. The ventricles and sulci are normal. No acute large vascular territory infarcts. No abnormal extra-axial fluid collections. Basal cisterns are patent. VASCULAR: Unremarkable. SKULL/SOFT TISSUES: No skull fracture. No  significant soft tissue swelling. ORBITS/SINUSES: The included ocular globes and orbital contents are normal.Status post FESS/ethmoidectomies. OTHER: None. CT CERVICAL SPINE FINDINGS ALIGNMENT: Maintained lordosis. Vertebral bodies in alignment. SKULL BASE AND VERTEBRAE: Cervical vertebral bodies and posterior elements are intact. Status post C5-6 ACDF with arthrodesis. No periprosthetic lucency. Intervertebral disc heights preserved. No destructive bony lesions. C1-2 articulation maintained. SOFT TISSUES AND SPINAL CANAL: Normal. DISC LEVELS: No significant osseous canal stenosis or neural foraminal narrowing. UPPER CHEST: Lung apices are clear. OTHER: None. IMPRESSION: CT HEAD: Normal. CT CERVICAL SPINE: No acute fracture or malalignment. C5-6 ACDF and arthrodesis. Electronically Signed   By: Awilda Metro M.D.   On: 10/19/2016 22:45    Procedures Procedures (including critical care time)  DIAGNOSTIC STUDIES: Oxygen Saturation is 100% on RA, normal by my interpretation.    COORDINATION OF CARE: 9:55 PM Discussed treatment plan with pt at bedside and pt agreed to plan.  Medications Ordered in ED Medications  sodium chloride 0.9 % bolus 1,000 mL (0 mLs Intravenous Stopped 10/20/16 0142)  ondansetron (ZOFRAN) injection 4 mg (4 mg Intravenous Given 10/19/16 2326)  fentaNYL (SUBLIMAZE) injection 50 mcg (  50 mcg Intravenous Given 10/19/16 2323)  oxyCODONE-acetaminophen (PERCOCET/ROXICET) 5-325 MG per tablet 1 tablet (1 tablet Oral Given 10/20/16 0148)     Initial Impression / Assessment and Plan / ED Course  I have reviewed the triage vital signs and the nursing notes.  Pertinent labs & imaging results that were available during my care of the patient were reviewed by me and considered in my medical decision making (see chart for details).    Ailyne Aurther Loft is a 36 y.o. female who presents to ED for evaluation of syncopal episode just prior to arrival. Patient does have a history of seizures on  Keppra, however fianc, who is a Company secretary, witnessed event and states this was not consistent with her typical seizures. There was no incontinence, tongue biting, gargling noises or shaking. On exam, there are no focal neuro deficits. C-collar in place with midline tenderness. - CT head and cervical spine negative. Troponin negative. CBC and BMP reassuring.  VSS. EKG reviewed with attending, Dr. Donnald Garre, which does show prolonged QT at 487 as well as nonspecific T wave changes. Given EKG findings with no prior EKG to compare, recommended patient be admitted for serial troponins and serial EKGs, however patient states that she very much wants to go home. Patient has been ambulatory in the ED without feeling weak, lightheaded or dizzy. She states that she feels back to her baseline mental status. Strongly encouraged that she stay as I cannot rule out life-threatening arrhythmia as source of syncopal episode today, however she insists to go home. Spoke at length with her fianc who agrees to watch her and stay with her for the next 24 hours and return to ER for any concerning symptoms. Recommend a cardiology follow-up. They would like to go to [facility] cardiology and there information was provided. I also gave our cardiologist referral information. All questions were answered.  Patient seen by and discussed with Dr. Donnald Garre who agrees with treatment plan.    Final Clinical Impressions(s) / ED Diagnoses   Final diagnoses:  LOC (loss of consciousness) (HCC)  Syncope and collapse    New Prescriptions Discharge Medication List as of 10/20/2016  1:37 AM     I personally performed the services described in this documentation, which was scribed in my presence. The recorded information has been reviewed and is accurate.    Chase Picket Dawnya Grams, PA-C 10/20/16 0237    Chase Picket Dilon Lank, PA-C 10/20/16 0800    Arby Barrette, MD 10/26/16 2038

## 2016-10-20 LAB — CBG MONITORING, ED: Glucose-Capillary: 102 mg/dL — ABNORMAL HIGH (ref 65–99)

## 2016-10-20 MED ORDER — OXYCODONE-ACETAMINOPHEN 5-325 MG PO TABS
1.0000 | ORAL_TABLET | Freq: Once | ORAL | Status: AC
  Administered 2016-10-20: 1 via ORAL
  Filled 2016-10-20: qty 1

## 2016-10-20 NOTE — Discharge Instructions (Signed)
Please call the phone number listed for [facility] cardiology to schedule follow-up appointment. If for some reason they cannot get an appointment for you, please see our [facility] cardiology clinic which is also listed.  [facility] Division of Cardiology [address] [place], [place] [address]  Phone: [phone] Fax: [phone]   Please return to the emergency Department immediately if you experience any chest pain, shortness of breath, lightheadedness, and new or worsening symptoms, or any concerning symptoms.

## 2016-10-20 NOTE — ED Notes (Signed)
EKG given to EDP,Molpus,MD., for review. 

## 2016-10-22 ENCOUNTER — Encounter (HOSPITAL_COMMUNITY): Payer: Self-pay

## 2016-10-22 ENCOUNTER — Emergency Department (HOSPITAL_COMMUNITY): Payer: Self-pay

## 2016-10-22 DIAGNOSIS — R918 Other nonspecific abnormal finding of lung field: Secondary | ICD-10-CM | POA: Insufficient documentation

## 2016-10-22 DIAGNOSIS — R0602 Shortness of breath: Secondary | ICD-10-CM | POA: Insufficient documentation

## 2016-10-22 DIAGNOSIS — R0789 Other chest pain: Secondary | ICD-10-CM | POA: Insufficient documentation

## 2016-10-22 NOTE — ED Triage Notes (Signed)
Per EMS: Pt complaining of SOB x 1 day. Pt states worsening throughout day. EMS states wheezing throughout. Pt denies any recent fevers/illness. EMS gave  albuterol and 1 duoneb, (  albuterol total), and 125 solumedrol. Pt with no SOB at triage.

## 2016-10-23 ENCOUNTER — Emergency Department (HOSPITAL_COMMUNITY)
Admission: EM | Admit: 2016-10-23 | Discharge: 2016-10-23 | Disposition: A | Discharge status: Home / Self Care | Payer: Self-pay | Attending: Emergency Medicine | Admitting: Emergency Medicine

## 2016-10-23 ENCOUNTER — Emergency Department (HOSPITAL_COMMUNITY): Payer: Self-pay

## 2016-10-23 DIAGNOSIS — R0602 Shortness of breath: Secondary | ICD-10-CM

## 2016-10-23 DIAGNOSIS — R9389 Abnormal findings on diagnostic imaging of other specified body structures: Secondary | ICD-10-CM

## 2016-10-23 DIAGNOSIS — R079 Chest pain, unspecified: Secondary | ICD-10-CM

## 2016-10-23 LAB — COMPREHENSIVE METABOLIC PANEL
ALK PHOS: 66 U/L (ref 38–126)
ALT: 21 U/L (ref 14–54)
ANION GAP: 14 (ref 5–15)
AST: 36 U/L (ref 15–41)
Albumin: 4.3 g/dL (ref 3.5–5.0)
BILIRUBIN TOTAL: 0.5 mg/dL (ref 0.3–1.2)
BUN: 12 mg/dL (ref 6–20)
CALCIUM: 9.4 mg/dL (ref 8.9–10.3)
CO2: 21 mmol/L — ABNORMAL LOW (ref 22–32)
Chloride: 104 mmol/L (ref 101–111)
Creatinine, Ser: 1.13 mg/dL — ABNORMAL HIGH (ref 0.44–1.00)
GLUCOSE: 207 mg/dL — AB (ref 65–99)
POTASSIUM: 4.3 mmol/L (ref 3.5–5.1)
Sodium: 139 mmol/L (ref 135–145)
TOTAL PROTEIN: 7 g/dL (ref 6.5–8.1)

## 2016-10-23 LAB — CBC WITH DIFFERENTIAL/PLATELET
BASOS ABS: 0 10*3/uL (ref 0.0–0.1)
Basophils Relative: 0 %
EOS PCT: 0 %
Eosinophils Absolute: 0 10*3/uL (ref 0.0–0.7)
HEMATOCRIT: 38.1 % (ref 36.0–46.0)
Hemoglobin: 12.9 g/dL (ref 12.0–15.0)
LYMPHS ABS: 0.5 10*3/uL — AB (ref 0.7–4.0)
LYMPHS PCT: 4 %
MCH: 29.1 pg (ref 26.0–34.0)
MCHC: 33.9 g/dL (ref 30.0–36.0)
MCV: 85.8 fL (ref 78.0–100.0)
MONO ABS: 0.1 10*3/uL (ref 0.1–1.0)
Monocytes Relative: 1 %
NEUTROS ABS: 10.5 10*3/uL — AB (ref 1.7–7.7)
Neutrophils Relative %: 95 %
Platelets: 195 10*3/uL (ref 150–400)
RBC: 4.44 MIL/uL (ref 3.87–5.11)
RDW: 13.3 % (ref 11.5–15.5)
WBC: 11.1 10*3/uL — ABNORMAL HIGH (ref 4.0–10.5)

## 2016-10-23 LAB — BRAIN NATRIURETIC PEPTIDE: B Natriuretic Peptide: 16.7 pg/mL (ref 0.0–100.0)

## 2016-10-23 LAB — I-STAT BETA HCG BLOOD, ED (MC, WL, AP ONLY)

## 2016-10-23 LAB — TROPONIN I

## 2016-10-23 MED ORDER — ALBUTEROL SULFATE HFA 108 (90 BASE) MCG/ACT IN AERS
2.0000 | INHALATION_SPRAY | RESPIRATORY_TRACT | Status: DC | PRN
  Administered 2016-10-23: 2 via RESPIRATORY_TRACT
  Filled 2016-10-23: qty 6.7

## 2016-10-23 MED ORDER — IOPAMIDOL (ISOVUE-370) INJECTION 76%
INTRAVENOUS | Status: AC
  Administered 2016-10-23: 100 mL
  Filled 2016-10-23: qty 100

## 2016-10-23 MED ORDER — HYDROMORPHONE HCL 1 MG/ML IJ SOLN
0.5000 mg | Freq: Once | INTRAMUSCULAR | Status: AC
  Administered 2016-10-23: 0.5 mg via INTRAVENOUS
  Filled 2016-10-23: qty 1

## 2016-10-23 NOTE — ED Notes (Signed)
Shari PA at bedside   

## 2016-10-23 NOTE — ED Provider Notes (Signed)
[facility]-EMERGENCY DEPT Provider Note   CSN: [id number] Arrival date & time: 10/22/16  2033     History   Chief Complaint Chief Complaint  Patient presents with  . Shortness of Breath  . Cough    HPI Victoria Middleton is a 36 y.o. female.  Patient with a history of migraine, seizures, kidney stones presents with complaint of sudden onset SOB and chest discomfort described as center chest radiating to bilateral lateral lower ribs. No fever. She has had a cough and fiance reports audible wheezing at the time of onset. No history of asthma. She states she was given nebulizer x 2 by EMS and that this improved symptoms. No symptoms prior to this afternoon. No vomiting, headache, congestion. She is not a smoker.    The history is provided by the patient. No language interpreter was used.  Shortness of Breath  Associated symptoms include cough and chest pain. Pertinent negatives include no fever, no headaches, no sore throat, no vomiting, no abdominal pain and no rash.  Cough  Associated symptoms include chest pain and shortness of breath. Pertinent negatives include no chills, no headaches, no sore throat and no myalgias.    Past Medical History:  Diagnosis Date  . Depression   . Headache(784.0)   . Kidney stones   . Migraine   . Seizures [facility])     Patient Active Problem List   Diagnosis Date Noted  . Major depressive disorder, recurrent episode, moderate (HCC) 09/13/2012    Past Surgical History:  Procedure Laterality Date  . ANKLE SURGERY     left  . APPENDECTOMY    . CHOLECYSTECTOMY    . KNEE SURGERY    . TONSILLECTOMY      OB History    Gravida Para Term Preterm AB Living   1             SAB TAB Ectopic Multiple Live Births                   Home Medications    Prior to Admission medications   Medication Sig Start Date End Date Taking? Authorizing Provider  levETIRAcetam (KEPPRA) 500 MG tablet Take 500 mg by mouth 2 (two) times daily. 08/07/16  Yes Historical  Provider, MD  levonorgestrel (MIRENA) 20 MCG/24HR IUD 1 each by Intrauterine route once.   Yes Historical Provider, MD  propranolol (INDERAL) 20 MG tablet Take 20 mg by mouth 2 (two) times daily. 08/07/16 02/03/17 Yes Historical Provider, MD  ibuprofen (ADVIL,MOTRIN) 800 MG tablet Take 1 tablet (800 mg total) by mouth every 6 (six) hours as needed for moderate pain. Patient not taking: Reported on 10/23/2016 04/14/16   Lyndal Pulley, MD    Family History History reviewed. No pertinent family history.  Social History Social History  Substance Use Topics  . Smoking status: Never Smoker  . Smokeless tobacco: Never Used  . Alcohol use No     Allergies   Aripiprazole; Benadryl [diphenhydramine hcl]; Abilify [aripiprazole]; Ketorolac tromethamine; Naprosyn [naproxen]; Penicillins; Penicillins; Sulfa antibiotics; Sulfa antibiotics; and Toradol [ketorolac tromethamine]   Review of Systems Review of Systems  Constitutional: Negative for chills and fever.  HENT: Negative.  Negative for congestion, facial swelling, sore throat and trouble swallowing.   Respiratory: Positive for cough and shortness of breath.   Cardiovascular: Positive for chest pain.  Gastrointestinal: Negative.  Negative for abdominal pain and vomiting.  Musculoskeletal: Negative.  Negative for myalgias.  Skin: Negative.  Negative for rash.  Neurological: Negative.  Negative for light-headedness and headaches.     Physical Exam Updated Vital Signs BP (!) 102/53   Pulse 97   Temp 98.2 F (36.8 C) (Oral)   Resp 17   LMP 10/15/2016 (Approximate)   SpO2 99%   Physical Exam  Constitutional: She is oriented to person, place, and time. She appears well-developed and well-nourished.  HENT:  Head: Normocephalic.  Neck: Normal range of motion. Neck supple.  Cardiovascular: Regular rhythm.  Tachycardia present.   No murmur heard. Pulmonary/Chest: Effort normal and breath sounds normal. She has no wheezes. She has no rales.  She exhibits no tenderness.  Abdominal: Soft. Bowel sounds are normal. There is no tenderness. There is no rebound and no guarding.  Musculoskeletal: Normal range of motion.  Neurological: She is alert and oriented to person, place, and time.  Skin: Skin is warm and dry. No rash noted.  Psychiatric: She has a normal mood and affect.     ED Treatments / Results  Labs (all labs ordered are listed, but only abnormal results are displayed) Labs Reviewed - No data to display  EKG  EKG Interpretation None       Radiology Dg Chest 2 View  Result Date: 10/22/2016 CLINICAL DATA:  Chest pain, cough, and shortness of breath for 1 day. EXAM: CHEST  2 VIEW COMPARISON:  04/06/2013 FINDINGS: The heart size and mediastinal contours are within normal limits. Both lungs are clear. The visualized skeletal structures are unremarkable. Cervical spine fusion hardware noted . IMPRESSION: No active cardiopulmonary disease. Electronically Signed   By: Myles Rosenthal M.D.   On: 10/22/2016 21:40   Ct Head Wo Contrast  Result Date: 10/19/2016 CLINICAL DATA:  10 minutes loss of consciousness while exiting a people today. Neck pain and posterior headache. History of seizures. EXAM: CT HEAD WITHOUT CONTRAST CT CERVICAL SPINE WITHOUT CONTRAST TECHNIQUE: Multidetector CT imaging of the head and cervical spine was performed following the standard protocol without intravenous contrast. Multiplanar CT image reconstructions of the cervical spine were also generated. COMPARISON:  CT HEAD April 06, 2013 FINDINGS: CT HEAD FINDINGS BRAIN: No intraparenchymal hemorrhage, mass effect nor midline shift. The ventricles and sulci are normal. No acute large vascular territory infarcts. No abnormal extra-axial fluid collections. Basal cisterns are patent. VASCULAR: Unremarkable. SKULL/SOFT TISSUES: No skull fracture. No significant soft tissue swelling. ORBITS/SINUSES: The included ocular globes and orbital contents are normal.Status  post FESS/ethmoidectomies. OTHER: None. CT CERVICAL SPINE FINDINGS ALIGNMENT: Maintained lordosis. Vertebral bodies in alignment. SKULL BASE AND VERTEBRAE: Cervical vertebral bodies and posterior elements are intact. Status post C5-6 ACDF with arthrodesis. No periprosthetic lucency. Intervertebral disc heights preserved. No destructive bony lesions. C1-2 articulation maintained. SOFT TISSUES AND SPINAL CANAL: Normal. DISC LEVELS: No significant osseous canal stenosis or neural foraminal narrowing. UPPER CHEST: Lung apices are clear. OTHER: None. IMPRESSION: CT HEAD: Normal. CT CERVICAL SPINE: No acute fracture or malalignment. C5-6 ACDF and arthrodesis. Electronically Signed   By: Awilda Metro M.D.   On: 10/19/2016 22:45   Ct Angio Chest Pe W And/or Wo Contrast  Result Date: 10/23/2016 CLINICAL DATA:  Dyspnea and syncope EXAM: CT ANGIOGRAPHY CHEST WITH CONTRAST TECHNIQUE: Multidetector CT imaging of the chest was performed using the standard protocol during bolus administration of intravenous contrast. Multiplanar CT image reconstructions and MIPs were obtained to evaluate the vascular anatomy. CONTRAST:  80 cc Isovue 370 IV COMPARISON:  None. FINDINGS: Cardiovascular: The study is of quality for the evaluation of pulmonary embolism. There are no filling defects  in the central, lobar, segmental or subsegmental pulmonary artery branches to suggest acute pulmonary embolism. Great vessels are normal in course and caliber. Normal heart size. No significant pericardial fluid/thickening. Dilated main pulmonary artery to 3.9 cm. Mediastinum/Nodes: No discrete thyroid nodules. Unremarkable esophagus. No pathologically enlarged axillary, mediastinal or hilar lymph nodes. Lungs/Pleura: No pneumothorax. No pleural effusion. Mild motion degraded assessment of the lungs especially at the lung bases. No consolidation or dominant mass. No effusion or pneumothorax. Upper abdomen: Unremarkable. Musculoskeletal:  No  aggressive appearing focal osseous lesions. Review of the MIP images confirms the above findings. IMPRESSION: No acute cardiopulmonary disease. No pulmonary embolus identified. Dilated appearance of the main pulmonary artery to 3.9 cm likely reflects a component of pulmonary hypertension. Electronically Signed   By: Tollie Eth M.D.   On: 10/23/2016 03:41   Ct Cervical Spine Wo Contrast  Result Date: 10/19/2016 CLINICAL DATA:  10 minutes loss of consciousness while exiting a people today. Neck pain and posterior headache. History of seizures. EXAM: CT HEAD WITHOUT CONTRAST CT CERVICAL SPINE WITHOUT CONTRAST TECHNIQUE: Multidetector CT imaging of the head and cervical spine was performed following the standard protocol without intravenous contrast. Multiplanar CT image reconstructions of the cervical spine were also generated. COMPARISON:  CT HEAD April 06, 2013 FINDINGS: CT HEAD FINDINGS BRAIN: No intraparenchymal hemorrhage, mass effect nor midline shift. The ventricles and sulci are normal. No acute large vascular territory infarcts. No abnormal extra-axial fluid collections. Basal cisterns are patent. VASCULAR: Unremarkable. SKULL/SOFT TISSUES: No skull fracture. No significant soft tissue swelling. ORBITS/SINUSES: The included ocular globes and orbital contents are normal.Status post FESS/ethmoidectomies. OTHER: None. CT CERVICAL SPINE FINDINGS ALIGNMENT: Maintained lordosis. Vertebral bodies in alignment. SKULL BASE AND VERTEBRAE: Cervical vertebral bodies and posterior elements are intact. Status post C5-6 ACDF with arthrodesis. No periprosthetic lucency. Intervertebral disc heights preserved. No destructive bony lesions. C1-2 articulation maintained. SOFT TISSUES AND SPINAL CANAL: Normal. DISC LEVELS: No significant osseous canal stenosis or neural foraminal narrowing. UPPER CHEST: Lung apices are clear. OTHER: None. IMPRESSION: CT HEAD: Normal. CT CERVICAL SPINE: No acute fracture or malalignment.  C5-6 ACDF and arthrodesis. Electronically Signed   By: Awilda Metro M.D.   On: 10/19/2016 22:45    Procedures Procedures (including critical care time)  Medications Ordered in ED Medications - No data to display   Initial Impression / Assessment and Plan / ED Course  I have reviewed the triage vital signs and the nursing notes.  Pertinent labs & imaging results that were available during my care of the patient were reviewed by me and considered in my medical decision making (see chart for details).     Patient presents with SOB and chest pain that started around 3:00 yesterday afternoon (10/22/16) and has been persistent since onset.   Chart reviewed. She was seen and evaluated 2 days ago for syncopal event and had EKG changes. Recommendation at that time was to admit but the patient wanted to leave the hospital to go home. Fiance also reports that one month prior to that she was found in [place] unaware of how she got there. She was taken to [facility] where she was admitted due to EKG change of inverted T-wave and sent home restarted on Keppra. No seizure activity was witnessed or proven.  The patient states her breathing has been normalized since receiving the nebulizer treatments. CT chest is negative. EKG shows diffuse t-wave inversion, assumed to be unchanged.  Troponin is negative. CT does show changes of arterial  dilation, potentially pulmonary hypertension.  Discussed discharge home with the patient and fiance. Strict return precautions discussed. Importance of cardiology and pulmonary follow up also discussed. Patient states she has charity care through [facility] and will attempt to follow up there. Referrals are, however, provided for [place].   Final Clinical Impressions(s) / ED Diagnoses   Final diagnoses:  None   1. SOB 2. Nonspecific chest pain 3. Abnormal chest CT  New Prescriptions New Prescriptions   No medications on file     Elpidio Anis,  PA-C 10/23/16 0445    Melene Plan, DO 10/23/16 1610

## 2016-10-23 NOTE — Discharge Instructions (Signed)
Follow up with both cardiology (for chest pain to discuss ECHO) and pulmonology (for evaluation of shortness of breath and abnormal chest CT scan). Return to the emergency department with any worsening symptoms or new concerns.

## 2016-11-24 ENCOUNTER — Encounter (HOSPITAL_COMMUNITY): Payer: Self-pay | Admitting: Emergency Medicine

## 2016-11-24 ENCOUNTER — Inpatient Hospital Stay (HOSPITAL_COMMUNITY)
Admission: EM | Admit: 2016-11-24 | Discharge: 2016-11-28 | DRG: 918 | Payer: Medicaid Other | Attending: Internal Medicine | Admitting: Internal Medicine

## 2016-11-24 DIAGNOSIS — Z880 Allergy status to penicillin: Secondary | ICD-10-CM

## 2016-11-24 DIAGNOSIS — G40909 Epilepsy, unspecified, not intractable, without status epilepticus: Secondary | ICD-10-CM | POA: Diagnosis present

## 2016-11-24 DIAGNOSIS — T50902A Poisoning by unspecified drugs, medicaments and biological substances, intentional self-harm, initial encounter: Secondary | ICD-10-CM

## 2016-11-24 DIAGNOSIS — Z79899 Other long term (current) drug therapy: Secondary | ICD-10-CM

## 2016-11-24 DIAGNOSIS — T426X2A Poisoning by other antiepileptic and sedative-hypnotic drugs, intentional self-harm, initial encounter: Principal | ICD-10-CM | POA: Diagnosis present

## 2016-11-24 DIAGNOSIS — G43711 Chronic migraine without aura, intractable, with status migrainosus: Secondary | ICD-10-CM | POA: Diagnosis present

## 2016-11-24 DIAGNOSIS — Z981 Arthrodesis status: Secondary | ICD-10-CM

## 2016-11-24 DIAGNOSIS — R7989 Other specified abnormal findings of blood chemistry: Secondary | ICD-10-CM | POA: Diagnosis not present

## 2016-11-24 DIAGNOSIS — G43909 Migraine, unspecified, not intractable, without status migrainosus: Secondary | ICD-10-CM | POA: Diagnosis present

## 2016-11-24 DIAGNOSIS — R001 Bradycardia, unspecified: Secondary | ICD-10-CM | POA: Diagnosis present

## 2016-11-24 DIAGNOSIS — R45851 Suicidal ideations: Secondary | ICD-10-CM | POA: Diagnosis present

## 2016-11-24 DIAGNOSIS — M549 Dorsalgia, unspecified: Secondary | ICD-10-CM | POA: Diagnosis present

## 2016-11-24 DIAGNOSIS — T50901A Poisoning by unspecified drugs, medicaments and biological substances, accidental (unintentional), initial encounter: Secondary | ICD-10-CM | POA: Diagnosis present

## 2016-11-24 DIAGNOSIS — F331 Major depressive disorder, recurrent, moderate: Secondary | ICD-10-CM | POA: Diagnosis present

## 2016-11-24 DIAGNOSIS — N179 Acute kidney failure, unspecified: Secondary | ICD-10-CM | POA: Diagnosis present

## 2016-11-24 DIAGNOSIS — Z888 Allergy status to other drugs, medicaments and biological substances status: Secondary | ICD-10-CM

## 2016-11-24 DIAGNOSIS — G8929 Other chronic pain: Secondary | ICD-10-CM | POA: Diagnosis present

## 2016-11-24 DIAGNOSIS — T48292A Poisoning by other drugs acting on muscles, intentional self-harm, initial encounter: Secondary | ICD-10-CM | POA: Diagnosis present

## 2016-11-24 DIAGNOSIS — Z882 Allergy status to sulfonamides status: Secondary | ICD-10-CM

## 2016-11-24 DIAGNOSIS — Z915 Personal history of self-harm: Secondary | ICD-10-CM

## 2016-11-24 DIAGNOSIS — I959 Hypotension, unspecified: Secondary | ICD-10-CM | POA: Diagnosis present

## 2016-11-24 DIAGNOSIS — Z6841 Body Mass Index (BMI) 40.0 and over, adult: Secondary | ICD-10-CM

## 2016-11-24 LAB — URINALYSIS, ROUTINE W REFLEX MICROSCOPIC
Bilirubin Urine: NEGATIVE
Glucose, UA: NEGATIVE mg/dL
HGB URINE DIPSTICK: NEGATIVE
Ketones, ur: NEGATIVE mg/dL
Leukocytes, UA: NEGATIVE
Nitrite: NEGATIVE
PH: 5 (ref 5.0–8.0)
Protein, ur: NEGATIVE mg/dL
SPECIFIC GRAVITY, URINE: 1.014 (ref 1.005–1.030)

## 2016-11-24 LAB — COMPREHENSIVE METABOLIC PANEL
ALT: 13 U/L — ABNORMAL LOW (ref 14–54)
ANION GAP: 9 (ref 5–15)
AST: 26 U/L (ref 15–41)
Albumin: 3.9 g/dL (ref 3.5–5.0)
Alkaline Phosphatase: 54 U/L (ref 38–126)
BILIRUBIN TOTAL: 1.2 mg/dL (ref 0.3–1.2)
BUN: 17 mg/dL (ref 6–20)
CALCIUM: 9 mg/dL (ref 8.9–10.3)
CHLORIDE: 105 mmol/L (ref 101–111)
CO2: 22 mmol/L (ref 22–32)
Creatinine, Ser: 1.35 mg/dL — ABNORMAL HIGH (ref 0.44–1.00)
GFR, EST AFRICAN AMERICAN: 57 mL/min — AB (ref >60)
GFR, EST NON AFRICAN AMERICAN: 49 mL/min — AB (ref >60)
Glucose, Bld: 157 mg/dL — ABNORMAL HIGH (ref 65–99)
Potassium: 4.9 mmol/L (ref 3.5–5.1)
Sodium: 136 mmol/L (ref 135–145)
Total Protein: 6.9 g/dL (ref 6.5–8.1)

## 2016-11-24 LAB — ETHANOL

## 2016-11-24 LAB — CBC
HCT: 37.6 % (ref 36.0–46.0)
Hemoglobin: 12.5 g/dL (ref 12.0–15.0)
MCH: 28.6 pg (ref 26.0–34.0)
MCHC: 33.2 g/dL (ref 30.0–36.0)
MCV: 86 fL (ref 78.0–100.0)
PLATELETS: 226 10*3/uL (ref 150–400)
RBC: 4.37 MIL/uL (ref 3.87–5.11)
RDW: 13.3 % (ref 11.5–15.5)
WBC: 14.8 10*3/uL — ABNORMAL HIGH (ref 4.0–10.5)

## 2016-11-24 LAB — CBG MONITORING, ED: Glucose-Capillary: 162 mg/dL — ABNORMAL HIGH (ref 65–99)

## 2016-11-24 LAB — RAPID URINE DRUG SCREEN, HOSP PERFORMED
Amphetamines: NOT DETECTED
Barbiturates: NOT DETECTED
Benzodiazepines: POSITIVE — AB
Cocaine: NOT DETECTED
OPIATES: NOT DETECTED
Tetrahydrocannabinol: NOT DETECTED

## 2016-11-24 LAB — SALICYLATE LEVEL

## 2016-11-24 LAB — POC URINE PREG, ED: PREG TEST UR: NEGATIVE

## 2016-11-24 LAB — ACETAMINOPHEN LEVEL

## 2016-11-24 MED ORDER — SODIUM CHLORIDE 0.9 % IV BOLUS (SEPSIS)
1000.0000 mL | Freq: Once | INTRAVENOUS | Status: AC
  Administered 2016-11-24: 1000 mL via INTRAVENOUS

## 2016-11-24 MED ORDER — SODIUM CHLORIDE 0.9 % IV BOLUS (SEPSIS)
500.0000 mL | Freq: Once | INTRAVENOUS | Status: AC
  Administered 2016-11-25: 500 mL via INTRAVENOUS

## 2016-11-24 MED ORDER — SODIUM CHLORIDE 0.9 % IV SOLN
INTRAVENOUS | Status: DC
  Administered 2016-11-25 – 2016-11-26 (×4): via INTRAVENOUS

## 2016-11-24 NOTE — ED Notes (Signed)
Bed: RESB Expected date:  Expected time:  Means of arrival:  Comments: OD 

## 2016-11-24 NOTE — ED Notes (Signed)
[facility] my experience CNS depression, respiratory depression, bradycardia, and hypotension from zanaflex; patient my experience drowsiness, become flaccid, bradycardia, and hypotensive from keppra. Cardiac monitor and hydrate patient. If patient becomes cool, clammy, and does not respond use atropine. If patient becomes hypotensive and does not respond to fluids give NE or dopamine. Draw all labs as ordered and repeats tylenol level at 2030. Observe for a minimum of 6 hours. Provider made aware of all the above.

## 2016-11-24 NOTE — ED Notes (Signed)
Pt aware of need for urine specimen. 

## 2016-11-24 NOTE — ED Notes (Signed)
Pt has shirt pants flip flops and bra in bag placed in cabinets at nursing station labeled [id number].

## 2016-11-24 NOTE — ED Provider Notes (Signed)
[facility]-EMERGENCY DEPT Provider Note   CSN: [id number] Arrival date & time: 11/24/16  1835     History   Chief Complaint Chief Complaint  Patient presents with  . Drug Overdose    HPI Victoria Middleton is a 36 y.o. female.  HPI This is a 36 year old female who presents today via [facility] with report of overdose. She states that she has been depressed and wished to harm herself. She reports taking 25-30 Keppra 500 mg and an unknown amount of Zanaflex. She is somewhat sleepy. She does not have any other complaints. Past Medical History:  Diagnosis Date  . Depression   . Headache(784.0)   . Kidney stones   . Migraine   . Seizures [facility])     Patient Active Problem List   Diagnosis Date Noted  . Major depressive disorder, recurrent episode, moderate (HCC) 09/13/2012    Past Surgical History:  Procedure Laterality Date  . ANKLE SURGERY     left  . APPENDECTOMY    . CHOLECYSTECTOMY    . KNEE SURGERY    . TONSILLECTOMY      OB History    Gravida Para Term Preterm AB Living   1             SAB TAB Ectopic Multiple Live Births                   Home Medications    Prior to Admission medications   Medication Sig Start Date End Date Taking? Authorizing Provider  ibuprofen (ADVIL,MOTRIN) 800 MG tablet Take 1 tablet (800 mg total) by mouth every 6 (six) hours as needed for moderate pain. Patient not taking: Reported on 10/23/2016 04/14/16   Lyndal Pulley, MD  levETIRAcetam (KEPPRA) 500 MG tablet Take 500 mg by mouth 2 (two) times daily. 08/07/16   [provider]  levonorgestrel (MIRENA) 20 MCG/24HR IUD 1 each by Intrauterine route once.    [provider]  propranolol (INDERAL) 20 MG tablet Take 20 mg by mouth 2 (two) times daily. 08/07/16 02/03/17  [provider]    Family History No family history on file.  Social History Social History  Substance Use Topics  . Smoking status: Never Smoker  . Smokeless tobacco: Never Used  .  Alcohol use No     Allergies   Aripiprazole; Benadryl [diphenhydramine hcl]; Abilify [aripiprazole]; Ketorolac tromethamine; Naprosyn [naproxen]; Penicillins; Penicillins; Sulfa antibiotics; Sulfa antibiotics; and Toradol [ketorolac tromethamine]   Review of Systems Review of Systems  Psychiatric/Behavioral: Positive for dysphoric mood, self-injury and suicidal ideas.  All other systems reviewed and are negative.    Physical Exam Updated Vital Signs BP 109/76 (BP Location: Left Arm)   Pulse (!) 50   Temp 97.8 F (36.6 C) (Oral)   Resp (!) 22   Ht 5\' 8"  (1.727 m)   Wt 131.5 kg   LMP 11/05/2016   SpO2 96%   BMI 44.09 kg/m   Physical Exam  Constitutional: She is oriented to person, place, and time. She appears well-developed and well-nourished.  Morbidly obese  HENT:  Head: Normocephalic and atraumatic.  Right Ear: External ear normal.  Left Ear: External ear normal.  Nose: Nose normal.  Mouth/Throat: Oropharynx is clear and moist.  Eyes: EOM are normal.  Pupils are small but equal and reactive  Neck: Normal range of motion. Neck supple.  Cardiovascular: Normal rate, regular rhythm, normal heart sounds and intact distal pulses.   Pulmonary/Chest: Effort normal and breath sounds normal.  Abdominal: Soft. Bowel sounds are normal.  Musculoskeletal: Normal range of motion.  Neurological: She is alert and oriented to person, place, and time. She displays normal reflexes. No cranial nerve deficit. She exhibits normal muscle tone. Coordination normal.  Skin: Skin is warm. Capillary refill takes less than 2 seconds.  Psychiatric: Her speech is normal and behavior is normal. Cognition and memory are normal. She exhibits a depressed mood. She expresses suicidal ideation. She expresses suicidal plans.  Nursing note and vitals reviewed.    ED Treatments / Results  Labs (all labs ordered are listed, but only abnormal results are displayed) Labs Reviewed  COMPREHENSIVE  METABOLIC PANEL  ETHANOL  SALICYLATE LEVEL  ACETAMINOPHEN LEVEL  CBC  RAPID URINE DRUG SCREEN, HOSP PERFORMED  CBG MONITORING, ED    EKG  EKG Interpretation  Date/Time:  Friday Nov 24 2016 18:44:21 EDT Ventricular Rate:  54 PR Interval:    QRS Duration: 85 QT Interval:  432 QTC Calculation: 410 R Axis:   18 Text Interpretation:  Sinus rhythm Low voltage, precordial leads Borderline ST elevation, lateral leads Confirmed by Cipriano Millikan MD, Duwayne HeckANIELLE ([id number]) on 11/24/2016 6:58:38 PM       Radiology No results found.  Procedures Procedures (including critical care time) IVC papers signed here. Medications Ordered in ED Medications - No data to display   Initial Impression / Assessment and Plan / ED Course  I have reviewed the triage vital signs and the nursing notes.  Pertinent labs & imaging results that were available during my care of the patient were reviewed by me and considered in my medical decision making (see chart for details).     Patient With purposeful overdose of Keppra and Zanaflex today. Patient has had blood pressure that has been systolically but 100 and heart rate about 50. She has remained awake and alert. She needs to continue to be observed for any CNS depression, respiratory depression or worsening bradycardia. Patient's care discussed with Dr. Maryfrances Bunnellanford and he will admit.  Final Clinical Impressions(s) / ED Diagnoses   Final diagnoses:  Intentional drug overdose, initial encounter [facility](HCC)  Suicidal ideation    New Prescriptions New Prescriptions   No medications on file     Margarita Grizzleay, Takesha Steger, MD 11/24/16 2234

## 2016-11-24 NOTE — ED Triage Notes (Signed)
Per EMS pt verbalizes took (25) 500 mg keppra and (15) 4 mg zanaflex at 1630 in attempt to hurt self related to recent stressors planning her wedding. Fiance and pt en route to hospital and stopped a sheriff's office; EMS called and transported here. Pt alerted and oriented x4 but drowsy and falls asleep with conversation.

## 2016-11-24 NOTE — H&P (Addendum)
History and Physical  Patient Name: Victoria Middleton     UJW:[id number]    DOB: [birth year]    DOA: 11/24/2016 PCP: Patient, No Pcp Per   Patient coming from: Home  Chief Complaint: Overdose, intentional harm  HPI: Victoria Middleton is a 36 y.o. female with a past medical history significant for depression with multiple suicide attempts in past, migraines and seizure disorder who presents with Zanaflex and Keppra overdose.  The patient has been under a lot of stress lately, was feeling overwhelmed today, depressed and unable to cope and so she "impulsively" took #25 Keppra IR 500 mg and #15 Zanaflex 4 mg around 4 or 5PM.  She then immediately told her fiance, who brought her to the ER.  She feels tired and somewhat sleepy/sedated, but no syncope, LOC, nausea, confusion, disorientation at any point.  ED course: -Afebrile, heart rate 50, respirations pulse ox normal, blood pressure 109/76 -Na 136, K 4.9, Cr 1.35 (baseline 0.9-1.2), WBC 14.8 K, Hgb 12.5 -Alcohol negative -Acetaminophen and salicylates negative -Repeat acetaminophen at 90 minutes no delta -ECG showed sinus bradycardia -While in ER, had transient bradycardia as low as 40s, but was never symptomatic -Discussed with poison control -IVC was placed on patient in ER -TRH were asked to evaluate for medical clearance prior to Psychiatric evaluation    Per chart, the patient was admitted to [facility] in 2014 for suicide attempt with Klonopin.  Notes at that time state that she had been hospitalized numerous times for suicide attempts in [facility] where she lives in the past.  She is not currently on psychiatric meds.  The patient recently had a herniated disc with some myelopathy for which she underwent ACDF at [facility] in March, healed up perfectly well, no further issues.          ROS: Review of Systems  Constitutional: Positive for malaise/fatigue.  All other systems reviewed and are negative.         Past Medical History:    Diagnosis Date  . Depression   . Headache(784.0)   . Kidney stones   . Migraine   . Seizures (HCC)     Past Surgical History:  Procedure Laterality Date  . ANKLE SURGERY     left  . ANTERIOR CERVICAL DECOMP/DISCECTOMY FUSION  2018  . APPENDECTOMY    . CHOLECYSTECTOMY    . KNEE SURGERY    . TONSILLECTOMY      Social History: Patient lives between her parents' and fiance's house.  She is an unemployed Lawyer.  The patient walks unassisted.  She does not smoke, uses minimal alcohol, no illicit drugs.    Allergies  Allergen Reactions  . Aripiprazole Other (See Comments)    Reaction:  Seizures   . Benadryl [Diphenhydramine Hcl] Other (See Comments)    Reaction:  Jittery and restless   . Ketorolac Tromethamine Rash  . Naprosyn [Naproxen] Rash  . Penicillins Rash and Other (See Comments)    Has patient had a PCN reaction causing immediate rash, facial/tongue/throat swelling, SOB or lightheadedness with hypotension: No Has patient had a PCN reaction causing severe rash involving mucus membranes or skin necrosis: No Has patient had a PCN reaction that required hospitalization No Has patient had a PCN reaction occurring within the last 10 years: No If all of the above answers are "NO", then may proceed with Cephalosporin use.  . Sulfa Antibiotics Rash    Family history: family history includes Depression in her mother; Diabetes in her father  and mother; Heart attack in her father; Hypertension in her father and mother; Obesity in her mother; Suicidality in her mother.  Prior to Admission medications   Medication Sig Start Date End Date Taking? Authorizing Provider  levETIRAcetam (KEPPRA) 500 MG tablet Take 500 mg by mouth 2 (two) times daily.   Yes [provider]  levonorgestrel (MIRENA) 20 MCG/24HR IUD 1 each by Intrauterine route once.   Yes [provider]  propranolol (INDERAL) 20 MG tablet Take 20 mg by mouth 2 (two) times daily.   Yes [provider]   tiZANidine (ZANAFLEX) 4 MG tablet Take 4 mg by mouth every 6 (six) hours as needed for muscle spasms.   Yes [provider]       Physical Exam: BP 103/69   Pulse (!) 50   Temp 97.8 F (36.6 C) (Oral)   Resp 20   Ht 5\' 8"  (1.727 m)   Wt 131.5 kg (290 lb)   LMP 11/05/2016   SpO2 96%   BMI 44.09 kg/m  General appearance: Well-developed, obese adult female, alert and in no acute distress.   Eyes: Anicteric, conjunctiva pink, lids and lashes normal. PERRL.    ENT: No nasal deformity, discharge, epistaxis.  Hearing normal. OP moist without lesions.   Neck: No neck masses.  Trachea midline.  Well-healed ACDF scar. Skin: Warm and dry.  No jaundice.  No suspicious rashes or lesions on face, neck, upper chest, back, arms or legs. Cardiac: Slow regular, nl S1-S2, no murmurs appreciated.  Capillary refill is brisk.  JVP not visible.  No LE edema.  Radial and DP pulses 2+ and symmetric. Respiratory: Normal respiratory rate and rhythm.  CTAB without rales or wheezes. Abdomen: Abdomen soft.  No TTP. No ascites, distension, hepatosplenomegaly palpable given habitus.   MSK: No deformities or effusions.  No cyanosis or clubbing. Neuro: Cranial nerves normal.  Sensation intact to light touch. Speech is fluent.  Muscle strength 5/5 and symmetric.    Psych: Sensorium intact and responding to questions, attention normal.  Behavior appropriate.  Affect normal.  Judgment and insight appear normal.  Endorses that ingestion was intentional for self-harm, feels overwhelmed.     Labs on Admission:  I have personally reviewed following labs and imaging studies: CBC:  Recent Labs Lab 11/24/16 1906  WBC 14.8*  HGB 12.5  HCT 37.6  MCV 86.0  PLT 226   Basic Metabolic Panel:  Recent Labs Lab 11/24/16 1906  NA 136  K 4.9  CL 105  CO2 22  GLUCOSE 157*  BUN 17  CREATININE 1.35*  CALCIUM 9.0   GFR: Estimated Creatinine Clearance: 81.9 mL/min (A) (by C-G formula based on SCr of 1.35  mg/dL (H)).  Liver Function Tests:  Recent Labs Lab 11/24/16 1906  AST 26  ALT 13*  ALKPHOS 54  BILITOT 1.2  PROT 6.9  ALBUMIN 3.9   No results for input(s): LIPASE, AMYLASE in the last 168 hours. No results for input(s): AMMONIA in the last 168 hours. Coagulation Profile: No results for input(s): INR, PROTIME in the last 168 hours. Cardiac Enzymes: No results for input(s): CKTOTAL, CKMB, CKMBINDEX, TROPONINI in the last 168 hours. BNP (last 3 results) No results for input(s): PROBNP in the last 8760 hours. HbA1C: No results for input(s): HGBA1C in the last 72 hours. CBG:  Recent Labs Lab 11/24/16 1854  GLUCAP 162*   Lipid Profile: No results for input(s): CHOL, HDL, LDLCALC, TRIG, CHOLHDL, LDLDIRECT in the last 72 hours. Thyroid  Function Tests: No results for input(s): TSH, T4TOTAL, FREET4, T3FREE, THYROIDAB in the last 72 hours. Anemia Panel: No results for input(s): VITAMINB12, FOLATE, FERRITIN, TIBC, IRON, RETICCTPCT in the last 72 hours. Sepsis Labs:  Invalid input(s): PROCALCITONIN, LACTICIDVEN No results found for this or any previous visit (from the past 240 hour(s)).        EKG: Independently reviewed. Rate 54, QTc normal, no significnat ST changes.       Assessment/Plan  1. Intentional Zanaflex and Keppra overdose:  Tizanidine has some central alpha agonist effects, explaining the patient's bradycardia and hypotension.  Peak effect ~4 hours (I.e. Two hours ago) and half-life ~3 hrs so brief observation reasonable in asymptomatic patients such as this one.  Keppra likewise conceivably causes CNS depression and hypotension, per Poison control   -Monitor in stepdown given current BP -Supportive care with fluids -Naloxone and/or atropine for symptomatic bradycardia or hypotension not responsive to fluids   2. Suicidality:  -Continue IVC, 1:1 -Consult to Psychiatry  3. Migraines:  -Hold propranolol given hypotension  4. Seizure disorder:  -Hold  Keppra given overdose  5. Elevated creatinine: Baseline as low as 0.9 in the past, but also as high as 1.2.  Probably this is some contraction, but will hydrate and trend.  UA bland. -Check FeNA -Fluids and trend Scr          DVT prophylaxis: Lovenox  Code Status: FULL  Family Communication: Fiancee at bedside  Disposition Plan: Anticipate IV fluids, close hemodynamic monitoring Psych consult. Consults called: Psych overnight Admission status: OBS At the point of initial evaluation, it is my clinical opinion that admission for OBSERVATION is reasonable and necessary because the patient's presenting complaints in the context of their chronic conditions represent sufficient risk of deterioration or significant morbidity to constitute reasonable grounds for close observation in the hospital setting, but that the patient may be medically stable for discharge from the hospital within 24 to 48 hours.    Medical decision making: Patient seen at 10:15 PM on 11/24/2016.  The patient was discussed with Dr. Rosalia Hammersay.  What exists of the patient's chart was reviewed in depth and summarized above.  Clinical condition: currently asymptomatic, fluid responsive, mentating well, comfortable.        Alberteen SamChristopher P Danford [facility] Pager [phone]

## 2016-11-24 NOTE — ED Notes (Signed)
Pt has been wanded by security. 

## 2016-11-25 DIAGNOSIS — Z818 Family history of other mental and behavioral disorders: Secondary | ICD-10-CM

## 2016-11-25 DIAGNOSIS — T426X2A Poisoning by other antiepileptic and sedative-hypnotic drugs, intentional self-harm, initial encounter: Secondary | ICD-10-CM | POA: Diagnosis not present

## 2016-11-25 DIAGNOSIS — T50902A Poisoning by unspecified drugs, medicaments and biological substances, intentional self-harm, initial encounter: Secondary | ICD-10-CM | POA: Diagnosis not present

## 2016-11-25 DIAGNOSIS — T1491XA Suicide attempt, initial encounter: Secondary | ICD-10-CM

## 2016-11-25 DIAGNOSIS — R7989 Other specified abnormal findings of blood chemistry: Secondary | ICD-10-CM | POA: Diagnosis not present

## 2016-11-25 DIAGNOSIS — T428X2A Poisoning by antiparkinsonism drugs and other central muscle-tone depressants, intentional self-harm, initial encounter: Secondary | ICD-10-CM

## 2016-11-25 DIAGNOSIS — F331 Major depressive disorder, recurrent, moderate: Secondary | ICD-10-CM | POA: Diagnosis not present

## 2016-11-25 LAB — BASIC METABOLIC PANEL
ANION GAP: 7 (ref 5–15)
BUN: 15 mg/dL (ref 6–20)
CO2: 22 mmol/L (ref 22–32)
Calcium: 8.3 mg/dL — ABNORMAL LOW (ref 8.9–10.3)
Chloride: 110 mmol/L (ref 101–111)
Creatinine, Ser: 1.01 mg/dL — ABNORMAL HIGH (ref 0.44–1.00)
GFR calc Af Amer: >60 mL/min (ref >60)
GLUCOSE: 105 mg/dL — AB (ref 65–99)
POTASSIUM: 3.8 mmol/L (ref 3.5–5.1)
Sodium: 139 mmol/L (ref 135–145)

## 2016-11-25 LAB — CBC
HEMATOCRIT: 32.8 % — AB (ref 36.0–46.0)
Hemoglobin: 10.8 g/dL — ABNORMAL LOW (ref 12.0–15.0)
MCH: 28.6 pg (ref 26.0–34.0)
MCHC: 32.9 g/dL (ref 30.0–36.0)
MCV: 87 fL (ref 78.0–100.0)
Platelets: 172 10*3/uL (ref 150–400)
RBC: 3.77 MIL/uL — AB (ref 3.87–5.11)
RDW: 13.4 % (ref 11.5–15.5)
WBC: 8.3 10*3/uL (ref 4.0–10.5)

## 2016-11-25 LAB — MRSA PCR SCREENING: MRSA by PCR: NEGATIVE

## 2016-11-25 MED ORDER — ACETAMINOPHEN 325 MG PO TABS
650.0000 mg | ORAL_TABLET | Freq: Four times a day (QID) | ORAL | Status: DC | PRN
  Administered 2016-11-25 – 2016-11-26 (×2): 650 mg via ORAL
  Filled 2016-11-25 (×2): qty 2

## 2016-11-25 MED ORDER — ONDANSETRON HCL 4 MG/2ML IJ SOLN
4.0000 mg | Freq: Three times a day (TID) | INTRAMUSCULAR | Status: DC | PRN
  Administered 2016-11-25 – 2016-11-26 (×2): 4 mg via INTRAVENOUS
  Filled 2016-11-25 (×2): qty 2

## 2016-11-25 MED ORDER — SODIUM CHLORIDE 0.9% FLUSH
3.0000 mL | Freq: Two times a day (BID) | INTRAVENOUS | Status: DC
  Administered 2016-11-26 – 2016-11-27 (×3): 3 mL via INTRAVENOUS

## 2016-11-25 MED ORDER — ENOXAPARIN SODIUM 60 MG/0.6ML ~~LOC~~ SOLN
60.0000 mg | Freq: Every day | SUBCUTANEOUS | Status: DC
  Administered 2016-11-25 – 2016-11-27 (×4): 60 mg via SUBCUTANEOUS
  Filled 2016-11-25 (×5): qty 0.6

## 2016-11-25 NOTE — Consult Note (Addendum)
[facility] Consult   Reason for Consult:  Suicide attempt Referring Physician:  Admitting physician Patient Identification: Victoria Middleton MRN:  [id number] Principal Diagnosis: Overdose Diagnosis:   Patient Active Problem List   Diagnosis Date Noted  . Overdose [T50.901A] 11/24/2016  . Elevated serum creatinine [R79.89] 11/24/2016  . Seizure disorder ([place]) [I50.277] 08/24/2016  . Intractable chronic migraine without aura and with status migrainosus [G43.711] 01/24/2016  . Major depressive disorder, recurrent episode, moderate (HCC) [F33.1] 09/13/2012    Total Time spent with patient: 1 hour  Subjective:   Victoria Middleton is a 36 y.o. female patient admitted with depression and suicide attempt.Marland Kitchen  HPI:  Victoria Middleton is a 36 y.o. female with a past medical history significant for depression with multiple suicide attempts in past, migraines and seizure disorder who presents with Zanaflex and Keppra overdose.  The patient has been under a lot of stress lately, was feeling overwhelmed today, depressed and unable to cope and so she "impulsively" took #25 Keppra IR 500 mg and #15 Zanaflex 4 mg around 4 or 5PM.  She then immediately told her fiance, who brought her to the ER.   On current evaluation she endorsed feeling depressed and it was building up for last few months. Had argument with parents prior admission which led to impulsive event and she overdosed . She understand she could have died  Says have been feeling anxious, overwhelm and a failure prior admission.   She has multiple admissions and attempt in past.  Has 2 kids she take care of Lives with parents which has been more stressful  She is otherwise alert, oriented today. Mood remains depressed, affect is reactive No psychosis.  Denies using substances of abuse   Past Psychiatric History: Major depression, recurrent   Risk to Self: Is patient at risk for suicide?: Yes Risk to Others:   Prior  Inpatient Therapy:   Prior Outpatient Therapy:    Past Medical History:  Past Medical History:  Diagnosis Date  . Depression   . Headache(784.0)   . Kidney stones   . Migraine   . Seizures ([place])     Past Surgical History:  Procedure Laterality Date  . ANKLE SURGERY     left  . ANTERIOR CERVICAL DECOMP/DISCECTOMY FUSION  2018  . APPENDECTOMY    . CHOLECYSTECTOMY    . KNEE SURGERY    . TONSILLECTOMY     Family History:  Family History  Problem Relation Age of Onset  . Obesity Mother   . Diabetes Mother   . Hypertension Mother   . Depression Mother   . Suicidality Mother   . Diabetes Father   . Hypertension Father   . Heart attack Father    Family Psychiatric  History: see chart Social History:  History  Alcohol Use  . Yes    Comment: Rare     History  Drug Use No    Social History   Social History  . Marital status: Single    Spouse name: N/A  . Number of children: N/A  . Years of education: N/A   Social History Main Topics  . Smoking status: Never Smoker  . Smokeless tobacco: Never Used  . Alcohol use Yes     Comment: Rare  . Drug use: No  . Sexual activity: No     Comment: not assessed   Other Topics Concern  . None   Social History Narrative   ** Merged History Encounter **  Additional Social History:    Allergies:   Allergies  Allergen Reactions  . Aripiprazole Other (See Comments)    Reaction:  Seizures   . Benadryl [Diphenhydramine Hcl] Other (See Comments)    Reaction:  Jittery and restless   . Ketorolac Tromethamine Rash  . Naprosyn [Naproxen] Rash  . Penicillins Rash and Other (See Comments)    Has patient had a PCN reaction causing immediate rash, facial/tongue/throat swelling, SOB or lightheadedness with hypotension: No Has patient had a PCN reaction causing severe rash involving mucus membranes or skin necrosis: No Has patient had a PCN reaction that required hospitalization No Has patient had a PCN reaction occurring  within the last 10 years: No If all of the above answers are "NO", then may proceed with Cephalosporin use.  . Sulfa Antibiotics Rash    Labs:  Results for orders placed or performed during the hospital encounter of 11/24/16 (from the past 48 hour(s))  CBG monitoring, ED     Status: Abnormal   Collection Time: 11/24/16  6:54 PM  Result Value Ref Range   Glucose-Capillary 162 (H) 65 - 99 mg/dL   Comment 1 Notify RN    Comment 2 Document in Chart   Comprehensive metabolic panel     Status: Abnormal   Collection Time: 11/24/16  7:06 PM  Result Value Ref Range   Sodium 136 135 - 145 mmol/L   Potassium 4.9 3.5 - 5.1 mmol/L   Chloride 105 101 - 111 mmol/L   CO2 22 22 - 32 mmol/L   Glucose, Bld 157 (H) 65 - 99 mg/dL   BUN 17 6 - 20 mg/dL   Creatinine, Ser 1.35 (H) 0.44 - 1.00 mg/dL   Calcium 9.0 8.9 - 10.3 mg/dL   Total Protein 6.9 6.5 - 8.1 g/dL   Albumin 3.9 3.5 - 5.0 g/dL   AST 26 15 - 41 U/L   ALT 13 (L) 14 - 54 U/L   Alkaline Phosphatase 54 38 - 126 U/L   Total Bilirubin 1.2 0.3 - 1.2 mg/dL   GFR calc non Af Amer 49 (L) >60 mL/min   GFR calc Af Amer 57 (L) >60 mL/min    Comment: (NOTE) The eGFR has been calculated using the CKD EPI equation. This calculation has not been validated in all clinical situations. eGFR's persistently <60 mL/min signify possible Chronic Kidney Disease.    Anion gap 9 5 - 15  Ethanol     Status: None   Collection Time: 11/24/16  7:06 PM  Result Value Ref Range   Alcohol, Ethyl (B) <5 <5 mg/dL    Comment:        LOWEST DETECTABLE LIMIT FOR SERUM ALCOHOL IS 5 mg/dL FOR MEDICAL PURPOSES ONLY   Salicylate level     Status: None   Collection Time: 11/24/16  7:06 PM  Result Value Ref Range   Salicylate Lvl <1.3 2.8 - 30.0 mg/dL  Acetaminophen level     Status: Abnormal   Collection Time: 11/24/16  7:06 PM  Result Value Ref Range   Acetaminophen (Tylenol), Serum <10 (L) 10 - 30 ug/mL    Comment:        THERAPEUTIC CONCENTRATIONS  VARY SIGNIFICANTLY. A RANGE OF 10-30 ug/mL MAY BE AN EFFECTIVE CONCENTRATION FOR MANY PATIENTS. HOWEVER, SOME ARE BEST TREATED AT CONCENTRATIONS OUTSIDE THIS RANGE. ACETAMINOPHEN CONCENTRATIONS >150 ug/mL AT 4 HOURS AFTER INGESTION AND >50 ug/mL AT 12 HOURS AFTER INGESTION ARE OFTEN ASSOCIATED WITH TOXIC REACTIONS.   cbc  Status: Abnormal   Collection Time: 11/24/16  7:06 PM  Result Value Ref Range   WBC 14.8 (H) 4.0 - 10.5 K/uL   RBC 4.37 3.87 - 5.11 MIL/uL   Hemoglobin 12.5 12.0 - 15.0 g/dL   HCT 37.6 36.0 - 46.0 %   MCV 86.0 78.0 - 100.0 fL   MCH 28.6 26.0 - 34.0 pg   MCHC 33.2 30.0 - 36.0 g/dL   RDW 13.3 11.5 - 15.5 %   Platelets 226 150 - 400 K/uL  Rapid urine drug screen (hospital performed)     Status: Abnormal   Collection Time: 11/24/16  8:38 PM  Result Value Ref Range   Opiates NONE DETECTED NONE DETECTED   Cocaine NONE DETECTED NONE DETECTED   Benzodiazepines POSITIVE (A) NONE DETECTED   Amphetamines NONE DETECTED NONE DETECTED   Tetrahydrocannabinol NONE DETECTED NONE DETECTED   Barbiturates NONE DETECTED NONE DETECTED    Comment:        DRUG SCREEN FOR MEDICAL PURPOSES ONLY.  IF CONFIRMATION IS NEEDED FOR ANY PURPOSE, NOTIFY LAB WITHIN 5 DAYS.        LOWEST DETECTABLE LIMITS FOR URINE DRUG SCREEN Drug Class       Cutoff (ng/mL) Amphetamine      1000 Barbiturate      200 Benzodiazepine   094 Tricyclics       709 Opiates          300 Cocaine          300 THC              50   Urinalysis, Routine w reflex microscopic     Status: None   Collection Time: 11/24/16  8:38 PM  Result Value Ref Range   Color, Urine YELLOW YELLOW   APPearance CLEAR CLEAR   Specific Gravity, Urine 1.014 1.005 - 1.030   pH 5.0 5.0 - 8.0   Glucose, UA NEGATIVE NEGATIVE mg/dL   Hgb urine dipstick NEGATIVE NEGATIVE   Bilirubin Urine NEGATIVE NEGATIVE   Ketones, ur NEGATIVE NEGATIVE mg/dL   Protein, ur NEGATIVE NEGATIVE mg/dL   Nitrite NEGATIVE NEGATIVE   Leukocytes, UA  NEGATIVE NEGATIVE  Acetaminophen level     Status: Abnormal   Collection Time: 11/24/16  8:40 PM  Result Value Ref Range   Acetaminophen (Tylenol), Serum <10 (L) 10 - 30 ug/mL    Comment:        THERAPEUTIC CONCENTRATIONS VARY SIGNIFICANTLY. A RANGE OF 10-30 ug/mL MAY BE AN EFFECTIVE CONCENTRATION FOR MANY PATIENTS. HOWEVER, SOME ARE BEST TREATED AT CONCENTRATIONS OUTSIDE THIS RANGE. ACETAMINOPHEN CONCENTRATIONS >150 ug/mL AT 4 HOURS AFTER INGESTION AND >50 ug/mL AT 12 HOURS AFTER INGESTION ARE OFTEN ASSOCIATED WITH TOXIC REACTIONS.   POC Urine Pregnancy, ED  (not at [facility])     Status: None   Collection Time: 11/24/16  8:44 PM  Result Value Ref Range   Preg Test, Ur NEGATIVE NEGATIVE    Comment:        THE SENSITIVITY OF THIS METHODOLOGY IS >24 mIU/mL   MRSA PCR Screening     Status: None   Collection Time: 11/25/16  2:16 AM  Result Value Ref Range   MRSA by PCR NEGATIVE NEGATIVE    Comment:        The GeneXpert MRSA Assay (FDA approved for NASAL specimens only), is one component of a comprehensive MRSA colonization surveillance program. It is not intended to diagnose MRSA infection nor to guide or monitor treatment for MRSA infections.  Basic metabolic panel     Status: Abnormal   Collection Time: 11/25/16  4:21 AM  Result Value Ref Range   Sodium 139 135 - 145 mmol/L   Potassium 3.8 3.5 - 5.1 mmol/L    Comment: DELTA CHECK NOTED REPEATED TO VERIFY    Chloride 110 101 - 111 mmol/L   CO2 22 22 - 32 mmol/L   Glucose, Bld 105 (H) 65 - 99 mg/dL   BUN 15 6 - 20 mg/dL   Creatinine, Ser 1.01 (H) 0.44 - 1.00 mg/dL   Calcium 8.3 (L) 8.9 - 10.3 mg/dL   GFR calc non Af Amer >60 >60 mL/min   GFR calc Af Amer >60 >60 mL/min    Comment: (NOTE) The eGFR has been calculated using the CKD EPI equation. This calculation has not been validated in all clinical situations. eGFR's persistently <60 mL/min signify possible Chronic Kidney Disease.    Anion gap 7 5 - 15  CBC      Status: Abnormal   Collection Time: 11/25/16  4:21 AM  Result Value Ref Range   WBC 8.3 4.0 - 10.5 K/uL   RBC 3.77 (L) 3.87 - 5.11 MIL/uL   Hemoglobin 10.8 (L) 12.0 - 15.0 g/dL   HCT 32.8 (L) 36.0 - 46.0 %   MCV 87.0 78.0 - 100.0 fL   MCH 28.6 26.0 - 34.0 pg   MCHC 32.9 30.0 - 36.0 g/dL   RDW 13.4 11.5 - 15.5 %   Platelets 172 150 - 400 K/uL    Current Facility-Administered Medications  Medication Dose Route Frequency Provider Last Rate Last Dose  . 0.9 %  sodium chloride infusion   Intravenous Continuous Edwin Dada, MD 125 mL/hr at 11/25/16 1001    . enoxaparin (LOVENOX) injection 60 mg  60 mg Subcutaneous QHS Danford, Suann Larry, MD   60 mg at 11/25/16 0200  . sodium chloride flush (NS) 0.9 % injection 3 mL  3 mL Intravenous Q12H Danford, Suann Larry, MD        Musculoskeletal: Strength & Muscle Tone: within normal limits Gait & Station: in bed Patient leans: no lean  Psychiatric Specialty Exam: Physical Exam  Constitutional: She appears well-developed.  HENT:  Head: Normocephalic.  Skin: She is not diaphoretic.    Review of Systems  Cardiovascular: Negative for chest pain.  Skin: Negative for rash.  Psychiatric/Behavioral: Positive for depression. The patient is nervous/anxious.     Blood pressure 103/65, pulse (!) 55, temperature 98.4 F (36.9 C), resp. rate 18, height '5\' 8"'$  (1.727 m), weight (!) 137.6 kg (303 lb 5.7 oz), last menstrual period 11/05/2016, SpO2 100 %.Body mass index is 46.12 kg/m.  General Appearance: Casual  Eye Contact:  Fair  Speech:  Normal Rate  Volume:  Normal  Mood:  Dysphoric  Affect:  Depressed  Thought Process:  Goal Directed  Orientation:  Full (Time, Place, and Person)  Thought Content:  Rumination  Suicidal Thoughts:  No  Homicidal Thoughts:  No  Memory:  Immediate;   Fair Recent;   Fair  Judgement:  Poor  Insight:  Shallow  Psychomotor Activity:  Normal  Concentration:  Concentration: Fair and Attention  Span: Fair  Recall:  [employer] of Knowledge:  Fair  Language:  Fair  Akathisia:  Negative  Handed:  Right  AIMS (if indicated):     Assets:  Others:  some support system.   ADL's:  Intact  Cognition:  WNL  Sleep:  Treatment Plan Summary: Major depression severe: poor social support system Suicide attempt Keep under IVC and observation:  Can start zoloft '50mg'$  today or hold till medically cleared for depression.    Disposition: Recommend psychiatric Inpatient admission when medically cleared. Supportive therapy provided about ongoing stressors.  Please call [phone] when medically cleared and to have bed available/search for psychiatry admission  Merian Capron, MD 11/25/2016 12:39 PM

## 2016-11-25 NOTE — Progress Notes (Addendum)
PROGRESS NOTE    Victoria Middleton  ZOX:[id number] DOB: [birth year] DOA: 11/24/2016 PCP: Patient, No Pcp Per   Brief Narrative:  Victoria Middleton is a 36 y.o. female with a past medical history significant for depression with multiple suicide attempts in past, migraines and seizure disorder who presents with Zanaflex and Keppra overdose.  The patient has been under a lot of stress lately, was feeling overwhelmed today, depressed and unable to cope and so she "impulsively" took #25 Keppra IR 500 mg and #15 Zanaflex 4 mg around 4 or 5PM.  She then immediately told her fiance, who brought her to the ER.  She feels tired and somewhat sleepy/sedated, but no syncope, LOC, nausea, confusion, disorientation at any point.    Assessment & Plan:   Principal Problem:   Overdose Active Problems:   Major depressive disorder, recurrent episode, moderate (HCC)   Elevated serum creatinine   Seizure disorder (HCC)  1-Intentional Overdose, Zanaflex, and keppra;  Monitor for hypotension and bradycardia.  Could use Naloxone and or atropine for symptomatic bradycardia.  Continue with IV fluids.   Suicidal:  Patient was IVC.  Psych consulted.   Migraines; hold propanolol give hypotension and bradycardia.   Seizure;  Keppra on hold due to overdose.  Will discussed with pharmacy regarding when to resume keppra.  Will start keppra tomorrow.   AKI;  Baseline as low as 0.9 in the past, but also as high as 1.2. Improving with IV fluids.    DVT prophylaxis: Lovenox Code Status: Full code.  Family Communication: care discussed with patient  Disposition Plan: remain in hospital for observation.    Consultants:   Psych   Procedures:  none   Antimicrobials:   none   Subjective: Patient is alert, no distress. She took 2 tablets of her medications. She has been on a lot of stress.  Denies weakness, headaches.    Objective: Vitals:   11/24/16 2230 11/24/16 2300 11/25/16 0200 11/25/16  0300  BP: 91/62 (!) 101/58 (!) 120/99 (!) 101/47  Pulse: (!) 50 (!) 50 (!) 55   Resp: 12 (!) 21 13 17   Temp:   97.8 F (36.6 C)   TempSrc:   Oral   SpO2: 97% 97% 98% 97%  Weight:   (!) 137.6 kg (303 lb 5.7 oz)   Height:   5\' 8"  (1.727 m)     Intake/Output Summary (Last 24 hours) at 11/25/16 0717 Last data filed at 11/25/16 0700  Gross per 24 hour  Intake              730 ml  Output              350 ml  Net              380 ml   Filed Weights   11/24/16 1847 11/25/16 0200  Weight: 131.5 kg (290 lb) (!) 137.6 kg (303 lb 5.7 oz)    Examination:  General exam: Appears calm and comfortable  Respiratory system: Clear to auscultation. Respiratory effort normal. Cardiovascular system: S1 & S2 heard, RRR. No JVD, murmurs, rubs, gallops or clicks. No pedal edema. Gastrointestinal system: Abdomen is nondistended, soft and nontender. No organomegaly or masses felt. Normal bowel sounds heard. Central nervous system: Alert and oriented. No focal neurological deficits. Extremities: Symmetric 5 x 5 power. Skin: No rashes, lesions or ulcers Psychiatry: Judgement and insight appear normal. Mood & affect appropriate.     Data Reviewed: I have personally reviewed following labs  and imaging studies  CBC:  Recent Labs Lab 11/24/16 1906 11/25/16 0421  WBC 14.8* 8.3  HGB 12.5 10.8*  HCT 37.6 32.8*  MCV 86.0 87.0  PLT 226 172   Basic Metabolic Panel:  Recent Labs Lab 11/24/16 1906 11/25/16 0421  NA 136 139  K 4.9 3.8  CL 105 110  CO2 22 22  GLUCOSE 157* 105*  BUN 17 15  CREATININE 1.35* 1.01*  CALCIUM 9.0 8.3*   GFR: Estimated Creatinine Clearance: 112.4 mL/min (A) (by C-G formula based on SCr of 1.01 mg/dL (H)). Liver Function Tests:  Recent Labs Lab 11/24/16 1906  AST 26  ALT 13*  ALKPHOS 54  BILITOT 1.2  PROT 6.9  ALBUMIN 3.9   No results for input(s): LIPASE, AMYLASE in the last 168 hours. No results for input(s): AMMONIA in the last 168 hours. Coagulation  Profile: No results for input(s): INR, PROTIME in the last 168 hours. Cardiac Enzymes: No results for input(s): CKTOTAL, CKMB, CKMBINDEX, TROPONINI in the last 168 hours. BNP (last 3 results) No results for input(s): PROBNP in the last 8760 hours. HbA1C: No results for input(s): HGBA1C in the last 72 hours. CBG:  Recent Labs Lab 11/24/16 1854  GLUCAP 162*   Lipid Profile: No results for input(s): CHOL, HDL, LDLCALC, TRIG, CHOLHDL, LDLDIRECT in the last 72 hours. Thyroid Function Tests: No results for input(s): TSH, T4TOTAL, FREET4, T3FREE, THYROIDAB in the last 72 hours. Anemia Panel: No results for input(s): VITAMINB12, FOLATE, FERRITIN, TIBC, IRON, RETICCTPCT in the last 72 hours. Sepsis Labs: No results for input(s): PROCALCITON, LATICACIDVEN in the last 168 hours.  Recent Results (from the past 240 hour(s))  MRSA PCR Screening     Status: None   Collection Time: 11/25/16  2:16 AM  Result Value Ref Range Status   MRSA by PCR NEGATIVE NEGATIVE Final    Comment:        The GeneXpert MRSA Assay (FDA approved for NASAL specimens only), is one component of a comprehensive MRSA colonization surveillance program. It is not intended to diagnose MRSA infection nor to guide or monitor treatment for MRSA infections.          Radiology Studies: No results found.      Scheduled Meds: . enoxaparin (LOVENOX) injection  60 mg Subcutaneous QHS  . sodium chloride flush  3 mL Intravenous Q12H   Continuous Infusions: . sodium chloride 125 mL/hr at 11/25/16 0200     LOS: 0 days    Time spent: 35 minutes.     Alba Cory, MD [facility] Pager [phone]  If 7PM-7AM, please contact night-coverage [email or web address] Password [facility] 11/25/2016, 7:17 AM

## 2016-11-25 NOTE — Progress Notes (Signed)
One patient belonging bag received from emergency department and placed at nurses station.   Patient clothing and shoes (flip-flops)  were only items noted in bag.  Patient made aware about where belongings are located.

## 2016-11-25 NOTE — Progress Notes (Signed)
Lovenox per Pharmacy for DVT Prophylaxis    Pharmacy has been consulted from dosing enoxaparin (lovenox) in this patient for DVT prophylaxis.  The pharmacist has reviewed pertinent labs (Hgb _12.5__; PLT__226_), patient weight (_131.5__kg) and renal function (CrCl_80__mL/min) and decided that enoxaparin __mg SQ Q24Hrs is appropriate for this patient.  The pharmacy department will sign off at this time.  Please reconsult pharmacy if status changes or for further issues.  Thank you  Luetta NuttingJulian Crowford Chukwuma Straus, Jr PharmD, BCPS  11/25/2016, 1:54 AM

## 2016-11-26 DIAGNOSIS — T50902A Poisoning by unspecified drugs, medicaments and biological substances, intentional self-harm, initial encounter: Secondary | ICD-10-CM | POA: Diagnosis not present

## 2016-11-26 LAB — CBC
HEMATOCRIT: 34.5 % — AB (ref 36.0–46.0)
Hemoglobin: 11.3 g/dL — ABNORMAL LOW (ref 12.0–15.0)
MCH: 28.3 pg (ref 26.0–34.0)
MCHC: 32.8 g/dL (ref 30.0–36.0)
MCV: 86.5 fL (ref 78.0–100.0)
Platelets: 163 10*3/uL (ref 150–400)
RBC: 3.99 MIL/uL (ref 3.87–5.11)
RDW: 12.9 % (ref 11.5–15.5)
WBC: 6.1 10*3/uL (ref 4.0–10.5)

## 2016-11-26 LAB — BASIC METABOLIC PANEL
ANION GAP: 7 (ref 5–15)
BUN: 12 mg/dL (ref 6–20)
CO2: 21 mmol/L — ABNORMAL LOW (ref 22–32)
Calcium: 8.1 mg/dL — ABNORMAL LOW (ref 8.9–10.3)
Chloride: 112 mmol/L — ABNORMAL HIGH (ref 101–111)
Creatinine, Ser: 0.97 mg/dL (ref 0.44–1.00)
GFR calc non Af Amer: >60 mL/min (ref >60)
Glucose, Bld: 101 mg/dL — ABNORMAL HIGH (ref 65–99)
Potassium: 3.5 mmol/L (ref 3.5–5.1)
Sodium: 140 mmol/L (ref 135–145)

## 2016-11-26 MED ORDER — ACETAMINOPHEN 325 MG PO TABS: 650.0000 mg | ORAL_TABLET | Freq: Four times a day (QID) | ORAL | 0 refills | Status: DC | PRN

## 2016-11-26 MED ORDER — LEVETIRACETAM 500 MG PO TABS
500.0000 mg | ORAL_TABLET | Freq: Two times a day (BID) | ORAL | Status: DC
  Administered 2016-11-26 – 2016-11-28 (×4): 500 mg via ORAL
  Filled 2016-11-26 (×5): qty 1

## 2016-11-26 MED ORDER — PROCHLORPERAZINE EDISYLATE 5 MG/ML IJ SOLN
10.0000 mg | Freq: Once | INTRAMUSCULAR | Status: AC
Start: 2016-11-26 — End: 2016-11-26
  Administered 2016-11-26: 10 mg via INTRAVENOUS
  Filled 2016-11-26: qty 2

## 2016-11-26 MED ORDER — TRAMADOL HCL 50 MG PO TABS
25.0000 mg | ORAL_TABLET | Freq: Three times a day (TID) | ORAL | Status: DC | PRN
  Administered 2016-11-26 – 2016-11-27 (×3): 25 mg via ORAL
  Filled 2016-11-26 (×3): qty 1

## 2016-11-26 NOTE — Progress Notes (Signed)
PROGRESS NOTE    [name]  ZOX:[id number] DOB: 09/30/1980 DOA: 11/24/2016 PCP: Patient, No Pcp Per   Brief Narrative:  Victoria Middleton is a 36 y.o. female with a past medical history significant for depression with multiple suicide attempts in past, migraines and seizure disorder who presents with Zanaflex and Keppra overdose.  The patient has been under a lot of stress lately, was feeling overwhelmed today, depressed and unable to cope and so she "impulsively" took #25 Keppra IR 500 mg and #15 Zanaflex 4 mg around 4 or 5PM.  She then immediately told her fiance, who brought her to the ER.  She feels tired and somewhat sleepy/sedated, but no syncope, LOC, nausea, confusion, disorientation at any point.    Assessment & Plan:   Principal Problem:   Overdose Active Problems:   Major depressive disorder, recurrent episode, moderate (HCC)   Elevated serum creatinine   Seizure disorder (HCC)  1-Intentional Overdose, Zanaflex, and keppra;  Monitor for hypotension and bradycardia.  Could use Naloxone and or atropine for symptomatic bradycardia.  Continue with IV fluids.  Patient stable, HR stable. BP stable.  Medical stable to be transfer to inpatient psychiatric facility.   Suicidal:  Patient was IVC.  Psych consulted.  Medical clear for inpatient psychiatric facility.   Migraines; hold propanolol give hypotension and bradycardia.   Seizure;  Keppra on hold due to overdose.  Will start keppra tonight.   AKI;  Baseline as low as 0.9 in the past, but also as high as 1.2. Improving with IV fluids.  Resolved.   Back pain, acute on chronic.  Neuro exam non focal.  Tramadol PRN.   DVT prophylaxis: Lovenox Code Status: Full code.  Family Communication: care discussed with patient  Disposition Plan: medical clear, awaiting psych facility    Consultants:   Psych   Procedures:  none   Antimicrobials:   none   Subjective: She is feeling ok, alert in no  distress.  Complaining of back pain    Objective: Vitals:   11/25/16 1958 11/25/16 2000 11/25/16 2141 11/26/16 0504  BP:  131/77 140/82 119/79  Pulse:   82 68  Resp:  16 17 16   Temp: 98.8 F (37.1 C)  99.2 F (37.3 C) 98.1 F (36.7 C)  TempSrc: Oral  Oral Oral  SpO2:  97% 100% 100%  Weight:   (!) 138.9 kg (306 lb 3.5 oz)   Height:   5\' 8"  (1.727 m)     Intake/Output Summary (Last 24 hours) at 11/26/16 0831 Last data filed at 11/26/16 8119  Gross per 24 hour  Intake             3995 ml  Output             1800 ml  Net             2195 ml   Filed Weights   11/24/16 1847 11/25/16 0200 11/25/16 2141  Weight: 131.5 kg (290 lb) (!) 137.6 kg (303 lb 5.7 oz) (!) 138.9 kg (306 lb 3.5 oz)    Examination:  General exam: NAD Respiratory system: CTA Cardiovascular system: S 1, S 2 RRR Gastrointestinal system: soft, nt, nd Central nervous system: Alert and oriented. No focal neurological deficits. Extremities: Symmetric 5 x 5 power. Skin: No rashes, lesions or ulcers    Data Reviewed: I have personally reviewed following labs and imaging studies  CBC:  Recent Labs Lab 11/24/16 1906 11/25/16 0421 11/26/16 0552  WBC 14.8*  8.3 6.1  HGB 12.5 10.8* 11.3*  HCT 37.6 32.8* 34.5*  MCV 86.0 87.0 86.5  PLT 226 172 163   Basic Metabolic Panel:  Recent Labs Lab 11/24/16 1906 11/25/16 0421 11/26/16 0552  NA 136 139 140  K 4.9 3.8 3.5  CL 105 110 112*  CO2 22 22 21*  GLUCOSE 157* 105* 101*  BUN 17 15 12   CREATININE 1.35* 1.01* 0.97  CALCIUM 9.0 8.3* 8.1*   GFR: Estimated Creatinine Clearance: 117.7 mL/min (by C-G formula based on SCr of 0.97 mg/dL). Liver Function Tests:  Recent Labs Lab 11/24/16 1906  AST 26  ALT 13*  ALKPHOS 54  BILITOT 1.2  PROT 6.9  ALBUMIN 3.9   No results for input(s): LIPASE, AMYLASE in the last 168 hours. No results for input(s): AMMONIA in the last 168 hours. Coagulation Profile: No results for input(s): INR, PROTIME in the last  168 hours. Cardiac Enzymes: No results for input(s): CKTOTAL, CKMB, CKMBINDEX, TROPONINI in the last 168 hours. BNP (last 3 results) No results for input(s): PROBNP in the last 8760 hours. HbA1C: No results for input(s): HGBA1C in the last 72 hours. CBG:  Recent Labs Lab 11/24/16 1854  GLUCAP 162*   Lipid Profile: No results for input(s): CHOL, HDL, LDLCALC, TRIG, CHOLHDL, LDLDIRECT in the last 72 hours. Thyroid Function Tests: No results for input(s): TSH, T4TOTAL, FREET4, T3FREE, THYROIDAB in the last 72 hours. Anemia Panel: No results for input(s): VITAMINB12, FOLATE, FERRITIN, TIBC, IRON, RETICCTPCT in the last 72 hours. Sepsis Labs: No results for input(s): PROCALCITON, LATICACIDVEN in the last 168 hours.  Recent Results (from the past 240 hour(s))  MRSA PCR Screening     Status: None   Collection Time: 11/25/16  2:16 AM  Result Value Ref Range Status   MRSA by PCR NEGATIVE NEGATIVE Final    Comment:        The GeneXpert MRSA Assay (FDA approved for NASAL specimens only), is one component of a comprehensive MRSA colonization surveillance program. It is not intended to diagnose MRSA infection nor to guide or monitor treatment for MRSA infections.          Radiology Studies: No results found.      Scheduled Meds: . enoxaparin (LOVENOX) injection  60 mg Subcutaneous QHS  . levETIRAcetam  500 mg Oral BID  . sodium chloride flush  3 mL Intravenous Q12H   Continuous Infusions: . sodium chloride 125 mL/hr at 11/26/16 0129     LOS: 1 day    Time spent: 35 minutes.     Alba Coryegalado, Alura Olveda A, MD [facility] Pager [phone]  If 7PM-7AM, please contact night-coverage [email or web address] Password [email or web address] 11/26/2016, 8:31 AM

## 2016-11-27 ENCOUNTER — Encounter (HOSPITAL_COMMUNITY): Payer: Self-pay

## 2016-11-27 DIAGNOSIS — T50902A Poisoning by unspecified drugs, medicaments and biological substances, intentional self-harm, initial encounter: Secondary | ICD-10-CM | POA: Diagnosis not present

## 2016-11-27 MED ORDER — FERROUS SULFATE 325 (65 FE) MG PO TABS: 325.0000 mg | ORAL_TABLET | Freq: Two times a day (BID) | ORAL | 3 refills | Status: DC

## 2016-11-27 MED ORDER — FERROUS SULFATE 325 (65 FE) MG PO TABS
325.0000 mg | ORAL_TABLET | Freq: Two times a day (BID) | ORAL | Status: DC
  Administered 2016-11-27 – 2016-11-28 (×3): 325 mg via ORAL
  Filled 2016-11-27 (×3): qty 1

## 2016-11-27 NOTE — Clinical Social Work Psych Note (Signed)
Clinical Social Worker Psych Service Line Progress Note  Clinical Social Worker: Lia Hopping, LCSW Date/Time: 11/27/2016, 4:03 PM   Review of Patient  Overall Medical Condition:  Medically Stable for discharge to inpatient psychiatric hospital.   Participation Level:  Active Participation Quality: Appropriate Other Participation Quality:  Calm, Cooperative  Affect: Appropriate Cognitive: Appropriate Reaction to Medications/Concerns:  Patient reports she has not taken her medications for depression since 2014.   Modes of Intervention: Support, Exploration   Summary of Progress/Plan at Discharge  Summary of Progress/Plan at Discharge: CSW met with patient at bedside, explain role and reason for csw intervention. Patient agreeable to talk with CSW.  Patient reports she a history of depression with several hospitalization in the past, the last admission was in 2014.Patient report she lives in the home with her two children and parents. She reports over the past several weeks she had stressors that lead up to her overdosing. The patient reports she lost her job after having neck surgery, planning a wedding and in between homes with her fiance. Patient states after having argument with her parents "it was the last straw". Patient states, " the overdose was a wake up call and brought me back to reality, since being in the hospital I have really been missing my children."  Patient reports after loosing her job she lost her insurance and has recently been set up [facility] through [place]. The patient reports she has a scheduled appointment with her new primary care physician. Patient plans to reschedule.  Plan: Assist with discharge plan to inpatient psychiatric hospital.  Patient clinical information faxed to inpatient hospital. CSW will inform medical staff when bed is available.     Kathrin Greathouse, Latanya Presser, MSW Clinical Social Worker 5E and Psychiatric Service Line [phone] 11/27/2016   4:32 PM

## 2016-11-27 NOTE — Progress Notes (Signed)
CS[facility] [place] [facility] Number for report is: [phone] Pt's room/bed number will be: 5 A Pt's unit will be: Inpatient [facility] Accepting physician: Dr. Lacinda AxonAshras Mikhail  CSW will update RN and EDP.  Pt can arrive ASAP on 5/14 or after 10am on 5/15  Please call [facility] when pt is being transported.  Dorothe PeaJonathan F. Daronte Shostak, Theresia MajorsLCSWA, LCAS Clinical Social Worker Ph: [phone]

## 2016-11-27 NOTE — Discharge Summary (Signed)
Physician Discharge Summary  Clabe SealMelissa M Middleton UJW:[id number]:[id number] DOB: 1981/05/09 DOA: 11/24/2016  PCP: Patient, No Pcp Per  Admit date: 11/24/2016 Discharge date: 11/27/2016  Admitted From: Home Disposition:  Home   Recommendations for Outpatient Follow-up:  1. Follow up with PCP in 1-2 weeks 2. Please obtain BMP/CBC in one week 3. For anemia, needs to follow with GYN    Discharge Condition: stable.  CODE STATUS: full code.  Diet recommendation: Heart Healthy   Brief/Interim Summary: [name] Myersis a 36 y.o.femalewith a past medical history significant for depression with multiple suicide attempts in past, migraines and seizure disorderwho presents with Zanaflex and Keppra overdose.  The patient has been under a lot of stress lately, was feeling overwhelmed today, depressed and unable to cope and so she "impulsively" took #25 Keppra IR 500 mg and #15 Zanaflex 4 mg around 4 or 5PM. She then immediately told her fiance, who brought her to the ER. She feels tired and somewhat sleepy/sedated, but no syncope, LOC, nausea, confusion, disorientation at any point.    Assessment & Plan:   Principal Problem:   Overdose Active Problems:   Major depressive disorder, recurrent episode, moderate (HCC)   Elevated serum creatinine   Seizure disorder (HCC)  1-Intentional Overdose, Zanaflex, and keppra;  Monitor for hypotension and bradycardia.  Could use Naloxone and or atropine for symptomatic bradycardia.  Continue with IV fluids.  Patient stable, HR stable. BP stable.  Medical stable to be transfer to inpatient psychiatric facility.   Suicidal:  Patient was IVC.  Psych consulted.  Medical clear for inpatient psychiatric facility.   Migraines; hold propanolol give hypotension and bradycardia.   Seizure;  Keppra on hold due to overdose.  Resume keppra. Tolerating dose.   AKI;  Baseline as low as 0.9 in the past, but also as high as 1.2. Improving with IV fluids.   Resolved.   Back pain, acute on chronic.  Neuro exam non focal.  Tramadol PRN. Wouldn't t give this med at discharge.    Discharge Diagnoses:  Principal Problem:   Overdose Active Problems:   Major depressive disorder, recurrent episode, moderate (HCC)   Elevated serum creatinine   Seizure disorder [facility](HCC)    Discharge Instructions  Discharge Instructions    Diet - low sodium heart healthy    Complete by:  As directed    Increase activity slowly    Complete by:  As directed      Allergies as of 11/27/2016      Reactions   Aripiprazole Other (See Comments)   Reaction:  Seizures    Benadryl [diphenhydramine Hcl] Other (See Comments)   Reaction:  Jittery and restless    Ketorolac Tromethamine Rash   Naprosyn [naproxen] Rash   Penicillins Rash, Other (See Comments)   Has patient had a PCN reaction causing immediate rash, facial/tongue/throat swelling, SOB or lightheadedness with hypotension: No Has patient had a PCN reaction causing severe rash involving mucus membranes or skin necrosis: No Has patient had a PCN reaction that required hospitalization No Has patient had a PCN reaction occurring within the last 10 years: No If all of the above answers are "NO", then may proceed with Cephalosporin use.   Sulfa Antibiotics Rash      Medication List    STOP taking these medications   propranolol 20 MG tablet Commonly known as:  INDERAL   tiZANidine 4 MG tablet Commonly known as:  ZANAFLEX     TAKE these medications   acetaminophen 325 MG  tablet Commonly known as:  TYLENOL Take 2 tablets (650 mg total) by mouth every 6 (six) hours as needed for mild pain or moderate pain.   ferrous sulfate 325 (65 FE) MG tablet Take 1 tablet (325 mg total) by mouth 2 (two) times daily with a meal.   levETIRAcetam 500 MG tablet Commonly known as:  KEPPRA Take 500 mg by mouth 2 (two) times daily.   levonorgestrel 20 MCG/24HR IUD Commonly known as:  MIRENA 1 each by Intrauterine  route once.       Allergies  Allergen Reactions  . Aripiprazole Other (See Comments)    Reaction:  Seizures   . Benadryl [Diphenhydramine Hcl] Other (See Comments)    Reaction:  Jittery and restless   . Ketorolac Tromethamine Rash  . Naprosyn [Naproxen] Rash  . Penicillins Rash and Other (See Comments)    Has patient had a PCN reaction causing immediate rash, facial/tongue/throat swelling, SOB or lightheadedness with hypotension: No Has patient had a PCN reaction causing severe rash involving mucus membranes or skin necrosis: No Has patient had a PCN reaction that required hospitalization No Has patient had a PCN reaction occurring within the last 10 years: No If all of the above answers are "NO", then may proceed with Cephalosporin use.  . Sulfa Antibiotics Rash    Consultations:  Psych    Procedures/Studies:  No results found.    Subjective: Feeling ok, no complaints today   Discharge Exam: Vitals:   11/26/16 2209 11/27/16 0612  BP: 135/81 119/79  Pulse: 78 74  Resp: 18 16  Temp: 98.7 F (37.1 C) 98.4 F (36.9 C)   Vitals:   11/26/16 0504 11/26/16 1353 11/26/16 2209 11/27/16 0612  BP: 119/79 106/77 135/81 119/79  Pulse: 68 73 78 74  Resp: 16 18 18 16   Temp: 98.1 F (36.7 C) 98.9 F (37.2 C) 98.7 F (37.1 C) 98.4 F (36.9 C)  TempSrc: Oral Oral Oral Oral  SpO2: 100% 100% 99% 100%  Weight:      Height:        General: Pt is alert, awake, not in acute distress Cardiovascular: RRR, S1/S2 +, no rubs, no gallops Respiratory: CTA bilaterally, no wheezing, no rhonchi Abdominal: Soft, NT, ND, bowel sounds + Extremities: no edema, no cyanosis    The results of significant diagnostics from this hospitalization (including imaging, microbiology, ancillary and laboratory) are listed below for reference.     Microbiology: Recent Results (from the past 240 hour(s))  MRSA PCR Screening     Status: None   Collection Time: 11/25/16  2:16 AM  Result Value  Ref Range Status   MRSA by PCR NEGATIVE NEGATIVE Final    Comment:        The GeneXpert MRSA Assay (FDA approved for NASAL specimens only), is one component of a comprehensive MRSA colonization surveillance program. It is not intended to diagnose MRSA infection nor to guide or monitor treatment for MRSA infections.      Labs: BNP (last 3 results)  Recent Labs  10/23/16 0234  BNP 16.7   Basic Metabolic Panel:  Recent Labs Lab 11/24/16 1906 11/25/16 0421 11/26/16 0552  NA 136 139 140  K 4.9 3.8 3.5  CL 105 110 112*  CO2 22 22 21*  GLUCOSE 157* 105* 101*  BUN 17 15 12   CREATININE 1.35* 1.01* 0.97  CALCIUM 9.0 8.3* 8.1*   Liver Function Tests:  Recent Labs Lab 11/24/16 1906  AST 26  ALT 13*  ALKPHOS  54  BILITOT 1.2  PROT 6.9  ALBUMIN 3.9   No results for input(s): LIPASE, AMYLASE in the last 168 hours. No results for input(s): AMMONIA in the last 168 hours. CBC:  Recent Labs Lab 11/24/16 1906 11/25/16 0421 11/26/16 0552  WBC 14.8* 8.3 6.1  HGB 12.5 10.8* 11.3*  HCT 37.6 32.8* 34.5*  MCV 86.0 87.0 86.5  PLT 226 172 163   Cardiac Enzymes: No results for input(s): CKTOTAL, CKMB, CKMBINDEX, TROPONINI in the last 168 hours. BNP: Invalid input(s): POCBNP CBG:  Recent Labs Lab 11/24/16 1854  GLUCAP 162*   D-Dimer No results for input(s): DDIMER in the last 72 hours. Hgb A1c No results for input(s): HGBA1C in the last 72 hours. Lipid Profile No results for input(s): CHOL, HDL, LDLCALC, TRIG, CHOLHDL, LDLDIRECT in the last 72 hours. Thyroid function studies No results for input(s): TSH, T4TOTAL, T3FREE, THYROIDAB in the last 72 hours.  Invalid input(s): FREET3 Anemia work up No results for input(s): VITAMINB12, FOLATE, FERRITIN, TIBC, IRON, RETICCTPCT in the last 72 hours. Urinalysis    Component Value Date/Time   COLORURINE YELLOW 11/24/2016 2038   APPEARANCEUR CLEAR 11/24/2016 2038   LABSPEC 1.014 11/24/2016 2038   PHURINE 5.0  11/24/2016 2038   GLUCOSEU NEGATIVE 11/24/2016 2038   HGBUR NEGATIVE 11/24/2016 2038   BILIRUBINUR NEGATIVE 11/24/2016 2038   KETONESUR NEGATIVE 11/24/2016 2038   PROTEINUR NEGATIVE 11/24/2016 2038   UROBILINOGEN 0.2 08/27/2011 0221   NITRITE NEGATIVE 11/24/2016 2038   LEUKOCYTESUR NEGATIVE 11/24/2016 2038   Sepsis Labs Invalid input(s): PROCALCITONIN,  WBC,  LACTICIDVEN Microbiology Recent Results (from the past 240 hour(s))  MRSA PCR Screening     Status: None   Collection Time: 11/25/16  2:16 AM  Result Value Ref Range Status   MRSA by PCR NEGATIVE NEGATIVE Final    Comment:        The GeneXpert MRSA Assay (FDA approved for NASAL specimens only), is one component of a comprehensive MRSA colonization surveillance program. It is not intended to diagnose MRSA infection nor to guide or monitor treatment for MRSA infections.      Time coordinating discharge: Over 30 minutes  SIGNED:   Alba Cory, MD  [facility] 11/27/2016, 8:16 AM Pager [phone]  If 7PM-7AM, please contact night-coverage [email or web address] Password [email or web address]

## 2016-11-27 NOTE — Progress Notes (Signed)
Ref[facility] pt [facility] the following seeking [facility]  All other facilities contacted at capacity.  Ilean SkillMeghan Ashaya Raftery, MSW, LCSW Clinical Social Work 11/27/2016

## 2016-11-27 NOTE — Progress Notes (Addendum)
CSW received a call from pt's RN and updated RN, RN stated she will update provider.  RN will call non-emergency sheriff's transport due to pt being IVC'd and will attempt transport today.  Dorothe PeaJonathan F. Aydrian Halpin, Theresia MajorsLCSWA, LCAS Clinical Social Worker Ph: [phone]

## 2016-11-27 NOTE — Progress Notes (Signed)
CSW following to assist with patient discharge [facility] inpatient [facility]. Patient medically stable per physician.  Will inform medical staff when bed is available.   Victoria BarrackNicole Charlita Middleton, Theresia MajorsLCSWA, MSW Clinical Social Worker 5E and Psychiatric Service Line [phone] 11/27/2016  11:16 AM

## 2016-11-28 DIAGNOSIS — T50902A Poisoning by unspecified drugs, medicaments and biological substances, intentional self-harm, initial encounter: Secondary | ICD-10-CM | POA: Diagnosis not present

## 2016-11-28 NOTE — Progress Notes (Signed)
Patient was transported by sheriff [facility] a.[facility].  No other needs identified.   Vivi BarrackNicole Leah Thornberry, Theresia MajorsLCSWA, MSW Clinical Social Worker 5E and Psychiatric Service Line [phone] 11/28/2016  8:26 AM

## 2016-11-28 NOTE — Discharge Summary (Signed)
Physician Discharge Summary  Clabe SealMelissa M Middleton ZOX:[id number] DOB: 06/21/1981 DOA: 11/24/2016  PCP: Patient, No Pcp Per  Admit date: 11/24/2016 Discharge date: 11/28/2016  Admitted From: Home Disposition:  Home   Recommendations for Outpatient Follow-up:  1. Follow up with PCP in 1-2 weeks 2. Please obtain BMP/CBC in one week 3. For anemia, needs to follow with GYN    Discharge Condition: stable.  CODE STATUS: full code.  Diet recommendation: Heart Healthy   Brief/Interim Summary: Victoria SisMelissa M Myersis a 36 y.o.femalewith a past medical history significant for depression with multiple suicide attempts in past, migraines and seizure disorderwho presents with Zanaflex and Keppra overdose.  The patient has been under a lot of stress lately, was feeling overwhelmed today, depressed and unable to cope and so she "impulsively" took #25 Keppra IR 500 mg and #15 Zanaflex 4 mg around 4 or 5PM. She then immediately told her fiance, who brought her to the ER. She feels tired and somewhat sleepy/sedated, but no syncope, LOC, nausea, confusion, disorientation at any point.    Assessment & Plan:   Principal Problem:   Overdose Active Problems:   Major depressive disorder, recurrent episode, moderate (HCC)   Elevated serum creatinine   Seizure disorder (HCC)  1-Intentional Overdose, Zanaflex, and keppra;  Monitor for hypotension and bradycardia.  Could use Naloxone and or atropine for symptomatic bradycardia.  Continue with IV fluids.  Patient stable, HR stable. BP stable.  Medical stable to be transfer to inpatient psychiatric facility.  patient stable to be transfer to inpatient psychiatric.  No change in medical condition.   Suicidal:  Patient was IVC.  Psych consulted.  Medical clear for inpatient psychiatric facility.   Migraines; hold propanolol give hypotension and bradycardia.   Seizure;  Keppra on hold due to overdose.  Resume keppra. Tolerating dose.   AKI;   Baseline as low as 0.9 in the past, but also as high as 1.2. Improving with IV fluids.  Resolved.   Back pain, acute on chronic.  Neuro exam non focal.  Tramadol PRN. Wouldn't t give this med at discharge.    Discharge Diagnoses:  Principal Problem:   Overdose Active Problems:   Major depressive disorder, recurrent episode, moderate (HCC)   Elevated serum creatinine   Seizure disorder [facility](HCC)    Discharge Instructions  Discharge Instructions    Diet - low sodium heart healthy    Complete by:  As directed    Increase activity slowly    Complete by:  As directed      Allergies as of 11/28/2016      Reactions   Aripiprazole Other (See Comments)   Reaction:  Seizures    Benadryl [diphenhydramine Hcl] Other (See Comments)   Reaction:  Jittery and restless    Ketorolac Tromethamine Rash   Naprosyn [naproxen] Rash   Penicillins Rash, Other (See Comments)   Has patient had a PCN reaction causing immediate rash, facial/tongue/throat swelling, SOB or lightheadedness with hypotension: No Has patient had a PCN reaction causing severe rash involving mucus membranes or skin necrosis: No Has patient had a PCN reaction that required hospitalization No Has patient had a PCN reaction occurring within the last 10 years: No If all of the above answers are "NO", then may proceed with Cephalosporin use.   Sulfa Antibiotics Rash      Medication List    STOP taking these medications   propranolol 20 MG tablet Commonly known as:  INDERAL   tiZANidine 4 MG tablet Commonly known  as:  ZANAFLEX     TAKE these medications   acetaminophen 325 MG tablet Commonly known as:  TYLENOL Take 2 tablets (650 mg total) by mouth every 6 (six) hours as needed for mild pain or moderate pain.   ferrous sulfate 325 (65 FE) MG tablet Take 1 tablet (325 mg total) by mouth 2 (two) times daily with a meal.   levETIRAcetam 500 MG tablet Commonly known as:  KEPPRA Take 500 mg by mouth 2 (two) times  daily.   levonorgestrel 20 MCG/24HR IUD Commonly known as:  MIRENA 1 each by Intrauterine route once.       Allergies  Allergen Reactions  . Aripiprazole Other (See Comments)    Reaction:  Seizures   . Benadryl [Diphenhydramine Hcl] Other (See Comments)    Reaction:  Jittery and restless   . Ketorolac Tromethamine Rash  . Naprosyn [Naproxen] Rash  . Penicillins Rash and Other (See Comments)    Has patient had a PCN reaction causing immediate rash, facial/tongue/throat swelling, SOB or lightheadedness with hypotension: No Has patient had a PCN reaction causing severe rash involving mucus membranes or skin necrosis: No Has patient had a PCN reaction that required hospitalization No Has patient had a PCN reaction occurring within the last 10 years: No If all of the above answers are "NO", then may proceed with Cephalosporin use.  . Sulfa Antibiotics Rash    Consultations:  Psych    Procedures/Studies: No results found.    Subjective: Feeling ok, no complaints today   Discharge Exam: Vitals:   11/27/16 2300 11/28/16 0600  BP: 131/75 (!) 145/87  Pulse: 67 72  Resp:  15  Temp:  98.4 F (36.9 C)   Vitals:   11/27/16 1429 11/27/16 2117 11/27/16 2300 11/28/16 0600  BP: 121/81 (!) 168/110 131/75 (!) 145/87  Pulse: 74 69 67 72  Resp: 15 16  15   Temp: 98.2 F (36.8 C) 98.7 F (37.1 C)  98.4 F (36.9 C)  TempSrc: Oral Oral  Oral  SpO2: 100% 99% 100% 99%  Weight:      Height:        General: Pt is alert, awake, not in acute distress Cardiovascular: RRR, S1/S2 +, no rubs, no gallops Respiratory: CTA bilaterally, no wheezing, no rhonchi Abdominal: Soft, NT, ND, bowel sounds + Extremities: no edema, no cyanosis    The results of significant diagnostics from this hospitalization (including imaging, microbiology, ancillary and laboratory) are listed below for reference.     Microbiology: Recent Results (from the past 240 hour(s))  MRSA PCR Screening      Status: None   Collection Time: 11/25/16  2:16 AM  Result Value Ref Range Status   MRSA by PCR NEGATIVE NEGATIVE Final    Comment:        The GeneXpert MRSA Assay (FDA approved for NASAL specimens only), is one component of a comprehensive MRSA colonization surveillance program. It is not intended to diagnose MRSA infection nor to guide or monitor treatment for MRSA infections.      Labs: BNP (last 3 results)  Recent Labs  10/23/16 0234  BNP 16.7   Basic Metabolic Panel:  Recent Labs Lab 11/24/16 1906 11/25/16 0421 11/26/16 0552  NA 136 139 140  K 4.9 3.8 3.5  CL 105 110 112*  CO2 22 22 21*  GLUCOSE 157* 105* 101*  BUN 17 15 12   CREATININE 1.35* 1.01* 0.97  CALCIUM 9.0 8.3* 8.1*   Liver Function Tests:  Recent Labs  Lab 11/24/16 1906  AST 26  ALT 13*  ALKPHOS 54  BILITOT 1.2  PROT 6.9  ALBUMIN 3.9   No results for input(s): LIPASE, AMYLASE in the last 168 hours. No results for input(s): AMMONIA in the last 168 hours. CBC:  Recent Labs Lab 11/24/16 1906 11/25/16 0421 11/26/16 0552  WBC 14.8* 8.3 6.1  HGB 12.5 10.8* 11.3*  HCT 37.6 32.8* 34.5*  MCV 86.0 87.0 86.5  PLT 226 172 163   Cardiac Enzymes: No results for input(s): CKTOTAL, CKMB, CKMBINDEX, TROPONINI in the last 168 hours. BNP: Invalid input(s): POCBNP CBG:  Recent Labs Lab 11/24/16 1854  GLUCAP 162*   D-Dimer No results for input(s): DDIMER in the last 72 hours. Hgb A1c No results for input(s): HGBA1C in the last 72 hours. Lipid Profile No results for input(s): CHOL, HDL, LDLCALC, TRIG, CHOLHDL, LDLDIRECT in the last 72 hours. Thyroid function studies No results for input(s): TSH, T4TOTAL, T3FREE, THYROIDAB in the last 72 hours.  Invalid input(s): FREET3 Anemia work up No results for input(s): VITAMINB12, FOLATE, FERRITIN, TIBC, IRON, RETICCTPCT in the last 72 hours. Urinalysis    Component Value Date/Time   COLORURINE YELLOW 11/24/2016 2038   APPEARANCEUR CLEAR  11/24/2016 2038   LABSPEC 1.014 11/24/2016 2038   PHURINE 5.0 11/24/2016 2038   GLUCOSEU NEGATIVE 11/24/2016 2038   HGBUR NEGATIVE 11/24/2016 2038   BILIRUBINUR NEGATIVE 11/24/2016 2038   KETONESUR NEGATIVE 11/24/2016 2038   PROTEINUR NEGATIVE 11/24/2016 2038   UROBILINOGEN 0.2 08/27/2011 0221   NITRITE NEGATIVE 11/24/2016 2038   LEUKOCYTESUR NEGATIVE 11/24/2016 2038   Sepsis Labs Invalid input(s): PROCALCITONIN,  WBC,  LACTICIDVEN Microbiology Recent Results (from the past 240 hour(s))  MRSA PCR Screening     Status: None   Collection Time: 11/25/16  2:16 AM  Result Value Ref Range Status   MRSA by PCR NEGATIVE NEGATIVE Final    Comment:        The GeneXpert MRSA Assay (FDA approved for NASAL specimens only), is one component of a comprehensive MRSA colonization surveillance program. It is not intended to diagnose MRSA infection nor to guide or monitor treatment for MRSA infections.      Time coordinating discharge: Over 30 minutes  SIGNED:   Alba Cory, MD  [facility] 11/28/2016, 8:47 AM Pager [phone]  If 7PM-7AM, please contact night-coverage [email or web address] Password [email or web address]

## 2016-11-28 NOTE — Progress Notes (Signed)
[facility], pt instable incondition. Am meds given. Fiance stopped by to leave clothing with pt. 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

## 2016-12-09 ENCOUNTER — Emergency Department (HOSPITAL_COMMUNITY): Payer: Self-pay

## 2016-12-09 ENCOUNTER — Encounter (HOSPITAL_COMMUNITY): Payer: Self-pay | Admitting: *Deleted

## 2016-12-09 ENCOUNTER — Emergency Department (HOSPITAL_COMMUNITY)
Admission: EM | Admit: 2016-12-09 | Discharge: 2016-12-10 | Disposition: A | Discharge status: Home / Self Care | Payer: Self-pay | Attending: Emergency Medicine | Admitting: Emergency Medicine

## 2016-12-09 DIAGNOSIS — Y93E1 Activity, personal bathing and showering: Secondary | ICD-10-CM | POA: Insufficient documentation

## 2016-12-09 DIAGNOSIS — S6992XA Unspecified injury of left wrist, hand and finger(s), initial encounter: Secondary | ICD-10-CM | POA: Insufficient documentation

## 2016-12-09 DIAGNOSIS — R55 Syncope and collapse: Secondary | ICD-10-CM | POA: Insufficient documentation

## 2016-12-09 DIAGNOSIS — Z79899 Other long term (current) drug therapy: Secondary | ICD-10-CM | POA: Insufficient documentation

## 2016-12-09 DIAGNOSIS — W182XXA Fall in (into) shower or empty bathtub, initial encounter: Secondary | ICD-10-CM | POA: Insufficient documentation

## 2016-12-09 DIAGNOSIS — R109 Unspecified abdominal pain: Secondary | ICD-10-CM | POA: Insufficient documentation

## 2016-12-09 DIAGNOSIS — Y999 Unspecified external cause status: Secondary | ICD-10-CM | POA: Insufficient documentation

## 2016-12-09 DIAGNOSIS — R319 Hematuria, unspecified: Secondary | ICD-10-CM | POA: Insufficient documentation

## 2016-12-09 DIAGNOSIS — Y929 Unspecified place or not applicable: Secondary | ICD-10-CM | POA: Insufficient documentation

## 2016-12-09 LAB — BASIC METABOLIC PANEL
ANION GAP: 8 (ref 5–15)
BUN: 7 mg/dL (ref 6–20)
CALCIUM: 8.7 mg/dL — AB (ref 8.9–10.3)
CO2: 25 mmol/L (ref 22–32)
CREATININE: 1.03 mg/dL — AB (ref 0.44–1.00)
Chloride: 102 mmol/L (ref 101–111)
GLUCOSE: 98 mg/dL (ref 65–99)
Potassium: 4 mmol/L (ref 3.5–5.1)
Sodium: 135 mmol/L (ref 135–145)

## 2016-12-09 LAB — URINALYSIS, ROUTINE W REFLEX MICROSCOPIC
Bacteria, UA: NONE SEEN
Bilirubin Urine: NEGATIVE
GLUCOSE, UA: NEGATIVE mg/dL
Ketones, ur: NEGATIVE mg/dL
Leukocytes, UA: NEGATIVE
NITRITE: NEGATIVE
PH: 5 (ref 5.0–8.0)
Protein, ur: NEGATIVE mg/dL
Specific Gravity, Urine: 1.018 (ref 1.005–1.030)

## 2016-12-09 LAB — CBC
HCT: 39.8 % (ref 36.0–46.0)
Hemoglobin: 13.2 g/dL (ref 12.0–15.0)
MCH: 28.7 pg (ref 26.0–34.0)
MCHC: 33.2 g/dL (ref 30.0–36.0)
MCV: 86.5 fL (ref 78.0–100.0)
PLATELETS: 221 10*3/uL (ref 150–400)
RBC: 4.6 MIL/uL (ref 3.87–5.11)
RDW: 12.7 % (ref 11.5–15.5)
WBC: 7.3 10*3/uL (ref 4.0–10.5)

## 2016-12-09 LAB — I-STAT BETA HCG BLOOD, ED (MC, WL, AP ONLY): I-stat hCG, quantitative: <5 m[IU]/mL (ref <5)

## 2016-12-09 MED ORDER — SODIUM CHLORIDE 0.9 % IV BOLUS (SEPSIS)
1000.0000 mL | Freq: Once | INTRAVENOUS | Status: AC
  Administered 2016-12-09: 1000 mL via INTRAVENOUS

## 2016-12-09 MED ORDER — OXYCODONE-ACETAMINOPHEN 5-325 MG PO TABS
2.0000 | ORAL_TABLET | Freq: Once | ORAL | Status: AC
  Administered 2016-12-09: 2 via ORAL
  Filled 2016-12-09: qty 2

## 2016-12-09 NOTE — ED Notes (Signed)
ED Provider at bedside. 

## 2016-12-09 NOTE — ED Notes (Addendum)
Pt reported feeling dizzy upon standing, and had to keep eyes closed to keep balance. When this EMT asked how bad the dizziness was, Pt stated: "Pretty bad"

## 2016-12-09 NOTE — ED Notes (Signed)
Patient eating pizza in room when this RN in room for IV placement

## 2016-12-09 NOTE — ED Notes (Signed)
Patient transported to CT 

## 2016-12-09 NOTE — ED Triage Notes (Signed)
Pt reports hx of kidney stones, having right side flank pain since yesterday with blood in urine. Reports being in the shower today when she had severe sharp pain and then had syncopal episode. Hit left side of head on faucet, landed on right side and has left wrist pain.

## 2016-12-09 NOTE — ED Notes (Signed)
This RN attempted Iv access without success. Another RN to try

## 2016-12-09 NOTE — ED Provider Notes (Signed)
[facility]-EMERGENCY DEPT Provider Note   CSN: [id number] Arrival date & time: 12/09/16  1755    History   Chief Complaint Chief Complaint  Patient presents with  . Flank Pain  . Loss of Consciousness    HPI Victoria Middleton is a 36 y.o. female.  36 year old female with a history of depression, headaches, kidney stones, and seizures presents to the emergency department for evaluation of flank pain. She reports right-sided flank pain which has been intermittent and gradually worsening since yesterday morning. She has taken Tylenol for pain without relief. Patient endorses associated mild hematuria. No passing of clots. She also states that she vomited twice this morning due to pain. No fevers or dysuria. Abdominal surgical hx significant for cholecystectomy, appendectomy, and hernia repair.  Patient notes acute worsening of her pain while in the shower this evening. She states that it caused her to have a syncopal episode. She notes hitting the left side of her forehead on the faucet. She has been experiencing pain in her left wrist since. No numbness or paresthesias. No vomiting since the fall.      Past Medical History:  Diagnosis Date  . Depression   . Headache(784.0)   . Kidney stones   . Migraine   . Seizures [facility](HCC)     Patient Active Problem List   Diagnosis Date Noted  . Overdose 11/24/2016  . Elevated serum creatinine 11/24/2016  . Seizure disorder (HCC) 08/24/2016  . Intractable chronic migraine without aura and with status migrainosus 01/24/2016  . Major depressive disorder, recurrent episode, moderate (HCC) 09/13/2012    Past Surgical History:  Procedure Laterality Date  . ANKLE SURGERY     left  . ANTERIOR CERVICAL DECOMP/DISCECTOMY FUSION  2018  . APPENDECTOMY    . CHOLECYSTECTOMY    . KNEE SURGERY    . TONSILLECTOMY      OB History    Gravida Para Term Preterm AB Living   1             SAB TAB Ectopic Multiple Live Births                   Home  Medications    Prior to Admission medications   Medication Sig Start Date End Date Taking? Authorizing Provider  FLUoxetine (PROZAC) 20 MG capsule Take 40 mg by mouth daily.   Yes [provider]  hydrOXYzine (ATARAX/VISTARIL) 50 MG tablet Take 50 mg by mouth every 6 (six) hours as needed for anxiety.   Yes [provider]  levETIRAcetam (KEPPRA) 500 MG tablet Take 500 mg by mouth 2 (two) times daily.   Yes [provider]  levonorgestrel (MIRENA) 20 MCG/24HR IUD 1 each by Intrauterine route once.   Yes [provider]  LORazepam (ATIVAN) 0.5 MG tablet Take 0.5 mg by mouth 2 (two) times daily.   Yes [provider]  propranolol (INDERAL) 20 MG tablet Take 20 mg by mouth 2 (two) times daily.   Yes [provider]  traZODone (DESYREL) 100 MG tablet Take 100 mg by mouth at bedtime.   Yes [provider]  acetaminophen (TYLENOL) 325 MG tablet Take 2 tablets (650 mg total) by mouth every 6 (six) hours as needed for mild pain or moderate pain. 12/10/16   Antony MaduraHumes, Raymondo Garcialopez, PA-C  lidocaine (LIDODERM) 5 % Place 1 patch onto the skin daily. Remove & Discard patch within 12 hours or as directed by MD 12/10/16   Antony MaduraHumes, King Pinzon, PA-C  traMADol Janean Sark(ULTRAM)  50 MG tablet Take 1 tablet (50 mg total) by mouth every 6 (six) hours as needed. 12/10/16   Antony Madura, PA-C    Family History Family History  Problem Relation Age of Onset  . Obesity Mother   . Diabetes Mother   . Hypertension Mother   . Depression Mother   . Suicidality Mother   . Diabetes Father   . Hypertension Father   . Heart attack Father     Social History Social History  Substance Use Topics  . Smoking status: Never Smoker  . Smokeless tobacco: Never Used  . Alcohol use Yes     Comment: Rare     Allergies   Compazine [prochlorperazine edisylate]; Aripiprazole; Benadryl [diphenhydramine hcl]; Ketorolac tromethamine; Naprosyn [naproxen]; Penicillins; and Sulfa  antibiotics   Review of Systems Review of Systems Ten systems reviewed and are negative for acute change, except as noted in the HPI.    Physical Exam Updated Vital Signs BP 110/65   Pulse 78   Temp 98.2 F (36.8 C) (Oral)   Resp 18   LMP 11/29/2016   SpO2 99%   Physical Exam  Constitutional: She is oriented to person, place, and time. She appears well-developed and well-nourished. No distress.  Nontoxic and in NAD  HENT:  Head: Normocephalic and atraumatic.  No battle's sign or racoon's eyes. No skull instability or evidence of acute head trauma.  Eyes: Conjunctivae and EOM are normal. No scleral icterus.  Neck: Normal range of motion.  Cardiovascular: Normal rate, regular rhythm and intact distal pulses.   Pulmonary/Chest: Effort normal. No respiratory distress. She has no wheezes. She has no rales.  Respirations even and unlabored.   Abdominal:  Obese, nondistended abdomen.  Musculoskeletal: Normal range of motion.  TTP to left wrist along the radial aspect. Normal ROM. No significant swelling. No crepitus or deformity.  Neurological: She is alert and oriented to person, place, and time. She exhibits normal muscle tone. Coordination normal.  GCS 15. Patient moving all extremities.  Skin: Skin is warm and dry. No rash noted. She is not diaphoretic. No erythema. No pallor.  Psychiatric: She has a normal mood and affect. Her behavior is normal.  Nursing note and vitals reviewed.    ED Treatments / Results  Labs (all labs ordered are listed, but only abnormal results are displayed) Labs Reviewed  BASIC METABOLIC PANEL - Abnormal; Notable for the following:       Result Value   Creatinine, Ser 1.03 (*)    Calcium 8.7 (*)    All other components within normal limits  URINALYSIS, ROUTINE W REFLEX MICROSCOPIC - Abnormal; Notable for the following:    Hgb urine dipstick LARGE (*)    Squamous Epithelial / LPF 0-5 (*)    All other components within normal limits  CBC   RAPID URINE DRUG SCREEN, HOSP PERFORMED  I-STAT BETA HCG BLOOD, ED ([facility], [facility], [facility] ONLY)    EKG  EKG Interpretation None       Radiology Dg Wrist Complete Left  Result Date: 12/09/2016 CLINICAL DATA:  Status post fall in shower, left wrist injury. EXAM: LEFT WRIST - COMPLETE 3+ VIEW COMPARISON:  None. FINDINGS: There is no evidence of fracture or dislocation. There is no evidence of arthropathy or other focal bone abnormality. Soft tissues are unremarkable. IMPRESSION: Negative. Electronically Signed   By: Bary Richard M.D.   On: 12/09/2016 18:38   Ct Renal Stone Study  Result Date: 12/09/2016 CLINICAL DATA:  Acute onset of  right flank pain and hematuria. Initial encounter. EXAM: CT ABDOMEN AND PELVIS WITHOUT CONTRAST TECHNIQUE: Multidetector CT imaging of the abdomen and pelvis was performed following the standard protocol without IV contrast. COMPARISON:  CT of the abdomen and pelvis, and pelvic ultrasound performed 04/14/2016 FINDINGS: Lower chest: Minimal bibasilar atelectasis is noted. The visualized portions of the mediastinum are unremarkable. Hepatobiliary: The liver is unremarkable in appearance. The patient is status post cholecystectomy, with clips noted at the gallbladder fossa. The common bile duct remains normal in caliber. Pancreas: The pancreas is within normal limits. Spleen: The spleen is enlarged, measuring 15.3 cm in length. Adrenals/Urinary Tract: The adrenal glands are unremarkable in appearance. The kidneys are within normal limits. There is no evidence of hydronephrosis. No renal or ureteral stones are identified. No perinephric stranding is seen. Stomach/Bowel: The stomach is unremarkable in appearance. The small bowel is within normal limits. The patient is status post appendectomy. The colon is unremarkable in appearance. Vascular/Lymphatic: The abdominal aorta is unremarkable in appearance. The inferior vena cava is grossly unremarkable. No retroperitoneal lymphadenopathy  is seen. No pelvic sidewall lymphadenopathy is identified. Reproductive: The bladder is mildly distended and grossly unremarkable. The patient's intrauterine device is noted in abnormal angulated position at the lower uterine segment and cervix, unchanged from 2014. The uterus is grossly unremarkable. The ovaries are relatively symmetric. No suspicious adnexal masses are seen. Other: An anterior abdominal wall mesh is noted at the right lower quadrant. Musculoskeletal: No acute osseous abnormalities are identified. The visualized musculature is unremarkable in appearance. IMPRESSION: 1. No acute abnormality seen within the abdomen or pelvis. 2. Intrauterine device again noted in abnormal angulated position at the lower uterine segment and cervix, unchanged from 2014. OB/GYN consultation is recommended for repositioning. 3. Splenomegaly. Electronically Signed   By: Roanna Raider M.D.   On: 12/09/2016 23:09    Procedures Procedures (including critical care time)  Medications Ordered in ED Medications  sodium chloride 0.9 % bolus 1,000 mL (0 mLs Intravenous Stopped 12/09/16 2236)  oxyCODONE-acetaminophen (PERCOCET/ROXICET) 5-325 MG per tablet 2 tablet (2 tablets Oral Given 12/09/16 2049)  HYDROmorphone (DILAUDID) injection 0.5 mg (0.5 mg Intravenous Given 12/10/16 0024)  methocarbamol (ROBAXIN) tablet 750 mg (750 mg Oral Given 12/10/16 0024)     Initial Impression / Assessment and Plan / ED Course  I have reviewed the triage vital signs and the nursing notes.  Pertinent labs & imaging results that were available during my care of the patient were reviewed by me and considered in my medical decision making (see chart for details).     36 year old female presents to the emergency department for evaluation of flank pain. She states that symptoms feel consistent with prior kidney stones. Creatinine is at baseline. There is evidence of hematuria. No pyuria. Patient also reports a syncopal episode at home  with worsening pain. This is consistent with vasovagal syncope. Orthostatic vital signs are stable in the emergency department. Patient hydrated.  CT renal study obtained. This shows no evidence of kidney stone or hydronephrosis. Suspicion lower for kidney stone given reassuring imaging. Patient also had a negative scan in September 2017 for similar symptoms in the presence of hematuria.  Plan for outpatient management and pain control here he cannot exclude musculoskeletal etiology. Medications for home management are limited given history of intentional overdose. Will continue with Lidoderm patch and Tylenol. Short course of tramadol given. Patient has been advised to follow-up with her primary care doctor regarding her visit today. Return precautions discussed and  provided. Patient discharged in stable condition.   Final Clinical Impressions(s) / ED Diagnoses   Final diagnoses:  Right flank pain  Hematuria, unspecified type    New Prescriptions New Prescriptions   LIDOCAINE (LIDODERM) 5 %    Place 1 patch onto the skin daily. Remove & Discard patch within 12 hours or as directed by MD   TRAMADOL (ULTRAM) 50 MG TABLET    Take 1 tablet (50 mg total) by mouth every 6 (six) hours as needed.     Antony Madura, PA-C 12/10/16 0101    Linwood Dibbles, MD 12/10/16 [phone]

## 2016-12-10 MED ORDER — METHOCARBAMOL 500 MG PO TABS
750.0000 mg | ORAL_TABLET | Freq: Once | ORAL | Status: AC
  Administered 2016-12-10: 750 mg via ORAL
  Filled 2016-12-10: qty 2

## 2016-12-10 MED ORDER — LIDOCAINE 5 % EX PTCH: 1.0000 | MEDICATED_PATCH | CUTANEOUS | 0 refills | Status: DC

## 2016-12-10 MED ORDER — HYDROMORPHONE HCL 1 MG/ML IJ SOLN
0.5000 mg | Freq: Once | INTRAMUSCULAR | Status: AC
  Administered 2016-12-10: 0.5 mg via INTRAVENOUS
  Filled 2016-12-10: qty 1

## 2016-12-10 MED ORDER — TRAMADOL HCL 50 MG PO TABS: 50.0000 mg | ORAL_TABLET | Freq: Four times a day (QID) | ORAL | 0 refills | Status: DC | PRN

## 2016-12-10 MED ORDER — ACETAMINOPHEN 325 MG PO TABS: 650.0000 mg | ORAL_TABLET | Freq: Four times a day (QID) | ORAL | 0 refills | Status: DC | PRN

## 2016-12-10 NOTE — Discharge Instructions (Signed)
Your CT today did not show evidence of a kidney stone or hydronephrosis which is a backup of urine in your kidney. Hydronephrosis is often present when you're passing a kidney stone. Your kidney function is normal. You do have some blood in your urine, but no evidence of urinary tract infection. It is possible that your symptoms may be due to muscular pain. We advise continued use of Tylenol. You may use a Lidoderm patch topically for continued pain, as needed. If pain persists, you may take tramadol. Take this only as prescribed. Given your history of overdose, you must follow-up with your primary care doctor should you desire any additional pain medications.

## 2016-12-15 ENCOUNTER — Emergency Department (HOSPITAL_COMMUNITY): Payer: Self-pay

## 2016-12-15 ENCOUNTER — Encounter (HOSPITAL_COMMUNITY): Payer: Self-pay | Admitting: Emergency Medicine

## 2016-12-15 ENCOUNTER — Emergency Department (HOSPITAL_COMMUNITY)
Admission: EM | Admit: 2016-12-15 | Discharge: 2016-12-16 | Disposition: A | Discharge status: Home / Self Care | Payer: Self-pay | Attending: Emergency Medicine | Admitting: Emergency Medicine

## 2016-12-15 DIAGNOSIS — Z880 Allergy status to penicillin: Secondary | ICD-10-CM | POA: Insufficient documentation

## 2016-12-15 DIAGNOSIS — Z79899 Other long term (current) drug therapy: Secondary | ICD-10-CM | POA: Insufficient documentation

## 2016-12-15 DIAGNOSIS — M25561 Pain in right knee: Secondary | ICD-10-CM | POA: Insufficient documentation

## 2016-12-15 MED ORDER — TRAMADOL HCL 50 MG PO TABS: 50.0000 mg | ORAL_TABLET | Freq: Four times a day (QID) | ORAL | 0 refills | Status: DC | PRN

## 2016-12-15 MED ORDER — OXYCODONE-ACETAMINOPHEN 5-325 MG PO TABS
1.0000 | ORAL_TABLET | Freq: Once | ORAL | Status: AC
  Administered 2016-12-15: 1 via ORAL
  Filled 2016-12-15: qty 1

## 2016-12-15 NOTE — ED Notes (Signed)
Pt given a ice pack and placed it on her rt knee

## 2016-12-15 NOTE — ED Notes (Signed)
Ortho at bedside.

## 2016-12-15 NOTE — ED Provider Notes (Signed)
[facility]-EMERGENCY DEPT Provider Note   CSN: [id number] Arrival date & time: 12/15/16  1905  By signing my name below, I, Freida Busman, attest that this documentation has been prepared under the direction and in the presence of Kerrie Buffalo, NP. Electronically Signed: Freida Busman, Scribe. 12/15/2016. 11:28 PM.  History   Chief Complaint Chief Complaint  Patient presents with  . Knee Pain    The history is provided by the patient. No language interpreter was used.  Knee Pain   This is a new problem. The current episode started 3 to 5 hours ago. The problem has not changed since onset.The pain is present in the right knee. The pain is moderate. Pertinent negatives include no numbness. She has tried nothing for the symptoms.    HPI Comments:  Victoria Middleton is a 36 y.o. female with hx of right knee surgery several years ago for what she thinks was a torn meniscus and then a second surgery to to scar tissue who presents to the Emergency Department complaining of moderate right knee pain which began today. Pt states she was walking when she felt a pop. Associated swelling noted to the knee. She denies other injury. Pt has had difficulty bearing weight on the extremity since onset of pain.   Past Medical History:  Diagnosis Date  . Depression   . Headache(784.0)   . Kidney stones   . Migraine   . Seizures [facility])     Patient Active Problem List   Diagnosis Date Noted  . Overdose 11/24/2016  . Elevated serum creatinine 11/24/2016  . Seizure disorder (HCC) 08/24/2016  . Intractable chronic migraine without aura and with status migrainosus 01/24/2016  . Major depressive disorder, recurrent episode, moderate (HCC) 09/13/2012    Past Surgical History:  Procedure Laterality Date  . ANKLE SURGERY     left  . ANTERIOR CERVICAL DECOMP/DISCECTOMY FUSION  2018  . APPENDECTOMY    . CHOLECYSTECTOMY    . KNEE SURGERY    . TONSILLECTOMY      OB History    Gravida Para Term Preterm AB Living    1             SAB TAB Ectopic Multiple Live Births                   Home Medications    Prior to Admission medications   Medication Sig Start Date End Date Taking? Authorizing Provider  acetaminophen (TYLENOL) 325 MG tablet Take 2 tablets (650 mg total) by mouth every 6 (six) hours as needed for mild pain or moderate pain. 12/10/16   Antony Madura, PA-C  FLUoxetine (PROZAC) 20 MG capsule Take 40 mg by mouth daily.    [provider]  hydrOXYzine (ATARAX/VISTARIL) 50 MG tablet Take 50 mg by mouth every 6 (six) hours as needed for anxiety.    [provider]  levETIRAcetam (KEPPRA) 500 MG tablet Take 500 mg by mouth 2 (two) times daily.    [provider]  levonorgestrel (MIRENA) 20 MCG/24HR IUD 1 each by Intrauterine route once.    [provider]  lidocaine (LIDODERM) 5 % Place 1 patch onto the skin daily. Remove & Discard patch within 12 hours or as directed by MD 12/10/16   Antony Madura, PA-C  LORazepam (ATIVAN) 0.5 MG tablet Take 0.5 mg by mouth 2 (two) times daily.    [provider]  propranolol (INDERAL) 20 MG tablet Take 20 mg by mouth 2 (two) times daily.  [provider]  traMADol (ULTRAM) 50 MG tablet Take 1 tablet (50 mg total) by mouth every 6 (six) hours as needed. 12/15/16   Janne Napoleon, NP  traZODone (DESYREL) 100 MG tablet Take 100 mg by mouth at bedtime.    [provider]    Family History Family History  Problem Relation Age of Onset  . Obesity Mother   . Diabetes Mother   . Hypertension Mother   . Depression Mother   . Suicidality Mother   . Diabetes Father   . Hypertension Father   . Heart attack Father     Social History Social History  Substance Use Topics  . Smoking status: Never Smoker  . Smokeless tobacco: Never Used  . Alcohol use Yes     Comment: Rare     Allergies   Compazine [prochlorperazine edisylate]; Aripiprazole; Benadryl [diphenhydramine hcl]; Ketorolac tromethamine;  Naprosyn [naproxen]; Penicillins; and Sulfa antibiotics   Review of Systems Review of Systems  Constitutional: Negative for activity change.  HENT: Negative.   Gastrointestinal: Negative for nausea.  Musculoskeletal: Positive for arthralgias and joint swelling.  Skin: Negative for wound.  Neurological: Negative for weakness and numbness.  Psychiatric/Behavioral: The patient is not nervous/anxious.      Physical Exam Updated Vital Signs BP 125/89 (BP Location: Right Arm)   Pulse 66   Temp 98.4 F (36.9 C) (Oral)   Resp 18   Ht 5\' 8"  (1.727 m)   Wt 136.1 kg (300 lb)   LMP 11/29/2016   SpO2 98%   BMI 45.61 kg/m   Physical Exam  Constitutional: She is oriented to person, place, and time. No distress.  obese  HENT:  Head: Normocephalic and atraumatic.  Eyes: Conjunctivae are normal.  Neck: Neck supple.  Cardiovascular: Normal rate.   2+ DP pulses with adequate circulation   Pulmonary/Chest: Effort normal.  Musculoskeletal:  Pain with flexion and extension of the right knee Limited ROM due to pain  Tender over right patella. Pedal pulse 2+, adequate circulation. Good touch sensation.  Neurological: She is alert and oriented to person, place, and time.  Skin: Skin is warm and dry. Capillary refill takes less than 2 seconds.  Psychiatric: She has a normal mood and affect.  Nursing note and vitals reviewed.    ED Treatments / Results  DIAGNOSTIC STUDIES:  Oxygen Saturation is 99% on RA, normal by my interpretation.    COORDINATION OF CARE:  11:28 PM Discussed treatment plan with pt at bedside and pt agreed to plan.  Labs (all labs ordered are listed, but only abnormal results are displayed) Labs Reviewed - No data to display Radiology Dg Knee Complete 4 Views Right  Result Date: 12/15/2016 CLINICAL DATA:  Popping noise in knee while walking, initial encounter EXAM: RIGHT KNEE - COMPLETE 4+ VIEW COMPARISON:  None. FINDINGS: Mild osteophytic changes are seen in  all 3 joint compartments. No joint effusion is seen. No acute fracture or dislocation is noted. The soft tissue abnormality is seen. IMPRESSION: Mild degenerative change without acute abnormality. Electronically Signed   By: Alcide Clever M.D.   On: 12/15/2016 19:42    Procedures Procedures (including critical care time)  Medications Ordered in ED Medications  oxyCODONE-acetaminophen (PERCOCET/ROXICET) 5-325 MG per tablet 1 tablet (1 tablet Oral Given 12/15/16 2356)     Initial Impression / Assessment and Plan / ED Course  I have reviewed the triage vital signs and the nursing notes. Patient X-Ray negative for obvious fracture or dislocation.  Pt advised to follow up with orthopedics. Conservative therapy recommended and discussed. Patient will be discharged home & is agreeable with above plan. Returns precautions discussed. Pt appears safe for discharge. Knee immobilizer applied here in the ED and she will use crutches that she has at home. Pain managed in the ED and will d/c home with pain medication. No focal neuro deficits.    Final Clinical Impressions(s) / ED Diagnoses   Final diagnoses:  Acute pain of right knee    New Prescriptions Discharge Medication List as of 12/15/2016 11:36 PM    I personally performed the services described in this documentation, which was scribed in my presence. The recorded information has been reviewed and is accurate.     Kerrie Buffaloeese, Isaac Dubie BronaughM, [place]NP 12/16/16 0149    Derwood KaplanNanavati, Ankit, MD 12/17/16 [id number]

## 2016-12-15 NOTE — ED Triage Notes (Signed)
Pt in reporting R knee pain. Pt was walking and states knee "gave out" Mild swelling noted. Pt has hx of surgery on same knee. Pt denies falling, denies hitting head. Pt was ambulatory upon arrival.

## 2016-12-23 ENCOUNTER — Encounter (HOSPITAL_COMMUNITY): Payer: Self-pay | Admitting: Emergency Medicine

## 2016-12-23 ENCOUNTER — Emergency Department (HOSPITAL_COMMUNITY): Payer: Medicaid Other

## 2016-12-23 ENCOUNTER — Emergency Department (HOSPITAL_COMMUNITY)
Admission: EM | Admit: 2016-12-23 | Discharge: 2016-12-23 | Discharge status: Against Medical Advice | Payer: Medicaid Other | Attending: Emergency Medicine | Admitting: Emergency Medicine

## 2016-12-23 DIAGNOSIS — G8929 Other chronic pain: Secondary | ICD-10-CM

## 2016-12-23 DIAGNOSIS — G40909 Epilepsy, unspecified, not intractable, without status epilepticus: Secondary | ICD-10-CM

## 2016-12-23 DIAGNOSIS — M25561 Pain in right knee: Secondary | ICD-10-CM

## 2016-12-23 DIAGNOSIS — R569 Unspecified convulsions: Secondary | ICD-10-CM | POA: Insufficient documentation

## 2016-12-23 DIAGNOSIS — Z79899 Other long term (current) drug therapy: Secondary | ICD-10-CM | POA: Insufficient documentation

## 2016-12-23 LAB — CBC WITH DIFFERENTIAL/PLATELET
BASOS ABS: 0 10*3/uL (ref 0.0–0.1)
Basophils Relative: 0 %
EOS PCT: 3 %
Eosinophils Absolute: 0.2 10*3/uL (ref 0.0–0.7)
HEMATOCRIT: 40.6 % (ref 36.0–46.0)
Hemoglobin: 13.6 g/dL (ref 12.0–15.0)
LYMPHS PCT: 14 %
Lymphs Abs: 1.1 10*3/uL (ref 0.7–4.0)
MCH: 28.5 pg (ref 26.0–34.0)
MCHC: 33.5 g/dL (ref 30.0–36.0)
MCV: 84.9 fL (ref 78.0–100.0)
Monocytes Absolute: 0.5 10*3/uL (ref 0.1–1.0)
Monocytes Relative: 6 %
NEUTROS ABS: 6.1 10*3/uL (ref 1.7–7.7)
NEUTROS PCT: 77 %
PLATELETS: 175 10*3/uL (ref 150–400)
RBC: 4.78 MIL/uL (ref 3.87–5.11)
RDW: 12.9 % (ref 11.5–15.5)
WBC: 8 10*3/uL (ref 4.0–10.5)

## 2016-12-23 LAB — COMPREHENSIVE METABOLIC PANEL
ALT: 10 U/L — ABNORMAL LOW (ref 14–54)
ANION GAP: 9 (ref 5–15)
AST: 16 U/L (ref 15–41)
Albumin: 4.8 g/dL (ref 3.5–5.0)
Alkaline Phosphatase: 66 U/L (ref 38–126)
BILIRUBIN TOTAL: 0.6 mg/dL (ref 0.3–1.2)
BUN: 16 mg/dL (ref 6–20)
CO2: 26 mmol/L (ref 22–32)
Calcium: 9.3 mg/dL (ref 8.9–10.3)
Chloride: 105 mmol/L (ref 101–111)
Creatinine, Ser: 1.14 mg/dL — ABNORMAL HIGH (ref 0.44–1.00)
Glucose, Bld: 91 mg/dL (ref 65–99)
POTASSIUM: 3.9 mmol/L (ref 3.5–5.1)
Sodium: 140 mmol/L (ref 135–145)
TOTAL PROTEIN: 7.8 g/dL (ref 6.5–8.1)

## 2016-12-23 MED ORDER — SODIUM CHLORIDE 0.9 % IV BOLUS (SEPSIS): 1000.0000 mL | Freq: Once | INTRAVENOUS | Status: DC

## 2016-12-23 MED ORDER — IBUPROFEN 800 MG PO TABS
800.0000 mg | ORAL_TABLET | Freq: Once | ORAL | Status: DC
  Filled 2016-12-23: qty 1

## 2016-12-23 NOTE — ED Provider Notes (Signed)
[facility]-EMERGENCY DEPT Provider Note   CSN: [id number] Arrival date & time: 12/23/16  1911     History   Chief Complaint Chief Complaint  Patient presents with  . Seizures    HPI Victoria Middleton is a 36 y.o. female.  The history is provided by the patient. No language interpreter was used.  Seizures     Victoria Middleton is a 36 y.o. female who presents to the Emergency Department complaining of seizure.  She presents to the ED accompanied by her fianc for a relation of seizures. She has a history of seizure disorder with last seizure about a month ago. Prior to that in January. She was talking on the phone with him when she fell. He was at her side about 4 minutes later and she seemed postictal. He witnessed generalized tonic-clonic activity that lasted 30 seconds and briefly terminated to return for 30 seconds. She had an additional postictal state. She reports pain to her right knee that has been present for the last week but worsening now. She was started on tramadol last week for her knee pain. She has been taking her Keppra as prescribed at 500 mg by mouth twice a day with no recent changes in her dosages. Past Medical History:  Diagnosis Date  . Depression   . Headache(784.0)   . Kidney stones   . Migraine   . Seizures [facility](HCC)     Patient Active Problem List   Diagnosis Date Noted  . Overdose 11/24/2016  . Elevated serum creatinine 11/24/2016  . Seizure disorder (HCC) 08/24/2016  . Intractable chronic migraine without aura and with status migrainosus 01/24/2016  . Major depressive disorder, recurrent episode, moderate (HCC) 09/13/2012    Past Surgical History:  Procedure Laterality Date  . ANKLE SURGERY     left  . ANTERIOR CERVICAL DECOMP/DISCECTOMY FUSION  2018  . APPENDECTOMY    . CHOLECYSTECTOMY    . KNEE SURGERY    . TONSILLECTOMY      OB History    Gravida Para Term Preterm AB Living   1             SAB TAB Ectopic Multiple Live Births                    Home Medications    Prior to Admission medications   Medication Sig Start Date End Date Taking? Authorizing Provider  acetaminophen (TYLENOL) 325 MG tablet Take 2 tablets (650 mg total) by mouth every 6 (six) hours as needed for mild pain or moderate pain. 12/10/16  Yes Antony MaduraHumes, Kelly, PA-C  FLUoxetine (PROZAC) 20 MG capsule Take 40 mg by mouth daily.   Yes [provider]  hydrOXYzine (ATARAX/VISTARIL) 50 MG tablet Take 50 mg by mouth every 6 (six) hours as needed for anxiety.   Yes [provider]  levETIRAcetam (KEPPRA) 500 MG tablet Take 500 mg by mouth 2 (two) times daily.   Yes [provider]  levonorgestrel (MIRENA) 20 MCG/24HR IUD 1 each by Intrauterine route once.   Yes [provider]  LORazepam (ATIVAN) 0.5 MG tablet Take 0.5 mg by mouth 2 (two) times daily.   Yes [provider]  propranolol (INDERAL) 20 MG tablet Take 20 mg by mouth 2 (two) times daily.   Yes [provider]  traZODone (DESYREL) 100 MG tablet Take 100 mg by mouth at bedtime.   Yes [provider]  lidocaine (LIDODERM) 5 % Place 1 patch onto the skin  daily. Remove & Discard patch within 12 hours or as directed by MD Patient not taking: Reported on 12/23/2016 12/10/16   Antony Madura, PA-C  traMADol (ULTRAM) 50 MG tablet Take 1 tablet (50 mg total) by mouth every 6 (six) hours as needed. Patient not taking: Reported on 12/23/2016 12/15/16   Janne Napoleon, NP    Family History Family History  Problem Relation Age of Onset  . Obesity Mother   . Diabetes Mother   . Hypertension Mother   . Depression Mother   . Suicidality Mother   . Diabetes Father   . Hypertension Father   . Heart attack Father     Social History Social History  Substance Use Topics  . Smoking status: Never Smoker  . Smokeless tobacco: Never Used  . Alcohol use Yes     Comment: Rare     Allergies   Compazine [prochlorperazine edisylate]; Aripiprazole; Benadryl  [diphenhydramine hcl]; Ketorolac tromethamine; Naprosyn [naproxen]; Penicillins; and Sulfa antibiotics   Review of Systems Review of Systems  Neurological: Positive for seizures.  All other systems reviewed and are negative.    Physical Exam Updated Vital Signs BP (!) 120/58 (BP Location: Right Arm)   Pulse 63   Temp 98 F (36.7 C) (Oral)   Resp 18   Ht 5\' 8"  (1.727 m)   Wt 134.3 kg (296 lb)   LMP 11/29/2016   SpO2 96%   BMI 45.01 kg/m   Physical Exam  Constitutional: She is oriented to person, place, and time. She appears well-developed and well-nourished.  HENT:  Head: Normocephalic and atraumatic.  Eyes: Pupils are equal, round, and reactive to light.  Neck: Neck supple.  Cardiovascular: Normal rate and regular rhythm.   No murmur heard. Pulmonary/Chest: Effort normal and breath sounds normal. No respiratory distress.  Abdominal: Soft. There is no tenderness. There is no rebound and no guarding.  Musculoskeletal:  Mild tenderness to palpation over the right anterior knee. 2+ DP pulses bilaterally.  Neurological: She is alert and oriented to person, place, and time.  No facial asymmetry. 5 out of 5 strength in all 4 extremities.  Skin: Skin is warm and dry.  Psychiatric: She has a normal mood and affect. Her behavior is normal.  Nursing note and vitals reviewed.    ED Treatments / Results  Labs (all labs ordered are listed, but only abnormal results are displayed) Labs Reviewed  COMPREHENSIVE METABOLIC PANEL - Abnormal; Notable for the following:       Result Value   Creatinine, Ser 1.14 (*)    ALT 10 (*)    All other components within normal limits  CBC WITH DIFFERENTIAL/PLATELET  URINALYSIS, ROUTINE W REFLEX MICROSCOPIC  POC URINE PREG, ED    EKG  EKG Interpretation None       Radiology No results found.  Procedures Procedures (including critical care time)  Medications Ordered in ED Medications  sodium chloride 0.9 % bolus 1,000 mL (not  administered)  ibuprofen (ADVIL,MOTRIN) tablet 800 mg (800 mg Oral Not Given 12/23/16 2125)     Initial Impression / Assessment and Plan / ED Course  I have reviewed the triage vital signs and the nursing notes.  Pertinent labs & imaging results that were available during my care of the patient were reviewed by me and considered in my medical decision making (see chart for details).     Patient here for evaluation of seizure, knee pain. She has a history of seizure disorder. She states she is  compliant with her medications. She did recently start tramadol, which may have contributed to her seizure today. She is at her neurologic baseline in the emergency department. Plan to check electrolytes, x-ray of right knee. Patient left the department prior to being able to complete evaluation.  Final Clinical Impressions(s) / ED Diagnoses   Final diagnoses:  None    New Prescriptions Discharge Medication List as of 12/23/2016 10:18 PM       Tilden Fossa, MD 12/23/16 2302

## 2016-12-23 NOTE — ED Notes (Signed)
Pt wanted to go home at this time.  Leaving AMA explained--- pt verbalized understanding and gave consent.

## 2016-12-23 NOTE — ED Notes (Signed)
Bed: [id number] Expected date:  Expected time:  Means of arrival:  Comments: 36 yo seizure w/ hx of the same

## 2016-12-23 NOTE — ED Triage Notes (Signed)
Brought in by EMS from home with c/o seizures.  Pt has had a witnessed 1-minute grand mal seizures at around 1750 today.  Was post-ictal on EMS' arrival; fully awake and alert on arrival to ED.  No s/s apparent injury or oral trauma.  Pt c/o headache which is normal after her seizures.  Pt compliant with her Keppra medication.

## 2016-12-27 ENCOUNTER — Emergency Department (HOSPITAL_COMMUNITY)
Admission: EM | Admit: 2016-12-27 | Discharge: 2016-12-27 | Disposition: A | Discharge status: Home / Self Care | Payer: Medicaid Other | Attending: Emergency Medicine | Admitting: Emergency Medicine

## 2016-12-27 ENCOUNTER — Encounter (HOSPITAL_COMMUNITY): Payer: Self-pay | Admitting: *Deleted

## 2016-12-27 DIAGNOSIS — K529 Noninfective gastroenteritis and colitis, unspecified: Secondary | ICD-10-CM

## 2016-12-27 LAB — WET PREP, GENITAL
CLUE CELLS WET PREP: NONE SEEN
SPERM: NONE SEEN
Trich, Wet Prep: NONE SEEN
Yeast Wet Prep HPF POC: NONE SEEN

## 2016-12-27 LAB — COMPREHENSIVE METABOLIC PANEL
ALBUMIN: 4.4 g/dL (ref 3.5–5.0)
ALK PHOS: 105 U/L (ref 38–126)
ALT: 39 U/L (ref 14–54)
AST: 53 U/L — ABNORMAL HIGH (ref 15–41)
Anion gap: 9 (ref 5–15)
BUN: 7 mg/dL (ref 6–20)
CALCIUM: 9.1 mg/dL (ref 8.9–10.3)
CO2: 23 mmol/L (ref 22–32)
CREATININE: 1.17 mg/dL — AB (ref 0.44–1.00)
Chloride: 106 mmol/L (ref 101–111)
GFR calc Af Amer: >60 mL/min (ref >60)
GFR calc non Af Amer: 59 mL/min — ABNORMAL LOW (ref >60)
GLUCOSE: 104 mg/dL — AB (ref 65–99)
Potassium: 4.2 mmol/L (ref 3.5–5.1)
SODIUM: 138 mmol/L (ref 135–145)
Total Bilirubin: 1.1 mg/dL (ref 0.3–1.2)
Total Protein: 7.5 g/dL (ref 6.5–8.1)

## 2016-12-27 LAB — URINALYSIS, ROUTINE W REFLEX MICROSCOPIC
Bilirubin Urine: NEGATIVE
GLUCOSE, UA: NEGATIVE mg/dL
Ketones, ur: NEGATIVE mg/dL
Nitrite: NEGATIVE
PH: 5 (ref 5.0–8.0)
PROTEIN: 30 mg/dL — AB
Specific Gravity, Urine: 1.024 (ref 1.005–1.030)

## 2016-12-27 LAB — CBC
HCT: 40.9 % (ref 36.0–46.0)
Hemoglobin: 13.8 g/dL (ref 12.0–15.0)
MCH: 28.8 pg (ref 26.0–34.0)
MCHC: 33.7 g/dL (ref 30.0–36.0)
MCV: 85.2 fL (ref 78.0–100.0)
PLATELETS: 197 10*3/uL (ref 150–400)
RBC: 4.8 MIL/uL (ref 3.87–5.11)
RDW: 12.9 % (ref 11.5–15.5)
WBC: 9.4 10*3/uL (ref 4.0–10.5)

## 2016-12-27 LAB — I-STAT BETA HCG BLOOD, ED (MC, WL, AP ONLY): I-stat hCG, quantitative: <5 m[IU]/mL (ref <5)

## 2016-12-27 LAB — I-STAT CG4 LACTIC ACID, ED: LACTIC ACID, VENOUS: 0.53 mmol/L (ref 0.5–1.9)

## 2016-12-27 LAB — LIPASE, BLOOD: Lipase: 22 U/L (ref 11–51)

## 2016-12-27 MED ORDER — SODIUM CHLORIDE 0.9 % IV BOLUS (SEPSIS)
1000.0000 mL | Freq: Once | INTRAVENOUS | Status: AC
  Administered 2016-12-27: 1000 mL via INTRAVENOUS

## 2016-12-27 MED ORDER — ONDANSETRON HCL 4 MG PO TABS: 4.0000 mg | ORAL_TABLET | Freq: Three times a day (TID) | ORAL | 0 refills | Status: DC | PRN

## 2016-12-27 MED ORDER — MAGNESIUM OXIDE 400 (241.3 MG) MG PO TABS
400.0000 mg | ORAL_TABLET | Freq: Once | ORAL | Status: AC
  Administered 2016-12-27: 400 mg via ORAL
  Filled 2016-12-27: qty 1

## 2016-12-27 MED ORDER — ACETAMINOPHEN 325 MG PO TABS
650.0000 mg | ORAL_TABLET | Freq: Once | ORAL | Status: AC
  Administered 2016-12-27: 650 mg via ORAL
  Filled 2016-12-27: qty 2

## 2016-12-27 MED ORDER — ONDANSETRON HCL 4 MG/2ML IJ SOLN
4.0000 mg | Freq: Once | INTRAMUSCULAR | Status: AC
  Administered 2016-12-27: 4 mg via INTRAVENOUS
  Filled 2016-12-27: qty 2

## 2016-12-27 NOTE — ED Triage Notes (Signed)
PT states diarrhea, vomiting, abdominal pain since Sunday and has not been able to take seizure and migraine meds since Mon.  Was seen by her pcp on Fri for regular check-up and has lost 16 lbs since then.

## 2016-12-27 NOTE — ED Notes (Signed)
Pt understood dc material. NAD noted. Script given at dc  

## 2016-12-27 NOTE — ED Notes (Signed)
IV attempted, unsuccessful.  

## 2016-12-27 NOTE — ED Notes (Signed)
Got patient into a gown on the monitor family at bedside

## 2016-12-27 NOTE — ED Provider Notes (Signed)
[facility] DEPT Provider Note   CSN: [id number] Arrival date & time: 12/27/16  1207     History   Chief Complaint Chief Complaint  Patient presents with  . Diarrhea    HPI Victoria Middleton is a 36 y.o. female.  The history is provided by the patient, the spouse and medical records. No language interpreter was used.  Diarrhea   This is a new problem. The current episode started more than 2 days ago. The problem occurs more than 10 times per day. The problem has not changed since onset.The stool consistency is described as watery. There has been no fever. Associated symptoms include abdominal pain (LLQ abdominal pain), vomiting, chills and sweats. Pertinent negatives include no arthralgias and no cough. She has tried an electrolyte solution, anti-motility drugs and increased fluid intake for the symptoms. The treatment provided no relief.    Past Medical History:  Diagnosis Date  . Depression   . Headache(784.0)   . Kidney stones   . Migraine   . Seizures [facility])     Patient Active Problem List   Diagnosis Date Noted  . Overdose 11/24/2016  . Elevated serum creatinine 11/24/2016  . Seizure disorder (HCC) 08/24/2016  . Intractable chronic migraine without aura and with status migrainosus 01/24/2016  . Major depressive disorder, recurrent episode, moderate (HCC) 09/13/2012    Past Surgical History:  Procedure Laterality Date  . ANKLE SURGERY     left  . ANTERIOR CERVICAL DECOMP/DISCECTOMY FUSION  2018  . APPENDECTOMY    . CHOLECYSTECTOMY    . KNEE SURGERY    . TONSILLECTOMY      OB History    Gravida Para Term Preterm AB Living   1             SAB TAB Ectopic Multiple Live Births                   Home Medications    Prior to Admission medications   Medication Sig Start Date End Date Taking? Authorizing Provider  acetaminophen (TYLENOL) 325 MG tablet Take 2 tablets (650 mg total) by mouth every 6 (six) hours as needed for mild pain or moderate pain.  12/10/16   Antony Madura, PA-C  FLUoxetine (PROZAC) 20 MG capsule Take 40 mg by mouth daily.    [provider]  hydrOXYzine (ATARAX/VISTARIL) 50 MG tablet Take 50 mg by mouth every 6 (six) hours as needed for anxiety.    [provider]  levETIRAcetam (KEPPRA) 500 MG tablet Take 500 mg by mouth 2 (two) times daily.    [provider]  levonorgestrel (MIRENA) 20 MCG/24HR IUD 1 each by Intrauterine route once.    [provider]  lidocaine (LIDODERM) 5 % Place 1 patch onto the skin daily. Remove & Discard patch within 12 hours or as directed by MD Patient not taking: Reported on 12/23/2016 12/10/16   Antony Madura, PA-C  LORazepam (ATIVAN) 0.5 MG tablet Take 0.5 mg by mouth 2 (two) times daily.    [provider]  ondansetron (ZOFRAN) 4 MG tablet Take 1 tablet (4 mg total) by mouth every 8 (eight) hours as needed for nausea or vomiting. 12/27/16   Eriyanna Kofoed, Homero Fellers, MD  propranolol (INDERAL) 20 MG tablet Take 20 mg by mouth 2 (two) times daily.    [provider]  traMADol (ULTRAM) 50 MG tablet Take 1 tablet (50 mg total) by mouth every 6 (six) hours as needed. Patient not taking: Reported on 12/23/2016 12/15/16  Kerrie Buffalo M, NP  traZODone (DESYREL) 100 MG tablet Take 100 mg by mouth at bedtime.    [provider]    Family History Family History  Problem Relation Age of Onset  . Obesity Mother   . Diabetes Mother   . Hypertension Mother   . Depression Mother   . Suicidality Mother   . Diabetes Father   . Hypertension Father   . Heart attack Father     Social History Social History  Substance Use Topics  . Smoking status: Never Smoker  . Smokeless tobacco: Never Used  . Alcohol use Yes     Comment: Rare     Allergies   Compazine [prochlorperazine edisylate]; Aripiprazole; Benadryl [diphenhydramine hcl]; Ketorolac tromethamine; Naprosyn [naproxen]; Penicillins; and Sulfa antibiotics   Review of Systems Review of Systems    Constitutional: Positive for chills. Negative for fever.  HENT: Negative for ear pain and sore throat.   Eyes: Negative for pain and visual disturbance.  Respiratory: Negative for cough and shortness of breath.   Cardiovascular: Negative for chest pain and palpitations.  Gastrointestinal: Positive for abdominal pain (LLQ abdominal pain), diarrhea and vomiting.  Genitourinary: Negative for dysuria and hematuria.  Musculoskeletal: Negative for arthralgias and back pain.  Skin: Negative for color change and rash.  Neurological: Negative for seizures and syncope.  All other systems reviewed and are negative.    Physical Exam Updated Vital Signs BP 112/74   Pulse 71   Temp 98.1 F (36.7 C) (Oral)   Resp 16   Ht 5\' 8"  (1.727 m)   Wt 127 kg (280 lb)   LMP 12/27/2016   SpO2 97%   BMI 42.57 kg/m   Physical Exam  Constitutional: She appears well-developed. She appears ill. No distress.  HENT:  Head: Normocephalic and atraumatic.  Eyes: Conjunctivae are normal.  Neck: Neck supple.  Cardiovascular: Normal rate and regular rhythm.   No murmur heard. Pulmonary/Chest: Effort normal and breath sounds normal. No respiratory distress.  Abdominal: Soft. She exhibits no distension. There is tenderness (LLQ TTP). There is no guarding.  Musculoskeletal: She exhibits no edema.  Neurological: She is alert. No cranial nerve deficit. Coordination normal.  5/5 motor strength and intact sensation in all extremities. Finger-to-nose intact bilaterally  Skin: Skin is warm and dry.  Nursing note and vitals reviewed.    ED Treatments / Results  Labs (all labs ordered are listed, but only abnormal results are displayed) Labs Reviewed  WET PREP, GENITAL - Abnormal; Notable for the following:       Result Value   WBC, Wet Prep HPF POC MANY (*)    All other components within normal limits  COMPREHENSIVE METABOLIC PANEL - Abnormal; Notable for the following:    Glucose, Bld 104 (*)    Creatinine,  Ser 1.17 (*)    AST 53 (*)    GFR calc non Af Amer 59 (*)    All other components within normal limits  URINALYSIS, ROUTINE W REFLEX MICROSCOPIC - Abnormal; Notable for the following:    Color, Urine AMBER (*)    Hgb urine dipstick LARGE (*)    Protein, ur 30 (*)    Leukocytes, UA TRACE (*)    Bacteria, UA RARE (*)    Squamous Epithelial / LPF 0-5 (*)    All other components within normal limits  GASTROINTESTINAL PANEL BY PCR, STOOL (REPLACES STOOL CULTURE)  LIPASE, BLOOD  CBC  I-STAT BETA HCG BLOOD, ED ([facility], [facility], [facility] ONLY)  I-STAT CG4 LACTIC ACID, ED  GC/CHLAMYDIA PROBE AMP ([place]) NOT AT [facility]    EKG  EKG Interpretation None       Radiology No results found.  Procedures Procedures (including critical care time)  Medications Ordered in ED Medications  sodium chloride 0.9 % bolus 1,000 mL (0 mLs Intravenous Stopped 12/27/16 1751)  ondansetron (ZOFRAN) injection 4 mg (4 mg Intravenous Given 12/27/16 1650)  magnesium oxide (MAG-OX) tablet 400 mg (400 mg Oral Given 12/27/16 1832)  acetaminophen (TYLENOL) tablet 650 mg (650 mg Oral Given 12/27/16 1831)     Initial Impression / Assessment and Plan / ED Course  I have reviewed the triage vital signs and the nursing notes.  Pertinent labs & imaging results that were available during my care of the patient were reviewed by me and considered in my medical decision making (see chart for details).     36 yo h/o seizures, depression, and HA who presents with husband for 3 days of N/V/D. Patient drank from cup containing well water 3 days ago. She developed profuse nonbloody, watery diarrhea occurring "every 30 minutes."  No recent travel or camping. No antibiotics use recently. Denies sick contacts. Has been taking Imodium without relief. She has been unable to tolerate PO, including her medications. She states she has lost 16 pounds over the past 5 days (weighed 297lb 5 days ago to 281lb today). She vomits every time she has  attempted to eat something. Endorses chills and sweats. Period started yesterday, last period 3 weeks ago. Endorses on set of LLQ abdominal pain over past day. Denies fevers, CP, SOB, dysuria, vaginal discharge, or rash.  AF, VSS. Lungs CTAB. Abdomen soft, benign throughout. Pelvic exam performed after persistent LLQ ab pain. Blood in vaginal vault and from cervix. Pt currently on period. No CMT or adnexal fullness/TTP.  CBC, CMP unremarkable. IVF and zofran given. Pt tolerating PO at bedside.  Suspect gastroenteritis. Encouraged continued fluids. Provided PO zofran.   Return precautions provided for worsening symptoms. Pt will f/u with PCP at first availability. Pt verbalized agreement with plan.  Final Clinical Impressions(s) / ED Diagnoses   Final diagnoses:  Gastroenteritis    New Prescriptions Discharge Medication List as of 12/27/2016  8:37 PM    START taking these medications   Details  ondansetron (ZOFRAN) 4 MG tablet Take 1 tablet (4 mg total) by mouth every 8 (eight) hours as needed for nausea or vomiting., Starting Wed 12/27/2016, Print         Ivee Poellnitz, Homero FellersFrank, MD 12/29/16 [id number]    Derwood KaplanNanavati, Ankit, MD 01/01/17 [id number]

## 2016-12-27 NOTE — ED Notes (Signed)
Care handoff to Matt RN

## 2016-12-29 LAB — GC/CHLAMYDIA PROBE AMP (~~LOC~~) NOT AT ARMC
Chlamydia: NEGATIVE
Neisseria Gonorrhea: NEGATIVE

## 2016-12-30 ENCOUNTER — Encounter (HOSPITAL_COMMUNITY): Payer: Self-pay

## 2016-12-30 ENCOUNTER — Emergency Department (HOSPITAL_COMMUNITY)
Admission: EM | Admit: 2016-12-30 | Discharge: 2016-12-30 | Disposition: A | Discharge status: Home / Self Care | Payer: Medicaid Other | Attending: Emergency Medicine | Admitting: Emergency Medicine

## 2016-12-30 ENCOUNTER — Emergency Department (HOSPITAL_COMMUNITY): Payer: Medicaid Other

## 2016-12-30 DIAGNOSIS — Z79899 Other long term (current) drug therapy: Secondary | ICD-10-CM | POA: Insufficient documentation

## 2016-12-30 DIAGNOSIS — R0781 Pleurodynia: Secondary | ICD-10-CM

## 2016-12-30 DIAGNOSIS — G40909 Epilepsy, unspecified, not intractable, without status epilepticus: Secondary | ICD-10-CM | POA: Insufficient documentation

## 2016-12-30 DIAGNOSIS — R569 Unspecified convulsions: Secondary | ICD-10-CM

## 2016-12-30 LAB — COMPREHENSIVE METABOLIC PANEL
ALK PHOS: 75 U/L (ref 38–126)
ALT: 24 U/L (ref 14–54)
ANION GAP: 8 (ref 5–15)
AST: 20 U/L (ref 15–41)
Albumin: 4.2 g/dL (ref 3.5–5.0)
BILIRUBIN TOTAL: 0.5 mg/dL (ref 0.3–1.2)
BUN: 12 mg/dL (ref 6–20)
CALCIUM: 9 mg/dL (ref 8.9–10.3)
CO2: 24 mmol/L (ref 22–32)
Chloride: 106 mmol/L (ref 101–111)
Creatinine, Ser: 1.14 mg/dL — ABNORMAL HIGH (ref 0.44–1.00)
GFR calc non Af Amer: >60 mL/min (ref >60)
Glucose, Bld: 99 mg/dL (ref 65–99)
Potassium: 3.9 mmol/L (ref 3.5–5.1)
SODIUM: 138 mmol/L (ref 135–145)
TOTAL PROTEIN: 7.1 g/dL (ref 6.5–8.1)

## 2016-12-30 LAB — CBC WITH DIFFERENTIAL/PLATELET
Basophils Absolute: 0 10*3/uL (ref 0.0–0.1)
Basophils Relative: 0 %
EOS ABS: 0.2 10*3/uL (ref 0.0–0.7)
Eosinophils Relative: 3 %
HEMATOCRIT: 38.7 % (ref 36.0–46.0)
HEMOGLOBIN: 12.8 g/dL (ref 12.0–15.0)
LYMPHS ABS: 0.9 10*3/uL (ref 0.7–4.0)
Lymphocytes Relative: 15 %
MCH: 28.2 pg (ref 26.0–34.0)
MCHC: 33.1 g/dL (ref 30.0–36.0)
MCV: 85.2 fL (ref 78.0–100.0)
MONOS PCT: 8 %
Monocytes Absolute: 0.5 10*3/uL (ref 0.1–1.0)
NEUTROS ABS: 4.5 10*3/uL (ref 1.7–7.7)
NEUTROS PCT: 74 %
Platelets: 190 10*3/uL (ref 150–400)
RBC: 4.54 MIL/uL (ref 3.87–5.11)
RDW: 12.8 % (ref 11.5–15.5)
WBC: 6.1 10*3/uL (ref 4.0–10.5)

## 2016-12-30 LAB — I-STAT BETA HCG BLOOD, ED (MC, WL, AP ONLY): I-stat hCG, quantitative: <5 m[IU]/mL (ref <5)

## 2016-12-30 MED ORDER — CYCLOBENZAPRINE HCL 5 MG PO TABS: 5.0000 mg | ORAL_TABLET | Freq: Three times a day (TID) | ORAL | 0 refills | Status: DC | PRN

## 2016-12-30 MED ORDER — MORPHINE SULFATE (PF) 4 MG/ML IV SOLN
4.0000 mg | Freq: Once | INTRAVENOUS | Status: AC
  Administered 2016-12-30: 4 mg via INTRAVENOUS
  Filled 2016-12-30: qty 1

## 2016-12-30 MED ORDER — ONDANSETRON HCL 4 MG/2ML IJ SOLN
4.0000 mg | Freq: Once | INTRAMUSCULAR | Status: AC
  Administered 2016-12-30: 4 mg via INTRAVENOUS
  Filled 2016-12-30: qty 2

## 2016-12-30 MED ORDER — LEVETIRACETAM 750 MG PO TABS
750.0000 mg | ORAL_TABLET | Freq: Two times a day (BID) | ORAL | 0 refills | Status: DC
Start: 2016-12-30 — End: 2017-07-02

## 2016-12-30 MED ORDER — SODIUM CHLORIDE 0.9 % IV BOLUS (SEPSIS)
1000.0000 mL | Freq: Once | INTRAVENOUS | Status: AC
  Administered 2016-12-30: 1000 mL via INTRAVENOUS

## 2016-12-30 MED ORDER — SODIUM CHLORIDE 0.9 % IV SOLN
1000.0000 mg | Freq: Once | INTRAVENOUS | Status: AC
  Administered 2016-12-30: 1000 mg via INTRAVENOUS
  Filled 2016-12-30: qty 10

## 2016-12-30 MED ORDER — HYDROMORPHONE HCL 1 MG/ML IJ SOLN
1.0000 mg | Freq: Once | INTRAMUSCULAR | Status: AC
  Administered 2016-12-30: 1 mg via INTRAVENOUS
  Filled 2016-12-30: qty 1

## 2016-12-30 NOTE — ED Notes (Signed)
2x RN attempted IV unsuccessfully. Will put in for IV team consult.

## 2016-12-30 NOTE — ED Notes (Signed)
EDP at bedside with ultrasound IV.
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
## 2016-12-30 NOTE — Discharge Instructions (Signed)
Take tylenol for pain.   Take flexeril for muscle spasms. Expect to be stiff and sore for several days.   Increase keppra to 750 mg twice daily.   See neurologist.   No driving until cleared by neurologist   Return to ER if you have another seizure, chest pain, abdominal pain, vomiting, fever.
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
## 2017-01-22 ENCOUNTER — Emergency Department (HOSPITAL_COMMUNITY): Admission: EM | Admit: 2017-01-22 | Discharge: 2017-01-22 | Discharge status: Against Medical Advice | Payer: MEDICAID

## 2017-01-22 NOTE — ED Notes (Signed)
Pt states that they do not want to stay due to wait times.

## 2017-02-21 ENCOUNTER — Emergency Department (HOSPITAL_COMMUNITY)
Admission: EM | Admit: 2017-02-21 | Discharge: 2017-02-21 | Disposition: A | Discharge status: Home / Self Care | Payer: Self-pay | Attending: Emergency Medicine | Admitting: Emergency Medicine

## 2017-02-21 ENCOUNTER — Emergency Department (HOSPITAL_COMMUNITY): Payer: Self-pay

## 2017-02-21 DIAGNOSIS — Y939 Activity, unspecified: Secondary | ICD-10-CM | POA: Insufficient documentation

## 2017-02-21 DIAGNOSIS — Y999 Unspecified external cause status: Secondary | ICD-10-CM | POA: Insufficient documentation

## 2017-02-21 DIAGNOSIS — Y33XXXA Other specified events, undetermined intent, initial encounter: Secondary | ICD-10-CM | POA: Insufficient documentation

## 2017-02-21 DIAGNOSIS — Y929 Unspecified place or not applicable: Secondary | ICD-10-CM | POA: Insufficient documentation

## 2017-02-21 DIAGNOSIS — S301XXA Contusion of abdominal wall, initial encounter: Secondary | ICD-10-CM | POA: Insufficient documentation

## 2017-02-21 LAB — PREGNANCY, URINE: Preg Test, Ur: NEGATIVE

## 2017-02-21 MED ORDER — ONDANSETRON HCL 4 MG/2ML IJ SOLN
4.0000 mg | Freq: Once | INTRAMUSCULAR | Status: AC
  Administered 2017-02-21: 4 mg via INTRAVENOUS
  Filled 2017-02-21: qty 2

## 2017-02-21 MED ORDER — HYDROMORPHONE HCL 1 MG/ML IJ SOLN
1.0000 mg | Freq: Once | INTRAMUSCULAR | Status: AC
  Administered 2017-02-21: 1 mg via INTRAVENOUS
  Filled 2017-02-21: qty 1

## 2017-02-21 MED ORDER — OXYCODONE-ACETAMINOPHEN 5-325 MG PO TABS: 1.0000 | ORAL_TABLET | Freq: Four times a day (QID) | ORAL | 0 refills | Status: DC | PRN

## 2017-02-21 MED ORDER — IOPAMIDOL (ISOVUE-300) INJECTION 61%
INTRAVENOUS | Status: AC
  Administered 2017-02-21: 100 mL
  Filled 2017-02-21: qty 100

## 2017-02-21 MED ORDER — CYCLOBENZAPRINE HCL 10 MG PO TABS: 10.0000 mg | ORAL_TABLET | Freq: Two times a day (BID) | ORAL | 0 refills | Status: DC | PRN

## 2017-02-21 NOTE — ED Provider Notes (Signed)
[facility] Provider Note   CSN: [id number] Arrival date & time: 02/21/17  1512     History   Chief Complaint No chief complaint on file.   HPI Danford BadMelissa Myers is a 36 y.o. female.  HPI Danford BadMelissa Myers is a 36 y.o. female presents to emergency room complaining of right lower back pain radiating to her abdomen. She reports that her symptoms began 2 days ago, but worsened since last night. She has also developed some bruising over the right flank. She denies any trauma. No heavy lifting. No falling or hitting that side on anything. She reports pain is worse with any movement. It does not radiate into her extremities. No trouble controlling bladder or bowels. The pain does radiate into her abdomen is very tender to the touch. Denies any nausea or vomiting. Has been taking Tylenol and ibuprofen at home with no relief.  No past medical history on file.  There are no active problems to display for this patient.   No past surgical history on file.  OB History    No data available       Home Medications    Prior to Admission medications   Not on File    Family History No family history on file.  Social History Social History  Substance Use Topics  . Smoking status: Not on file  . Smokeless tobacco: Not on file  . Alcohol use Not on file     Allergies   Patient has no allergy information on record.   Review of Systems Review of Systems  Constitutional: Negative for chills and fever.  Respiratory: Negative for cough, chest tightness and shortness of breath.   Cardiovascular: Negative for chest pain, palpitations and leg swelling.  Gastrointestinal: Positive for abdominal pain. Negative for diarrhea, nausea and vomiting.  Genitourinary: Positive for flank pain. Negative for dysuria, pelvic pain, vaginal bleeding, vaginal discharge and vaginal pain.  Musculoskeletal: Negative for arthralgias, myalgias, neck pain and neck stiffness.  Skin: Negative for rash.    Neurological: Negative for dizziness, weakness and headaches.  All other systems reviewed and are negative.    Physical Exam Updated Vital Signs There were no vitals taken for this visit.  Physical Exam  Constitutional: She appears well-developed and well-nourished.  Patient is tearful  HENT:  Head: Normocephalic.  Eyes: Conjunctivae are normal.  Neck: Neck supple.  Cardiovascular: Normal rate, regular rhythm and normal heart sounds.   Pulmonary/Chest: Effort normal and breath sounds normal. No respiratory distress. She has no wheezes. She has no rales.  Abdominal: Soft. Bowel sounds are normal. She exhibits no distension. There is tenderness. There is no rebound.  Right upper quadrant and epigastric tenderness.  Musculoskeletal: She exhibits no edema.  Bruising noted to the right flank. Mild swelling noted. Tender to palpation over the right lower paraspinal muscles. Pain with any range of motion of the right leg.  Neurological: She is alert.  Skin: Skin is warm and dry.  Psychiatric: She has a normal mood and affect. Her behavior is normal.  Nursing note and vitals reviewed.    ED Treatments / Results  Labs (all labs ordered are listed, but only abnormal results are displayed) Labs Reviewed  PREGNANCY, URINE  I-STAT BETA HCG BLOOD, ED ([facility], [facility], [facility] ONLY)    EKG  EKG Interpretation None       Radiology No results found.  Procedures Procedures (including critical care time)  Medications Ordered in ED Medications  HYDROmorphone (DILAUDID) injection 1 mg (not administered)  ondansetron (ZOFRAN) injection 4 mg (not administered)     Initial Impression / Assessment and Plan / ED Course  I have reviewed the triage vital signs and the nursing notes.  Pertinent labs & imaging results that were available during my care of the patient were reviewed by me and considered in my medical decision making (see chart for details).     Patient with non traumatic bruising  to the right lower back with severe pain radiating into the abdomen. Patient has significant tenderness to the abdomen. Labs obtained in triage, however did not cross over into the system due to Epic downtime. Will get CT abdomen and pelvis.  Labs: CBC normal WBC 7.0, hgb 13.4 CMP- all within normal LFTs- normal Lipase 29- normal PT and PTT normal UA normal other than trace leuk, rare bac, 0-5 epithelial cells.    Pain down to 5/10 after 1mg  of dilaudid. Pt signed out at shift change pending CT abd/pelvis. Disposition based on results.   Final Clinical Impressions(s) / ED Diagnoses   Final diagnoses:  None    New Prescriptions New Prescriptions   No medications on file     Jaynie Crumble, PA-C 02/22/17 1151    Doug Sou, MD 02/22/17 1452
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
## 2017-02-21 NOTE — Discharge Instructions (Signed)
Your CT scan is reassuring that there is no internal organ damage.  The exact cause of your hematoma.  It remains a mystery your lab work is all within normal parameters, as well as your urine.  He been given a prescription for Percocet for pain control as well as Flexeril for muscle relaxer.  He can apply heat or cold whichever feels better.  Follow-up with your primary care physician

## 2017-02-21 NOTE — ED Triage Notes (Signed)
See paper charting for triage note.

## 2017-02-22 LAB — URINALYSIS, ROUTINE W REFLEX MICROSCOPIC
BILIRUBIN URINE: NEGATIVE
GLUCOSE, UA: NEGATIVE mg/dL
Hgb urine dipstick: NEGATIVE
KETONES UR: NEGATIVE mg/dL
NITRITE: NEGATIVE
PROTEIN: NEGATIVE mg/dL
Specific Gravity, Urine: 1.013 (ref 1.005–1.030)
pH: 5 (ref 5.0–8.0)

## 2017-02-22 LAB — COMPREHENSIVE METABOLIC PANEL
ALBUMIN: 4.1 g/dL (ref 3.5–5.0)
ALK PHOS: 59 U/L (ref 38–126)
ALT: 14 U/L (ref 14–54)
AST: 20 U/L (ref 15–41)
Anion gap: 10 (ref 5–15)
BILIRUBIN TOTAL: 0.6 mg/dL (ref 0.3–1.2)
BUN: 8 mg/dL (ref 6–20)
CALCIUM: 9.1 mg/dL (ref 8.9–10.3)
CO2: 23 mmol/L (ref 22–32)
Chloride: 104 mmol/L (ref 101–111)
Creatinine, Ser: 0.98 mg/dL (ref 0.44–1.00)
GFR calc Af Amer: >60 mL/min (ref >60)
GFR calc non Af Amer: >60 mL/min (ref >60)
GLUCOSE: 97 mg/dL (ref 65–99)
Potassium: 4.5 mmol/L (ref 3.5–5.1)
Sodium: 137 mmol/L (ref 135–145)
TOTAL PROTEIN: 6.9 g/dL (ref 6.5–8.1)

## 2017-02-22 LAB — CBC
HEMATOCRIT: 40.5 % (ref 36.0–46.0)
HEMOGLOBIN: 13.4 g/dL (ref 12.0–15.0)
MCH: 28.4 pg (ref 26.0–34.0)
MCHC: 33.1 g/dL (ref 30.0–36.0)
MCV: 85.8 fL (ref 78.0–100.0)
Platelets: 204 10*3/uL (ref 150–400)
RBC: 4.72 MIL/uL (ref 3.87–5.11)
RDW: 13.1 % (ref 11.5–15.5)
WBC: 7 10*3/uL (ref 4.0–10.5)

## 2017-02-22 LAB — I-STAT CHEM 8, ED
BUN: 10 mg/dL (ref 6–20)
CREATININE: 1 mg/dL (ref 0.44–1.00)
Calcium, Ion: 1.1 mmol/L — ABNORMAL LOW (ref 1.15–1.40)
Chloride: 103 mmol/L (ref 101–111)
Glucose, Bld: 95 mg/dL (ref 65–99)
HEMATOCRIT: 40 % (ref 36.0–46.0)
HEMOGLOBIN: 13.6 g/dL (ref 12.0–15.0)
Potassium: 4.5 mmol/L (ref 3.5–5.1)
Sodium: 139 mmol/L (ref 135–145)
TCO2: 25 mmol/L (ref 0–100)

## 2017-02-22 LAB — APTT: APTT: 31 s (ref 24–36)

## 2017-02-22 LAB — PROTIME-INR
INR: 0.98
Prothrombin Time: 13 seconds (ref 11.4–15.2)

## 2017-02-22 LAB — LIPASE, BLOOD: Lipase: 29 U/L (ref 11–51)

## 2017-03-07 ENCOUNTER — Emergency Department (HOSPITAL_COMMUNITY): Payer: Self-pay

## 2017-03-07 ENCOUNTER — Encounter (HOSPITAL_COMMUNITY): Payer: Self-pay

## 2017-03-07 ENCOUNTER — Emergency Department (HOSPITAL_COMMUNITY)
Admission: EM | Admit: 2017-03-07 | Discharge: 2017-03-08 | Disposition: A | Discharge status: Home / Self Care | Payer: Self-pay | Attending: Emergency Medicine | Admitting: Emergency Medicine

## 2017-03-07 DIAGNOSIS — R569 Unspecified convulsions: Secondary | ICD-10-CM | POA: Insufficient documentation

## 2017-03-07 DIAGNOSIS — W19XXXA Unspecified fall, initial encounter: Secondary | ICD-10-CM | POA: Insufficient documentation

## 2017-03-07 DIAGNOSIS — Y939 Activity, unspecified: Secondary | ICD-10-CM | POA: Insufficient documentation

## 2017-03-07 DIAGNOSIS — S7001XA Contusion of right hip, initial encounter: Secondary | ICD-10-CM | POA: Insufficient documentation

## 2017-03-07 DIAGNOSIS — R52 Pain, unspecified: Secondary | ICD-10-CM

## 2017-03-07 DIAGNOSIS — S40011A Contusion of right shoulder, initial encounter: Secondary | ICD-10-CM | POA: Insufficient documentation

## 2017-03-07 DIAGNOSIS — Z79899 Other long term (current) drug therapy: Secondary | ICD-10-CM | POA: Insufficient documentation

## 2017-03-07 DIAGNOSIS — Y999 Unspecified external cause status: Secondary | ICD-10-CM | POA: Insufficient documentation

## 2017-03-07 DIAGNOSIS — Y92002 Bathroom of unspecified non-institutional (private) residence single-family (private) house as the place of occurrence of the external cause: Secondary | ICD-10-CM | POA: Insufficient documentation

## 2017-03-07 DIAGNOSIS — R51 Headache: Secondary | ICD-10-CM | POA: Insufficient documentation

## 2017-03-07 DIAGNOSIS — M542 Cervicalgia: Secondary | ICD-10-CM | POA: Insufficient documentation

## 2017-03-07 DIAGNOSIS — Z880 Allergy status to penicillin: Secondary | ICD-10-CM | POA: Insufficient documentation

## 2017-03-07 LAB — CBC WITH DIFFERENTIAL/PLATELET
BASOS PCT: 0 %
Basophils Absolute: 0 10*3/uL (ref 0.0–0.1)
EOS ABS: 0.2 10*3/uL (ref 0.0–0.7)
EOS PCT: 3 %
HEMATOCRIT: 37 % (ref 36.0–46.0)
Hemoglobin: 11.9 g/dL — ABNORMAL LOW (ref 12.0–15.0)
Lymphocytes Relative: 14 %
Lymphs Abs: 1 10*3/uL (ref 0.7–4.0)
MCH: 28.1 pg (ref 26.0–34.0)
MCHC: 32.2 g/dL (ref 30.0–36.0)
MCV: 87.3 fL (ref 78.0–100.0)
MONO ABS: 0.5 10*3/uL (ref 0.1–1.0)
MONOS PCT: 7 %
NEUTROS ABS: 5.6 10*3/uL (ref 1.7–7.7)
Neutrophils Relative %: 76 %
Platelets: 189 10*3/uL (ref 150–400)
RBC: 4.24 MIL/uL (ref 3.87–5.11)
RDW: 13.8 % (ref 11.5–15.5)
WBC: 7.4 10*3/uL (ref 4.0–10.5)

## 2017-03-07 LAB — COMPREHENSIVE METABOLIC PANEL
ALBUMIN: 4 g/dL (ref 3.5–5.0)
ALT: 14 U/L (ref 14–54)
ANION GAP: 9 (ref 5–15)
AST: 18 U/L (ref 15–41)
Alkaline Phosphatase: 59 U/L (ref 38–126)
BILIRUBIN TOTAL: 0.6 mg/dL (ref 0.3–1.2)
BUN: 7 mg/dL (ref 6–20)
CO2: 24 mmol/L (ref 22–32)
Calcium: 8.9 mg/dL (ref 8.9–10.3)
Chloride: 105 mmol/L (ref 101–111)
Creatinine, Ser: 0.92 mg/dL (ref 0.44–1.00)
GFR calc Af Amer: >60 mL/min (ref >60)
GFR calc non Af Amer: >60 mL/min (ref >60)
GLUCOSE: 90 mg/dL (ref 65–99)
POTASSIUM: 4.1 mmol/L (ref 3.5–5.1)
Sodium: 138 mmol/L (ref 135–145)
TOTAL PROTEIN: 7 g/dL (ref 6.5–8.1)

## 2017-03-07 LAB — I-STAT BETA HCG BLOOD, ED (MC, WL, AP ONLY)

## 2017-03-07 LAB — CBG MONITORING, ED: Glucose-Capillary: 83 mg/dL (ref 65–99)

## 2017-03-07 MED ORDER — OXYCODONE-ACETAMINOPHEN 5-325 MG PO TABS
1.0000 | ORAL_TABLET | Freq: Once | ORAL | Status: AC
  Administered 2017-03-07: 1 via ORAL
  Filled 2017-03-07: qty 1

## 2017-03-07 NOTE — ED Notes (Signed)
Unsuccessful IV attempt x2.  

## 2017-03-07 NOTE — ED Provider Notes (Signed)
[facility]PT Provider Note   CSN: [id number] Arrival date & time: 03/07/17  2130     History   Chief Complaint Chief Complaint  Patient presents with  . Seizures  . Fall    HPI Victoria Middleton is a 36 y.o. female.  This a 36 year old female with a known seizure disorder who had an unwitnessed seizure.  Tonight, she was found by her significant other. On the floor in the bathroom with her head up against the wall.  He is now complaining of head pain, neck pain, right clavicle/shoulder pain and right hip pain. She denies any recent illnesses.  She states she's been taking her medication on a regular basis with no skipped doses.       Past Medical History:  Diagnosis Date  . Depression   . Headache(784.0)   . Kidney stones   . Migraine   . Seizures [facility])     Patient Active Problem List   Diagnosis Date Noted  . Overdose 11/24/2016  . Elevated serum creatinine 11/24/2016  . Seizure disorder (HCC) 08/24/2016  . Intractable chronic migraine without aura and with status migrainosus 01/24/2016  . Major depressive disorder, recurrent episode, moderate (HCC) 09/13/2012    Past Surgical History:  Procedure Laterality Date  . ANKLE SURGERY     left  . ANTERIOR CERVICAL DECOMP/DISCECTOMY FUSION  2018  . APPENDECTOMY    . CHOLECYSTECTOMY    . KNEE SURGERY    . TONSILLECTOMY      OB History    Gravida Para Term Preterm AB Living   1             SAB TAB Ectopic Multiple Live Births                   Home Medications    Prior to Admission medications   Medication Sig Start Date End Date Taking? Authorizing Provider  acetaminophen (TYLENOL) 325 MG tablet Take 2 tablets (650 mg total) by mouth every 6 (six) hours as needed for mild pain or moderate pain. 12/10/16  Yes Antony Madura, PA-C  aspirin-acetaminophen-caffeine (EXCEDRIN MIGRAINE) [phone] MG tablet Take 2 tablets by mouth every 6 (six) hours as needed for headache.   Yes [provider]    FLUoxetine (PROZAC) 40 MG capsule Take 40 mg by mouth daily.   Yes [provider]  folic acid (FOLVITE) 1 MG tablet Take 4 mg by mouth daily.   Yes [provider]  levETIRAcetam (KEPPRA) 750 MG tablet Take 1 tablet (750 mg total) by mouth 2 (two) times daily. 12/30/16  Yes Charlynne Pander, MD  LORazepam (ATIVAN) 0.5 MG tablet Take 0.5 mg by mouth every 8 (eight) hours as needed for anxiety.    Yes [provider]  Prenatal Vit-Fe Fumarate-FA (MULTIVITAMIN-PRENATAL) 27-0.8 MG TABS tablet Take 1 tablet by mouth daily at 12 noon.   Yes [provider]  propranolol (INDERAL) 20 MG tablet Take 20 mg by mouth 2 (two) times daily.   Yes [provider]  cyclobenzaprine (FLEXERIL) 5 MG tablet Take 1 tablet (5 mg total) by mouth 3 (three) times daily as needed for muscle spasms. Patient not taking: Reported on 03/07/2017 12/30/16   Charlynne Pander, MD  lidocaine (LIDODERM) 5 % Place 1 patch onto the skin daily. Remove & Discard patch within 12 hours or as directed by MD Patient not taking: Reported on 03/07/2017 12/10/16   Antony Madura, PA-C  ondansetron (ZOFRAN) 4 MG tablet Take  1 tablet (4 mg total) by mouth every 8 (eight) hours as needed for nausea or vomiting. Patient not taking: Reported on 03/07/2017 12/27/16   Hebert Soho, MD  oxyCODONE-acetaminophen (PERCOCET/ROXICET) 5-325 MG tablet Take 1-2 tablets by mouth every 6 (six) hours as needed for severe pain. Patient not taking: Reported on 03/07/2017 02/21/17   Earley Favor, NP  oxyCODONE-acetaminophen (PERCOCET/ROXICET) 5-325 MG tablet Take 1-2 tablets by mouth every 6 (six) hours as needed for severe pain. 03/08/17   Earley Favor, NP  traMADol (ULTRAM) 50 MG tablet Take 1 tablet (50 mg total) by mouth every 6 (six) hours as needed. Patient not taking: Reported on 12/23/2016 12/15/16   Janne Napoleon, NP    Family History Family History  Problem Relation Age of Onset  . Obesity Mother   . Diabetes Mother   .  Hypertension Mother   . Depression Mother   . Suicidality Mother   . Diabetes Father   . Hypertension Father   . Heart attack Father     Social History Social History  Substance Use Topics  . Smoking status: Never Smoker  . Smokeless tobacco: Never Used  . Alcohol use Yes     Comment: Rare     Allergies   Compazine [prochlorperazine edisylate]; Abilify [aripiprazole]; Aripiprazole; Benadryl [diphenhydramine hcl]; Compazine [prochlorperazine edisylate]; Ketorolac tromethamine; Naprosyn [naproxen]; Naproxen; Penicillins; Penicillins; Sulfa antibiotics; Sulfa antibiotics; and Toradol [ketorolac tromethamine]   Review of Systems Review of Systems  Constitutional: Negative for fever.  Eyes: Negative for visual disturbance.  Musculoskeletal: Positive for neck pain.  Skin: Negative for wound.  Neurological: Positive for seizures and headaches.  All other systems reviewed and are negative.    Physical Exam Updated Vital Signs BP 119/79   Pulse (!) 59   Temp 98.8 F (37.1 C) (Oral)   Resp 14   Ht 5\' 8"  (1.727 m)   Wt 136.1 kg (300 lb)   SpO2 99%   BMI 45.61 kg/m   Physical Exam  Constitutional: She appears well-developed and well-nourished.  HENT:  Head: Normocephalic.  Eyes: Pupils are equal, round, and reactive to light.  Neck: Spinous process tenderness and muscular tenderness present.  Cardiovascular: Normal rate.   Pulmonary/Chest: Effort normal and breath sounds normal. She exhibits tenderness.    Abdominal: Soft. Bowel sounds are normal.  Musculoskeletal: She exhibits tenderness.       Arms:      Legs: .  Positive distal pulses.  No internal or external rotation or shortening  Neurological: She is alert.  Nursing note and vitals reviewed.    ED Treatments / Results  Labs (all labs ordered are listed, but only abnormal results are displayed) Labs Reviewed  CBC WITH DIFFERENTIAL/PLATELET - Abnormal; Notable for the following:       Result Value    Hemoglobin 11.9 (*)    All other components within normal limits  COMPREHENSIVE METABOLIC PANEL  LEVETIRACETAM LEVEL  CBG MONITORING, ED  I-STAT BETA HCG BLOOD, ED ([facility], [facility], [facility] ONLY)    EKG  EKG Interpretation None       Radiology Dg Clavicle Right  Result Date: 03/07/2017 CLINICAL DATA:  Fall today during seizure.  Right clavicle pain. EXAM: RIGHT CLAVICLE - 2+ VIEWS COMPARISON:  None. FINDINGS: There is no evidence of fracture or other focal bone lesions. Soft tissues are unremarkable. IMPRESSION: Negative right clavicle radiographs. Electronically Signed   By: Rubye Oaks M.D.   On: 03/07/2017 23:49   Ct Head Wo Contrast  Result Date: 03/07/2017 CLINICAL DATA:  Fall possible seizure. EXAM: CT HEAD WITHOUT CONTRAST CT CERVICAL SPINE WITHOUT CONTRAST TECHNIQUE: Multidetector CT imaging of the head and cervical spine was performed following the standard protocol without intravenous contrast. Multiplanar CT image reconstructions of the cervical spine were also generated. COMPARISON:  CT head and cervical spine 10/19/2016 FINDINGS: CT HEAD FINDINGS Brain: No mass lesion, intraparenchymal hemorrhage or extra-axial collection. No evidence of acute cortical infarct. Brain parenchyma and CSF-containing spaces are normal for age. Vascular: No hyperdense vessel or unexpected calcification. Skull: Normal visualized skull base, calvarium and extracranial soft tissues. Sinuses/Orbits: No sinus fluid levels or advanced mucosal thickening. No mastoid effusion. Normal orbits. CT CERVICAL SPINE FINDINGS Alignment: No static subluxation. Facets are aligned. Occipital condyles are normally positioned. Skull base and vertebrae: No acute fracture.  C5-C6 ACDF. Soft tissues and spinal canal: No prevertebral fluid or swelling. No visible canal hematoma. Disc levels: No advanced spinal canal or neural foraminal stenosis. Upper chest: No pneumothorax, pulmonary nodule or pleural effusion. Other: Normal visualized  paraspinal cervical soft tissues. IMPRESSION: 1. No acute intracranial abnormality. 2. No acute fracture or static subluxation of the cervical spine. Electronically Signed   By: Deatra Robinson M.D.   On: 03/07/2017 22:41   Ct Cervical Spine Wo Contrast  Result Date: 03/07/2017 CLINICAL DATA:  Fall possible seizure. EXAM: CT HEAD WITHOUT CONTRAST CT CERVICAL SPINE WITHOUT CONTRAST TECHNIQUE: Multidetector CT imaging of the head and cervical spine was performed following the standard protocol without intravenous contrast. Multiplanar CT image reconstructions of the cervical spine were also generated. COMPARISON:  CT head and cervical spine 10/19/2016 FINDINGS: CT HEAD FINDINGS Brain: No mass lesion, intraparenchymal hemorrhage or extra-axial collection. No evidence of acute cortical infarct. Brain parenchyma and CSF-containing spaces are normal for age. Vascular: No hyperdense vessel or unexpected calcification. Skull: Normal visualized skull base, calvarium and extracranial soft tissues. Sinuses/Orbits: No sinus fluid levels or advanced mucosal thickening. No mastoid effusion. Normal orbits. CT CERVICAL SPINE FINDINGS Alignment: No static subluxation. Facets are aligned. Occipital condyles are normally positioned. Skull base and vertebrae: No acute fracture.  C5-C6 ACDF. Soft tissues and spinal canal: No prevertebral fluid or swelling. No visible canal hematoma. Disc levels: No advanced spinal canal or neural foraminal stenosis. Upper chest: No pneumothorax, pulmonary nodule or pleural effusion. Other: Normal visualized paraspinal cervical soft tissues. IMPRESSION: 1. No acute intracranial abnormality. 2. No acute fracture or static subluxation of the cervical spine. Electronically Signed   By: Deatra Robinson M.D.   On: 03/07/2017 22:41   Dg Hip Unilat With Pelvis 2-3 Views Right  Result Date: 03/07/2017 CLINICAL DATA:  Fall today during seizure, right hip pain. EXAM: DG HIP (WITH OR WITHOUT PELVIS) 2-3V RIGHT  COMPARISON:  None. FINDINGS: The cortical margins of the bony pelvis and right hip are intact. No fracture. Pubic symphysis and sacroiliac joints are congruent. Both femoral heads are well-seated in the respective acetabula. Tacks in the right abdomen from prior hernia repair. IMPRESSION: Negative radiographs of the pelvis and right hip. Electronically Signed   By: Rubye Oaks M.D.   On: 03/07/2017 23:50    Procedures Procedures (including critical care time)  Medications Ordered in ED Medications  oxyCODONE-acetaminophen (PERCOCET/ROXICET) 5-325 MG per tablet 1 tablet (1 tablet Oral Given 03/07/17 2247)  oxyCODONE-acetaminophen (PERCOCET/ROXICET) 5-325 MG per tablet 1 tablet (1 tablet Oral Given 03/08/17 0026)  ondansetron (ZOFRAN-ODT) disintegrating tablet 4 mg (4 mg Oral Given 03/08/17 0037)     Initial Impression /  Assessment and Plan / ED Course  I have reviewed the triage vital signs and the nursing notes.  Pertinent labs & imaging results that were available during my care of the patient were reviewed by me and considered in my medical decision making (see chart for details).       x-rays all negative.  Patient has been and rate in the hall.  She did require 2 Percocet tablets for pain control while in the emergency department Patient's Keppra level is pending.  She should be able to see this result tomorrow or primary care physician can check.   Final Clinical Impressions(s) / ED Diagnoses   Final diagnoses:  Seizures (HCC)  Contusion of right shoulder, initial encounter  Contusion of right hip, initial encounter    New Prescriptions New Prescriptions   OXYCODONE-ACETAMINOPHEN (PERCOCET/ROXICET) 5-325 MG TABLET    Take 1-2 tablets by mouth every 6 (six) hours as needed for severe pain.     Earley Favor, NP 03/08/17 Lenoria Chime    Mancel Bale, MD 03/09/17 [phone]

## 2017-03-07 NOTE — ED Triage Notes (Signed)
Pt via EMS after potential seizure and fall. Per EMS, pt's husband when to check on the pt when he found her on the bathroom floor. Pt was alert but confused and "sleepy" per pt's husband. Pt with R shoulder and clavicle pain and R hip pain. No crepitus or deformity noted. Per EMS, lung sounds clear, BP 134/96, HR 70, 12-lead unremarkable, 94% RA. Pt reports 10/10 pain. A&Ox4.

## 2017-03-07 NOTE — ED Notes (Signed)
ED Provider at bedside. 

## 2017-03-07 NOTE — ED Notes (Signed)
Patient transported to X-ray 

## 2017-03-08 MED ORDER — ONDANSETRON 4 MG PO TBDP
4.0000 mg | ORAL_TABLET | Freq: Once | ORAL | Status: AC
  Administered 2017-03-08: 4 mg via ORAL
  Filled 2017-03-08: qty 1

## 2017-03-08 MED ORDER — OXYCODONE-ACETAMINOPHEN 5-325 MG PO TABS
1.0000 | ORAL_TABLET | Freq: Once | ORAL | Status: AC
  Administered 2017-03-08: 1 via ORAL
  Filled 2017-03-08: qty 1

## 2017-03-08 MED ORDER — OXYCODONE-ACETAMINOPHEN 5-325 MG PO TABS: 1.0000 | ORAL_TABLET | Freq: Four times a day (QID) | ORAL | 0 refills | Status: DC | PRN

## 2017-03-08 NOTE — Discharge Instructions (Signed)
Tonight your evaluated after having a seizure.  He sustained a contusion to your right shoulder and right hip x-rays are negative.  He also had a CT scan of your head and neck, which is normal.  Keppra level is pending.  Please contact your physician tomorrow who can check your level to see if he needed an adjustment

## 2017-03-08 NOTE — ED Notes (Signed)
Pt ambulatory without difficulty. Pt with slight limp and reports mild pain but otherwise steady gait.

## 2017-03-10 LAB — LEVETIRACETAM LEVEL: LEVETIRACETAM: 38.8 ug/mL (ref 10.0–40.0)

## 2017-03-24 ENCOUNTER — Encounter (HOSPITAL_COMMUNITY): Payer: Self-pay

## 2017-03-24 ENCOUNTER — Emergency Department (HOSPITAL_COMMUNITY): Payer: Self-pay

## 2017-03-24 ENCOUNTER — Emergency Department (HOSPITAL_COMMUNITY)
Admission: EM | Admit: 2017-03-24 | Discharge: 2017-03-25 | Disposition: A | Discharge status: Home / Self Care | Payer: Self-pay | Attending: Emergency Medicine | Admitting: Emergency Medicine

## 2017-03-24 ENCOUNTER — Encounter (HOSPITAL_COMMUNITY): Payer: Self-pay | Admitting: Oncology

## 2017-03-24 ENCOUNTER — Emergency Department (HOSPITAL_COMMUNITY)
Admission: EM | Admit: 2017-03-24 | Discharge: 2017-03-24 | Disposition: A | Discharge status: Home / Self Care | Payer: Self-pay | Attending: Emergency Medicine | Admitting: Emergency Medicine

## 2017-03-24 DIAGNOSIS — Z79899 Other long term (current) drug therapy: Secondary | ICD-10-CM | POA: Insufficient documentation

## 2017-03-24 DIAGNOSIS — R569 Unspecified convulsions: Secondary | ICD-10-CM | POA: Insufficient documentation

## 2017-03-24 DIAGNOSIS — Z7982 Long term (current) use of aspirin: Secondary | ICD-10-CM | POA: Insufficient documentation

## 2017-03-24 DIAGNOSIS — M25532 Pain in left wrist: Secondary | ICD-10-CM | POA: Insufficient documentation

## 2017-03-24 DIAGNOSIS — W19XXXA Unspecified fall, initial encounter: Secondary | ICD-10-CM | POA: Insufficient documentation

## 2017-03-24 DIAGNOSIS — M79602 Pain in left arm: Secondary | ICD-10-CM | POA: Insufficient documentation

## 2017-03-24 HISTORY — DX: Malingerer (conscious simulation): Z76.5

## 2017-03-24 MED ORDER — OXYCODONE-ACETAMINOPHEN 5-325 MG PO TABS
ORAL_TABLET | ORAL | Status: AC
  Filled 2017-03-24: qty 1

## 2017-03-24 MED ORDER — ONDANSETRON 4 MG PO TBDP
4.0000 mg | ORAL_TABLET | Freq: Once | ORAL | Status: AC
  Administered 2017-03-24: 4 mg via ORAL
  Filled 2017-03-24: qty 1

## 2017-03-24 MED ORDER — ONDANSETRON HCL 4 MG PO TABS: 4.0000 mg | ORAL_TABLET | Freq: Three times a day (TID) | ORAL | 0 refills | Status: DC | PRN

## 2017-03-24 MED ORDER — OXYCODONE-ACETAMINOPHEN 5-325 MG PO TABS
1.0000 | ORAL_TABLET | ORAL | Status: DC | PRN
  Administered 2017-03-24: 1 via ORAL

## 2017-03-24 NOTE — ED Notes (Signed)
ED Provider at bedside. 

## 2017-03-24 NOTE — Progress Notes (Signed)
Orthopedic Tech Progress Note Patient Details:  Victoria SealMelissa M Myers 1980/11/18 [id number]  Ortho Devices Type of Ortho Device: Thumb velcro splint Ortho Device/Splint Interventions: Application   Saul FordyceJennifer C Quinlynn Cuthbert 03/24/2017, 3:22 PM

## 2017-03-24 NOTE — Discharge Instructions (Signed)
Wear the thumb spica splint until reevaluation by hand surgery. Alternate 600 mg of ibuprofen and [phone] mg of Tylenol every 3 hours as needed for pain. Do not exceed 4000 mg of Tylenol daily. You may take Zofran as needed for nausea. Follow-up with hand surgery within 1-2 weeks. Return to the ED immediately if any concerning signs or symptoms develop such as worsening swelling, loss of pulses, numbness, tingling, or weakness.

## 2017-03-24 NOTE — ED Triage Notes (Signed)
Per Pt, Pt is coming from home with complaints of left wrist pain secondary to hearing a "pop" while playing with her children. Pt reports being unable to rotate wrist and feeling nausea from the pain.

## 2017-03-24 NOTE — ED Provider Notes (Signed)
[facility] Provider Note   CSN: [id number] Arrival date & time: 03/24/17  1313     History   Chief Complaint Chief Complaint  Patient presents with  . Wrist Pain    HPI Victoria Middleton is a 36 y.o. female who presents a with chief complaint acute onset, constant left wrist pain which occurred just prior to arrival. She states that she was "playing around" with her family on the floor when she turned to pull herself up while her husband was laying on top of her left hand. She states she heard a "pop "with immediate onset of left wrist pain. Pain is sharp and throbbing localized primarily to the radial side of the wrist. She endorses numbness to the third fourth and fifth digits which has resolved. She denies tingling but endorses weakness secondary to pain as well as nausea due to pain but no vomiting. No head injury or loss of consciousness. Pain worsens with movement of the wrist. She was given Percocet while in triage which she states has been helpful, as has ice. She did not try anything prior to arrival. she is right-hand dominant.   The history is provided by the patient.    Past Medical History:  Diagnosis Date  . Depression   . Headache(784.0)   . Kidney stones   . Migraine   . Seizures [facility](HCC)     Patient Active Problem List   Diagnosis Date Noted  . Overdose 11/24/2016  . Elevated serum creatinine 11/24/2016  . Seizure disorder (HCC) 08/24/2016  . Intractable chronic migraine without aura and with status migrainosus 01/24/2016  . Major depressive disorder, recurrent episode, moderate (HCC) 09/13/2012    Past Surgical History:  Procedure Laterality Date  . ANKLE SURGERY     left  . ANTERIOR CERVICAL DECOMP/DISCECTOMY FUSION  2018  . APPENDECTOMY    . CHOLECYSTECTOMY    . KNEE SURGERY    . TONSILLECTOMY      OB History    Gravida Para Term Preterm AB Living   1             SAB TAB Ectopic Multiple Live Births                   Home Medications     Prior to Admission medications   Medication Sig Start Date End Date Taking? Authorizing Provider  acetaminophen (TYLENOL) 325 MG tablet Take 2 tablets (650 mg total) by mouth every 6 (six) hours as needed for mild pain or moderate pain. 12/10/16   Antony MaduraHumes, Kelly, PA-C  aspirin-acetaminophen-caffeine (EXCEDRIN MIGRAINE) [phone] MG tablet Take 2 tablets by mouth every 6 (six) hours as needed for headache.    [provider]  cyclobenzaprine (FLEXERIL) 5 MG tablet Take 1 tablet (5 mg total) by mouth 3 (three) times daily as needed for muscle spasms. Patient not taking: Reported on 03/07/2017 12/30/16   Charlynne PanderYao, David Hsienta, MD  FLUoxetine (PROZAC) 40 MG capsule Take 40 mg by mouth daily.    [provider]  folic acid (FOLVITE) 1 MG tablet Take 4 mg by mouth daily.    [provider]  levETIRAcetam (KEPPRA) 750 MG tablet Take 1 tablet (750 mg total) by mouth 2 (two) times daily. 12/30/16   Charlynne PanderYao, David Hsienta, MD  lidocaine (LIDODERM) 5 % Place 1 patch onto the skin daily. Remove & Discard patch within 12 hours or as directed by MD Patient not taking: Reported on 03/07/2017 12/10/16   Antony MaduraHumes, Kelly, PA-C  LORazepam (ATIVAN) 0.5 MG tablet Take 0.5 mg by mouth every 8 (eight) hours as needed for anxiety.     [provider]  ondansetron (ZOFRAN) 4 MG tablet Take 1 tablet (4 mg total) by mouth every 8 (eight) hours as needed for nausea or vomiting. 03/24/17   Jamaira Sherk A, PA-C  oxyCODONE-acetaminophen (PERCOCET/ROXICET) 5-325 MG tablet Take 1-2 tablets by mouth every 6 (six) hours as needed for severe pain. Patient not taking: Reported on 03/07/2017 02/21/17   Earley Favor, NP  oxyCODONE-acetaminophen (PERCOCET/ROXICET) 5-325 MG tablet Take 1-2 tablets by mouth every 6 (six) hours as needed for severe pain. 03/08/17   Earley Favor, NP  Prenatal Vit-Fe Fumarate-FA (MULTIVITAMIN-PRENATAL) 27-0.8 MG TABS tablet Take 1 tablet by mouth daily at 12 noon.    [provider]    propranolol (INDERAL) 20 MG tablet Take 20 mg by mouth 2 (two) times daily.    [provider]  traMADol (ULTRAM) 50 MG tablet Take 1 tablet (50 mg total) by mouth every 6 (six) hours as needed. Patient not taking: Reported on 12/23/2016 12/15/16   Janne Napoleon, NP    Family History Family History  Problem Relation Age of Onset  . Obesity Mother   . Diabetes Mother   . Hypertension Mother   . Depression Mother   . Suicidality Mother   . Diabetes Father   . Hypertension Father   . Heart attack Father     Social History Social History  Substance Use Topics  . Smoking status: Never Smoker  . Smokeless tobacco: Never Used  . Alcohol use Yes     Comment: Rare     Allergies   Compazine [prochlorperazine edisylate]; Abilify [aripiprazole]; Aripiprazole; Benadryl [diphenhydramine hcl]; Compazine [prochlorperazine edisylate]; Ketorolac tromethamine; Naprosyn [naproxen]; Naproxen; Penicillins; Penicillins; Sulfa antibiotics; Sulfa antibiotics; and Toradol [ketorolac tromethamine]   Review of Systems Review of Systems  Gastrointestinal: Positive for nausea. Negative for vomiting.  Musculoskeletal: Positive for arthralgias (L wrist). Negative for back pain.  Neurological: Positive for numbness (resolved). Negative for syncope and weakness.     Physical Exam Updated Vital Signs BP 126/77 (BP Location: Right Arm)   Pulse 64   Temp 98.4 F (36.9 C) (Oral)   Resp 16   Ht  (1.727 m)   Wt 136.1 kg (300 lb)   LMP 03/24/2017 (Exact Date)   SpO2 97%   BMI 45.61 kg/m   Physical Exam  Constitutional: She appears well-developed and well-nourished. No distress.  Resting comfortably in chair  HENT:  Head: Normocephalic and atraumatic.  Eyes: Conjunctivae are normal. Right eye exhibits no discharge. Left eye exhibits no discharge.  Neck: No JVD present. No tracheal deviation present.  Cardiovascular: Normal rate and intact distal pulses.   2+ radial pulses bilaterally  with good capillary refill  Pulmonary/Chest: Effort normal.  Abdominal: She exhibits no distension.  Musculoskeletal: She exhibits no edema.       Left wrist: She exhibits decreased range of motion, tenderness and bony tenderness. She exhibits no swelling, no effusion, no crepitus, no deformity and no laceration.  Point tenderness at the left anatomic snuffbox. There is ecchymosis overlying this area as well as the thenar eminence. 5/5 strength of the wrist with flexion and extension against resistance. 5/5 strength of the digits with flexion and extension against resistance. Good grip strength.  Neurological: She is alert.  Fluent speech, no facial droop, sensation intact to soft touch of bilateral upper extremities  Skin: Skin is warm and  dry. Capillary refill takes less than 2 seconds. No erythema.  Psychiatric: She has a normal mood and affect. Her behavior is normal.  Nursing note and vitals reviewed.    ED Treatments / Results  Labs (all labs ordered are listed, but only abnormal results are displayed) Labs Reviewed - No data to display  EKG  EKG Interpretation None       Radiology Dg Wrist Complete Left  Result Date: 03/24/2017 CLINICAL DATA:  Fall.  Wrist injury with pain EXAM: LEFT WRIST - COMPLETE 3+ VIEW COMPARISON:  12/09/2016 FINDINGS: There is no evidence of fracture or dislocation. There is no evidence of arthropathy or other focal bone abnormality. Soft tissues are unremarkable. IMPRESSION: Negative. Electronically Signed   By: Marlan Palau M.D.   On: 03/24/2017 14:02    Procedures Procedures (including critical care time)  Medications Ordered in ED Medications  oxyCODONE-acetaminophen (PERCOCET/ROXICET) 5-325 MG per tablet 1 tablet (1 tablet Oral Given 03/24/17 1324)  oxyCODONE-acetaminophen (PERCOCET/ROXICET) 5-325 MG per tablet (not administered)  ondansetron (ZOFRAN-ODT) disintegrating tablet 4 mg (4 mg Oral Given 03/24/17 1519)     Initial Impression /  Assessment and Plan / ED Course  I have reviewed the triage vital signs and the nursing notes.  Pertinent labs & imaging results that were available during my care of the patient were reviewed by me and considered in my medical decision making (see chart for details).     Patient with acute pain of the left wrist secondary to injury earlier today. She is neurovascularly intact with good grip strength. X-rays are negative for fracture or dislocation, however she has point tenderness in the anatomic snuff box so clinically must treat like a scaphoid fracture. Patient placed in a thumb spica splint, given Zofran for nausea with improvement while in the ED. Discussed indications for return to the ED. She will follow-up with hand surgery for reevaluation in one week. Patient and patient's family verbalized understanding of and agreement with plan and patient is stable for discharge home at this time.   Final Clinical Impressions(s) / ED Diagnoses   Final diagnoses:  Acute pain of left wrist    New Prescriptions New Prescriptions   ONDANSETRON (ZOFRAN) 4 MG TABLET    Take 1 tablet (4 mg total) by mouth every 8 (eight) hours as needed for nausea or vomiting.     Jeanie Sewer, PA-C 03/24/17 1546    Tilden Fossa, MD 03/27/17 1326

## 2017-03-24 NOTE — ED Triage Notes (Signed)
Pt bib [facility] d/t tonic clonic seizure.  Per EMS pt and significant other were driving down the road when seizure started.  Stated seizure lasted approximately 40 seconds.  Pt stated to EMS she does not usually have tonic clonic seizure activity.  Per EMS pt was post ictal upon their arrival.  Pt given 4 mg ODT zofran.  Pt ambulatory to bathroom here in ED.  A&O x 4.

## 2017-03-25 ENCOUNTER — Encounter (HOSPITAL_COMMUNITY): Payer: Self-pay | Admitting: Emergency Medicine

## 2017-03-25 ENCOUNTER — Emergency Department (HOSPITAL_COMMUNITY): Payer: Self-pay

## 2017-03-25 LAB — CBG MONITORING, ED: Glucose-Capillary: 108 mg/dL — ABNORMAL HIGH (ref 65–99)

## 2017-03-25 LAB — I-STAT BETA HCG BLOOD, ED (MC, WL, AP ONLY): I-stat hCG, quantitative: <5 m[IU]/mL (ref <5)

## 2017-03-25 LAB — I-STAT CHEM 8, ED
BUN: 20 mg/dL (ref 6–20)
CALCIUM ION: 1.13 mmol/L — AB (ref 1.15–1.40)
CHLORIDE: 103 mmol/L (ref 101–111)
Creatinine, Ser: 1 mg/dL (ref 0.44–1.00)
GLUCOSE: 107 mg/dL — AB (ref 65–99)
HCT: 36 % (ref 36.0–46.0)
Hemoglobin: 12.2 g/dL (ref 12.0–15.0)
POTASSIUM: 4.3 mmol/L (ref 3.5–5.1)
Sodium: 141 mmol/L (ref 135–145)
TCO2: 26 mmol/L (ref 22–32)

## 2017-03-25 MED ORDER — IBUPROFEN 800 MG PO TABS
800.0000 mg | ORAL_TABLET | Freq: Once | ORAL | Status: DC
  Filled 2017-03-25: qty 1

## 2017-03-25 MED ORDER — HYDROCODONE-ACETAMINOPHEN 5-325 MG PO TABS
2.0000 | ORAL_TABLET | Freq: Once | ORAL | Status: AC
  Administered 2017-03-25: 2 via ORAL
  Filled 2017-03-25: qty 2

## 2017-03-25 NOTE — ED Notes (Signed)
iStats not crossing over. Results handed to PA.

## 2017-03-25 NOTE — ED Notes (Signed)
Pt c/o back pain before d/c. Pt states Vicodin helped a little. EDP aware.

## 2017-03-25 NOTE — ED Notes (Signed)
Pt ambulatory to restroom

## 2017-03-25 NOTE — ED Provider Notes (Signed)
[facility] DEPT Provider Note   CSN: [id number] Arrival date & time: 03/24/17  2310     History   Chief Complaint Chief Complaint  Patient presents with  . Seizures    HPI Victoria Middleton is a 36 y.o. female.  Patient presents to the ED with a chief complaint of seizure.  She states that she has a hx of seizures and takes Keppra.  She reports compliance in taking her meds.  She states that she has had a URI and was in a very stressful situation with her family tonight when she had her seizure.  She states that she fell and injured her arm and back.  She denies any other associated symptoms.     The history is provided by the patient. No language interpreter was used.    Past Medical History:  Diagnosis Date  . Depression   . Drug-seeking behavior   . Headache(784.0)   . Kidney stones   . Migraine   . Seizures [facility](HCC)     Patient Active Problem List   Diagnosis Date Noted  . Overdose 11/24/2016  . Elevated serum creatinine 11/24/2016  . Seizure disorder (HCC) 08/24/2016  . Intractable chronic migraine without aura and with status migrainosus 01/24/2016  . Major depressive disorder, recurrent episode, moderate (HCC) 09/13/2012    Past Surgical History:  Procedure Laterality Date  . ANKLE SURGERY     left  . ANTERIOR CERVICAL DECOMP/DISCECTOMY FUSION  2018  . APPENDECTOMY    . CHOLECYSTECTOMY    . KNEE SURGERY    . TONSILLECTOMY      OB History    Gravida Para Term Preterm AB Living   1             SAB TAB Ectopic Multiple Live Births                   Home Medications    Prior to Admission medications   Medication Sig Start Date End Date Taking? Authorizing Provider  acetaminophen (TYLENOL) 325 MG tablet Take 2 tablets (650 mg total) by mouth every 6 (six) hours as needed for mild pain or moderate pain. 12/10/16  Yes Antony MaduraHumes, Kelly, PA-C  aspirin-acetaminophen-caffeine (EXCEDRIN MIGRAINE) [id number] MG tablet Take 2 tablets by mouth every 6 (six) hours  as needed for headache.   Yes [provider]  FLUoxetine (PROZAC) 40 MG capsule Take 40 mg by mouth daily.   Yes [provider]  folic acid (FOLVITE) 1 MG tablet Take 4 mg by mouth daily.   Yes [provider]  levETIRAcetam (KEPPRA) 750 MG tablet Take 1 tablet (750 mg total) by mouth 2 (two) times daily. 12/30/16  Yes Charlynne PanderYao, David Hsienta, MD  LORazepam (ATIVAN) 0.5 MG tablet Take 0.5 mg by mouth every 8 (eight) hours as needed for anxiety.    Yes [provider]  Prenatal Vit-Fe Fumarate-FA (MULTIVITAMIN-PRENATAL) 27-0.8 MG TABS tablet Take 1 tablet by mouth daily at 12 noon.   Yes [provider]  propranolol (INDERAL) 20 MG tablet Take 20 mg by mouth 2 (two) times daily.   Yes [provider]  ondansetron (ZOFRAN) 4 MG tablet Take 1 tablet (4 mg total) by mouth every 8 (eight) hours as needed for nausea or vomiting. Patient not taking: Reported on 03/25/2017 03/24/17   Michela PitcherFawze, Mina A, PA-C  oxyCODONE-acetaminophen (PERCOCET/ROXICET) 5-325 MG tablet Take 1-2 tablets by mouth every 6 (six) hours as needed for severe pain. 03/08/17   Earley FavorSchulz, Gail, NP  traMADol (ULTRAM) 50 MG tablet Take 1 tablet (50 mg total) by mouth every 6 (six) hours as needed. Patient not taking: Reported on 12/23/2016 12/15/16   Janne Napoleon, NP    Family History Family History  Problem Relation Age of Onset  . Obesity Mother   . Diabetes Mother   . Hypertension Mother   . Depression Mother   . Suicidality Mother   . Diabetes Father   . Hypertension Father   . Heart attack Father     Social History Social History  Substance Use Topics  . Smoking status: Never Smoker  . Smokeless tobacco: Never Used  . Alcohol use Yes     Comment: Rare     Allergies   Compazine [prochlorperazine edisylate]; Aripiprazole; Benadryl [diphenhydramine hcl]; Compazine [prochlorperazine edisylate]; Ketorolac tromethamine; Naprosyn [naproxen]; Penicillins; Sulfa antibiotics; and Toradol  [ketorolac tromethamine]   Review of Systems Review of Systems  All other systems reviewed and are negative.    Physical Exam Updated Vital Signs BP 114/79   Pulse 72   Temp 98 F (36.7 C) (Oral)   Resp 16   LMP 03/24/2017 (Exact Date)   SpO2 98%   Physical Exam  Constitutional: She is oriented to person, place, and time. She appears well-developed and well-nourished.  HENT:  Head: Normocephalic and atraumatic.  No oral or dental trauma  Eyes: Pupils are equal, round, and reactive to light. Conjunctivae and EOM are normal.  Neck: Normal range of motion. Neck supple.  Cardiovascular: Normal rate and regular rhythm.  Exam reveals no gallop and no friction rub.   No murmur heard. Pulmonary/Chest: Effort normal and breath sounds normal. No respiratory distress. She has no wheezes. She has no rales. She exhibits no tenderness.  Abdominal: Soft. Bowel sounds are normal. She exhibits no distension and no mass. There is no tenderness. There is no rebound and no guarding.  Musculoskeletal: Normal range of motion. She exhibits no edema or tenderness.  ROM and strength 5/5 in all extremities, no bony abnormality or deformity  Neurological: She is alert and oriented to person, place, and time.  Skin: Skin is warm and dry.  No lacerations   Psychiatric: She has a normal mood and affect. Her behavior is normal. Judgment and thought content normal.  Nursing note and vitals reviewed.    ED Treatments / Results  Labs (all labs ordered are listed, but only abnormal results are displayed) Labs Reviewed  CBG MONITORING, ED - Abnormal; Notable for the following:       Result Value   Glucose-Capillary 108 (*)    All other components within normal limits  I-STAT CHEM 8, ED - Abnormal; Notable for the following:    Glucose, Bld 107 (*)    Calcium, Ion 1.13 (*)    All other components within normal limits  I-STAT BETA HCG BLOOD, ED ([facility], [facility], [facility] ONLY)    EKG  EKG Interpretation None         Radiology Dg Lumbar Spine Complete  Result Date: 03/25/2017 CLINICAL DATA:  Seizure.  Fall. EXAM: LUMBAR SPINE - COMPLETE 4+ VIEW COMPARISON:  CT abdomen and pelvis Dec 09, 2016 FINDINGS: Five non rib-bearing lumbar-type vertebral bodies are intact and aligned with straightened lumbar lordosis. Stable mild L3-4 disc height loss and endplate spurring. No destructive bony lesions. Sacroiliac joints are symmetric. Included prevertebral and paraspinal soft tissue planes are non-suspicious. RIGHT pelvic herniorrhaphy. Surgical clips in the included right abdomen compatible with cholecystectomy. IMPRESSION: No acute fracture deformity  or malalignment. Stable mild L3-4 degenerative disc. Electronically Signed   By: Awilda Metro M.D.   On: 03/25/2017 01:54   Dg Wrist Complete Left  Result Date: 03/25/2017 CLINICAL DATA:  Seizure.  Heard wrist pop. EXAM: LEFT HAND - COMPLETE 3+ VIEW; LEFT WRIST - COMPLETE 3+ VIEW COMPARISON:  LEFT hand radiograph Dec 09, 2016 FINDINGS: LEFT hand: There is no evidence of fracture or dislocation. There is no evidence of arthropathy or other focal bone abnormality. Soft tissues are nonsuspicious. LEFT wrist: No acute fracture deformity. No dislocation. Old fourth distal phalanx tuft fracture. There is no evidence of arthropathy or other focal bone abnormality. Soft tissues are nonsuspicious. IMPRESSION: No acute fracture deformity or dislocation. Electronically Signed   By: Awilda Metro M.D.   On: 03/25/2017 01:57   Dg Wrist Complete Left  Result Date: 03/24/2017 CLINICAL DATA:  Fall.  Wrist injury with pain EXAM: LEFT WRIST - COMPLETE 3+ VIEW COMPARISON:  12/09/2016 FINDINGS: There is no evidence of fracture or dislocation. There is no evidence of arthropathy or other focal bone abnormality. Soft tissues are unremarkable. IMPRESSION: Negative. Electronically Signed   By: Marlan Palau M.D.   On: 03/24/2017 14:02   Dg Shoulder Left  Result Date:  03/25/2017 CLINICAL DATA:  Seizure.  Wrist popped. EXAM: LEFT SHOULDER - 2+ VIEW COMPARISON:  None. FINDINGS: The humeral head is well-formed and located. The subacromial, glenohumeral and acromioclavicular joint spaces are intact. No destructive bony lesions. ACDF. Soft tissue planes are non-suspicious. IMPRESSION: Negative. Electronically Signed   By: Awilda Metro M.D.   On: 03/25/2017 01:53   Dg Hand Complete Left  Result Date: 03/25/2017 CLINICAL DATA:  Seizure.  Heard wrist pop. EXAM: LEFT HAND - COMPLETE 3+ VIEW; LEFT WRIST - COMPLETE 3+ VIEW COMPARISON:  LEFT hand radiograph Dec 09, 2016 FINDINGS: LEFT hand: There is no evidence of fracture or dislocation. There is no evidence of arthropathy or other focal bone abnormality. Soft tissues are nonsuspicious. LEFT wrist: No acute fracture deformity. No dislocation. Old fourth distal phalanx tuft fracture. There is no evidence of arthropathy or other focal bone abnormality. Soft tissues are nonsuspicious. IMPRESSION: No acute fracture deformity or dislocation. Electronically Signed   By: Awilda Metro M.D.   On: 03/25/2017 01:57    Procedures Procedures (including critical care time)  Medications Ordered in ED Medications  HYDROcodone-acetaminophen (NORCO/VICODIN) 5-325 MG per tablet 2 tablet (2 tablets Oral Given 03/25/17 0116)     Initial Impression / Assessment and Plan / ED Course  I have reviewed the triage vital signs and the nursing notes.  Pertinent labs & imaging results that were available during my care of the patient were reviewed by me and considered in my medical decision making (see chart for details).     Patient with seizure and fall.  Seizure likely provoked by stress and recent illness.  Recommend continuing regular meds.  She is back to baseline now.  No fractures from fall.  PCP follow-up.  Final Clinical Impressions(s) / ED Diagnoses   Final diagnoses:  Seizure (HCC)  Left arm pain    New  Prescriptions New Prescriptions   No medications on file     Roxy Horseman, Cordelia Poche 03/25/17 Armandina Stammer, MD 03/25/17 2311

## 2017-03-25 NOTE — ED Notes (Signed)
Patient transported to X-ray 

## 2017-03-26 ENCOUNTER — Encounter (HOSPITAL_COMMUNITY): Payer: Self-pay | Admitting: Nurse Practitioner

## 2017-03-26 ENCOUNTER — Emergency Department (HOSPITAL_COMMUNITY)
Admission: EM | Admit: 2017-03-26 | Discharge: 2017-03-27 | Disposition: A | Discharge status: Home / Self Care | Payer: Self-pay | Attending: Emergency Medicine | Admitting: Emergency Medicine

## 2017-03-26 DIAGNOSIS — M545 Low back pain, unspecified: Secondary | ICD-10-CM

## 2017-03-26 DIAGNOSIS — M62830 Muscle spasm of back: Secondary | ICD-10-CM | POA: Insufficient documentation

## 2017-03-26 MED ORDER — OXYCODONE-ACETAMINOPHEN 5-325 MG PO TABS
1.0000 | ORAL_TABLET | Freq: Once | ORAL | Status: AC
  Administered 2017-03-27: 1 via ORAL
  Filled 2017-03-26: qty 1

## 2017-03-26 MED ORDER — METHOCARBAMOL 500 MG PO TABS
750.0000 mg | ORAL_TABLET | Freq: Once | ORAL | Status: AC
  Administered 2017-03-27: 750 mg via ORAL
  Filled 2017-03-26: qty 2

## 2017-03-26 NOTE — ED Provider Notes (Signed)
[facility]PT Provider Note   CSN: [id number] Arrival date & time: 03/26/17  1648     History   Chief Complaint Chief Complaint  Patient presents with  . Back Pain    HPI Victoria Middleton is a 36 y.o. female.  Patient presents with right lower back pain that is severe with episodes of sharp, stabbing pain. It started yesterday and became severe today. She has had similar symptoms multiple times in the past. She denies known injury causing recurrence at this time. No urinary/bowel incontinence. No radiation of the pain into the extremities. No abdominal or flank pain.   The history is provided by the patient. No language interpreter was used.  Back Pain   Pertinent negatives include no chest pain, no fever, no numbness, no abdominal pain and no weakness.    Past Medical History:  Diagnosis Date  . Depression   . Drug-seeking behavior   . Headache(784.0)   . Kidney stones   . Migraine   . Seizures [facility])     Patient Active Problem List   Diagnosis Date Noted  . Overdose 11/24/2016  . Elevated serum creatinine 11/24/2016  . Seizure disorder (HCC) 08/24/2016  . Intractable chronic migraine without aura and with status migrainosus 01/24/2016  . Major depressive disorder, recurrent episode, moderate (HCC) 09/13/2012    Past Surgical History:  Procedure Laterality Date  . ANKLE SURGERY     left  . ANTERIOR CERVICAL DECOMP/DISCECTOMY FUSION  2018  . APPENDECTOMY    . CHOLECYSTECTOMY    . KNEE SURGERY    . TONSILLECTOMY      OB History    Gravida Para Term Preterm AB Living   1             SAB TAB Ectopic Multiple Live Births                   Home Medications    Prior to Admission medications   Medication Sig Start Date End Date Taking? Authorizing Provider  acetaminophen (TYLENOL) 325 MG tablet Take 2 tablets (650 mg total) by mouth every 6 (six) hours as needed for mild pain or moderate pain. 12/10/16  Yes Antony Madura, PA-C    aspirin-acetaminophen-caffeine (EXCEDRIN MIGRAINE) [phone] MG tablet Take 2 tablets by mouth every 6 (six) hours as needed for headache.   Yes [provider]  FLUoxetine (PROZAC) 40 MG capsule Take 40 mg by mouth daily.   Yes [provider]  folic acid (FOLVITE) 1 MG tablet Take 4 mg by mouth daily.   Yes [provider]  levETIRAcetam (KEPPRA) 750 MG tablet Take 1 tablet (750 mg total) by mouth 2 (two) times daily. 12/30/16  Yes Charlynne Pander, MD  LORazepam (ATIVAN) 0.5 MG tablet Take 0.5 mg by mouth every 8 (eight) hours as needed for anxiety.    Yes [provider]  ondansetron (ZOFRAN) 4 MG tablet Take 1 tablet (4 mg total) by mouth every 8 (eight) hours as needed for nausea or vomiting. 03/24/17  Yes Fawze, Mina A, PA-C  oxyCODONE-acetaminophen (PERCOCET/ROXICET) 5-325 MG tablet Take 1-2 tablets by mouth every 6 (six) hours as needed for severe pain. 03/08/17  Yes Earley Favor, NP  Prenatal Vit-Fe Fumarate-FA (MULTIVITAMIN-PRENATAL) 27-0.8 MG TABS tablet Take 1 tablet by mouth daily at 12 noon.   Yes [provider]  propranolol (INDERAL) 20 MG tablet Take 20 mg by mouth 2 (two) times daily.   Yes [provider]  traMADol Janean Sark)  50 MG tablet Take 1 tablet (50 mg total) by mouth every 6 (six) hours as needed. 12/15/16  Yes Janne Napoleon, NP    Family History Family History  Problem Relation Age of Onset  . Obesity Mother   . Diabetes Mother   . Hypertension Mother   . Depression Mother   . Suicidality Mother   . Diabetes Father   . Hypertension Father   . Heart attack Father     Social History Social History  Substance Use Topics  . Smoking status: Never Smoker  . Smokeless tobacco: Never Used  . Alcohol use Yes     Comment: Rare     Allergies   Compazine [prochlorperazine edisylate]; Aripiprazole; Benadryl [diphenhydramine hcl]; Compazine [prochlorperazine edisylate]; Ketorolac tromethamine; Naprosyn [naproxen];  Penicillins; Sulfa antibiotics; and Toradol [ketorolac tromethamine]   Review of Systems Review of Systems  Constitutional: Negative for chills and fever.  Respiratory: Negative.  Negative for shortness of breath.   Cardiovascular: Negative.  Negative for chest pain.  Gastrointestinal: Negative.  Negative for abdominal pain and nausea.  Genitourinary: Negative for enuresis.  Musculoskeletal: Positive for back pain.  Skin: Negative.   Neurological: Negative.  Negative for weakness and numbness.     Physical Exam Updated Vital Signs BP 129/71 (BP Location: Right Arm)   Pulse 78   Temp 98.2 F (36.8 C) (Oral)   Resp 18   LMP 03/24/2017 (Exact Date)   SpO2 98%   Physical Exam  Constitutional: She is oriented to person, place, and time. She appears well-developed and well-nourished.  Neck: Normal range of motion.  Cardiovascular: Intact distal pulses.   Pulmonary/Chest: Effort normal.  Abdominal: Soft. There is no tenderness.  Musculoskeletal: Normal range of motion. She exhibits no edema.  Tender right lower back. No midline tenderness.   Neurological: She is alert and oriented to person, place, and time.  Skin: Skin is warm and dry. No erythema.     ED Treatments / Results  Labs (all labs ordered are listed, but only abnormal results are displayed) Labs Reviewed - No data to display  EKG  EKG Interpretation None       Radiology Dg Lumbar Spine Complete  Result Date: 03/25/2017 CLINICAL DATA:  Seizure.  Fall. EXAM: LUMBAR SPINE - COMPLETE 4+ VIEW COMPARISON:  CT abdomen and pelvis Dec 09, 2016 FINDINGS: Five non rib-bearing lumbar-type vertebral bodies are intact and aligned with straightened lumbar lordosis. Stable mild L3-4 disc height loss and endplate spurring. No destructive bony lesions. Sacroiliac joints are symmetric. Included prevertebral and paraspinal soft tissue planes are non-suspicious. RIGHT pelvic herniorrhaphy. Surgical clips in the included right  abdomen compatible with cholecystectomy. IMPRESSION: No acute fracture deformity or malalignment. Stable mild L3-4 degenerative disc. Electronically Signed   By: Awilda Metro M.D.   On: 03/25/2017 01:54   Dg Wrist Complete Left  Result Date: 03/25/2017 CLINICAL DATA:  Seizure.  Heard wrist pop. EXAM: LEFT HAND - COMPLETE 3+ VIEW; LEFT WRIST - COMPLETE 3+ VIEW COMPARISON:  LEFT hand radiograph Dec 09, 2016 FINDINGS: LEFT hand: There is no evidence of fracture or dislocation. There is no evidence of arthropathy or other focal bone abnormality. Soft tissues are nonsuspicious. LEFT wrist: No acute fracture deformity. No dislocation. Old fourth distal phalanx tuft fracture. There is no evidence of arthropathy or other focal bone abnormality. Soft tissues are nonsuspicious. IMPRESSION: No acute fracture deformity or dislocation. Electronically Signed   By: Awilda Metro M.D.   On: 03/25/2017 01:57  Dg Shoulder Left  Result Date: 03/25/2017 CLINICAL DATA:  Seizure.  Wrist popped. EXAM: LEFT SHOULDER - 2+ VIEW COMPARISON:  None. FINDINGS: The humeral head is well-formed and located. The subacromial, glenohumeral and acromioclavicular joint spaces are intact. No destructive bony lesions. ACDF. Soft tissue planes are non-suspicious. IMPRESSION: Negative. Electronically Signed   By: Awilda Metroourtnay  Bloomer M.D.   On: 03/25/2017 01:53   Dg Hand Complete Left  Result Date: 03/25/2017 CLINICAL DATA:  Seizure.  Heard wrist pop. EXAM: LEFT HAND - COMPLETE 3+ VIEW; LEFT WRIST - COMPLETE 3+ VIEW COMPARISON:  LEFT hand radiograph Dec 09, 2016 FINDINGS: LEFT hand: There is no evidence of fracture or dislocation. There is no evidence of arthropathy or other focal bone abnormality. Soft tissues are nonsuspicious. LEFT wrist: No acute fracture deformity. No dislocation. Old fourth distal phalanx tuft fracture. There is no evidence of arthropathy or other focal bone abnormality. Soft tissues are nonsuspicious. IMPRESSION: No  acute fracture deformity or dislocation. Electronically Signed   By: Awilda Metroourtnay  Bloomer M.D.   On: 03/25/2017 01:57    Procedures Procedures (including critical care time)  Medications Ordered in ED Medications  methocarbamol (ROBAXIN) tablet 750 mg (not administered)  oxyCODONE-acetaminophen (PERCOCET/ROXICET) 5-325 MG per tablet 1 tablet (not administered)     Initial Impression / Assessment and Plan / ED Course  I have reviewed the triage vital signs and the nursing notes.  Pertinent labs & imaging results that were available during my care of the patient were reviewed by me and considered in my medical decision making (see chart for details).     Patient here for evaluation of recurrent back pain following a pattern of spasm. No reported injury, however, on chart review, the patient was seen on 03/24/17 for seizure where there was a fall and scaphoid fracture of left hand. Of note, documented history of hand injury contradicts her statement tonight that she injured her hand while play fighting with boyfriend and kids.   [place] Controlled Substances Database reviewed. The patient is receiving monthly #90 0.5 mg Lorazepam, as well as previous multiple prescriptions from varying providers. She is receiving narcotics frequently as follows:  10/23/16 Ultram #20    [place], [place] 10/31/16 Tylenol #3 #15  [place], [place] 12/16/16 Ultram #15    [place] 01/23/17 Percocet #12  [place] 02/21/17  Percocet #20 [place] 03/08/17  Percocet #20  [place], [place]  Noted allergies to multiple NSAIDs  Pain managed here for acute condition with Robaxin 750 mg, Single Percocet. Will discharge home with Robaxin and encourage PCP follow up.   Final Clinical Impressions(s) / ED Diagnoses   Final diagnoses:  None   1. Recurrent low back pain 2. Muscle spasm  New Prescriptions New Prescriptions   No medications on file     Elpidio AnisUpstill, Garvis Downum, Cordelia Poche-C 03/27/17 0003    Shon BatonHorton, Courtney F,  MD 03/28/17 [phone]

## 2017-03-26 NOTE — ED Triage Notes (Addendum)
Pt presents with c/o lower back pain. The pain began last night. She denies any injuries or history of back pain. She denies fevers, chills, nausea, vomiting, bowel or bladder changes.  She has tried ice, heat, ibuprofen, tylenol with no relief. She is crying in triage, will not sit down due to the pain.

## 2017-03-27 MED ORDER — METHOCARBAMOL 500 MG PO TABS: 500.0000 mg | ORAL_TABLET | Freq: Two times a day (BID) | ORAL | 0 refills | Status: DC

## 2017-03-27 NOTE — ED Notes (Signed)
Velcro splint applied to left wrist/thumb

## 2017-03-27 NOTE — ED Notes (Signed)
Discharge instructions and prescription reviewed - voiced understanding.  Unable to sign - no WOW with signature pad available

## 2017-04-18 ENCOUNTER — Emergency Department (HOSPITAL_COMMUNITY): Payer: Self-pay

## 2017-04-18 ENCOUNTER — Emergency Department (HOSPITAL_COMMUNITY)
Admission: EM | Admit: 2017-04-18 | Discharge: 2017-04-18 | Disposition: A | Discharge status: Home / Self Care | Payer: Self-pay | Attending: Emergency Medicine | Admitting: Emergency Medicine

## 2017-04-18 ENCOUNTER — Encounter (HOSPITAL_COMMUNITY): Payer: Self-pay

## 2017-04-18 DIAGNOSIS — R109 Unspecified abdominal pain: Secondary | ICD-10-CM | POA: Insufficient documentation

## 2017-04-18 DIAGNOSIS — Z7982 Long term (current) use of aspirin: Secondary | ICD-10-CM | POA: Insufficient documentation

## 2017-04-18 DIAGNOSIS — R3915 Urgency of urination: Secondary | ICD-10-CM | POA: Insufficient documentation

## 2017-04-18 DIAGNOSIS — Z79899 Other long term (current) drug therapy: Secondary | ICD-10-CM | POA: Insufficient documentation

## 2017-04-18 LAB — COMPREHENSIVE METABOLIC PANEL
ALBUMIN: 4.1 g/dL (ref 3.5–5.0)
ALK PHOS: 65 U/L (ref 38–126)
ALT: 12 U/L — AB (ref 14–54)
AST: 18 U/L (ref 15–41)
Anion gap: 8 (ref 5–15)
BUN: 11 mg/dL (ref 6–20)
CALCIUM: 9 mg/dL (ref 8.9–10.3)
CHLORIDE: 103 mmol/L (ref 101–111)
CO2: 26 mmol/L (ref 22–32)
CREATININE: 0.98 mg/dL (ref 0.44–1.00)
GFR calc Af Amer: >60 mL/min (ref >60)
GFR calc non Af Amer: >60 mL/min (ref >60)
GLUCOSE: 106 mg/dL — AB (ref 65–99)
Potassium: 4 mmol/L (ref 3.5–5.1)
SODIUM: 137 mmol/L (ref 135–145)
Total Bilirubin: 0.8 mg/dL (ref 0.3–1.2)
Total Protein: 7.3 g/dL (ref 6.5–8.1)

## 2017-04-18 LAB — URINALYSIS, ROUTINE W REFLEX MICROSCOPIC
Bilirubin Urine: NEGATIVE
Glucose, UA: NEGATIVE mg/dL
KETONES UR: NEGATIVE mg/dL
Nitrite: NEGATIVE
PH: 5 (ref 5.0–8.0)
Protein, ur: NEGATIVE mg/dL
SPECIFIC GRAVITY, URINE: 1.017 (ref 1.005–1.030)

## 2017-04-18 LAB — CBC
HCT: 39.6 % (ref 36.0–46.0)
Hemoglobin: 12.7 g/dL (ref 12.0–15.0)
MCH: 28.3 pg (ref 26.0–34.0)
MCHC: 32.1 g/dL (ref 30.0–36.0)
MCV: 88.2 fL (ref 78.0–100.0)
Platelets: 164 10*3/uL (ref 150–400)
RBC: 4.49 MIL/uL (ref 3.87–5.11)
RDW: 13.1 % (ref 11.5–15.5)
WBC: 7 10*3/uL (ref 4.0–10.5)

## 2017-04-18 LAB — LIPASE, BLOOD: LIPASE: 26 U/L (ref 11–51)

## 2017-04-18 LAB — I-STAT BETA HCG BLOOD, ED (MC, WL, AP ONLY)

## 2017-04-18 MED ORDER — ONDANSETRON HCL 4 MG/2ML IJ SOLN
4.0000 mg | Freq: Once | INTRAMUSCULAR | Status: AC
  Administered 2017-04-18: 4 mg via INTRAVENOUS
  Filled 2017-04-18: qty 2

## 2017-04-18 MED ORDER — FENTANYL CITRATE (PF) 100 MCG/2ML IJ SOLN
50.0000 ug | Freq: Once | INTRAMUSCULAR | Status: AC
  Administered 2017-04-18: 50 ug via INTRAVENOUS
  Filled 2017-04-18: qty 2

## 2017-04-18 MED ORDER — HYDROMORPHONE HCL 1 MG/ML IJ SOLN: 1.0000 mg | Freq: Once | INTRAMUSCULAR | Status: DC

## 2017-04-18 NOTE — ED Notes (Signed)
Pt stable and ambulatory at discharge.  I explained d/c instructions to pt and husband.

## 2017-04-18 NOTE — ED Provider Notes (Signed)
[facility] DEPT Provider Note   CSN: [id number] Arrival date & time: 04/18/17  1053     History   Chief Complaint Chief Complaint  Patient presents with  . Abdominal Pain  . Emesis    HPI Victoria Middleton is a 36 y.o. female.  HPI Victoria Middleton is a 36 y.o. female Presents to emergency department complaining of left flank pain. Patient states her pain started about one week ago but got worse in the last 2 days. She reports pain isconstant, left-sided, comes and goes in severity. She reports history of kidney stones and states it feels the same. She reports urinary urgency and feels like she can't empty her bladder. She denies any vaginal discharge or bleeding. She reports nausea, no vomiting. No changes in her bowels. Denies any abdominal pain. Denies any hematuria. She has been taking Tylenol which has not helped.  PT has been seen in our ED 15 times in the last 6 months and outside EDs  About 10 times. Her last visit was just 4 days ago for ankle pain.      Past Medical History:  Diagnosis Date  . Depression   . Drug-seeking behavior   . Headache(784.0)   . Kidney stones   . Migraine   . Seizures [facility])     Patient Active Problem List   Diagnosis Date Noted  . Overdose 11/24/2016  . Elevated serum creatinine 11/24/2016  . Seizure disorder (HCC) 08/24/2016  . Intractable chronic migraine without aura and with status migrainosus 01/24/2016  . Major depressive disorder, recurrent episode, moderate (HCC) 09/13/2012    Past Surgical History:  Procedure Laterality Date  . ANKLE SURGERY     left  . ANTERIOR CERVICAL DECOMP/DISCECTOMY FUSION  2018  . APPENDECTOMY    . CHOLECYSTECTOMY    . KNEE SURGERY    . TONSILLECTOMY      OB History    Gravida Para Term Preterm AB Living   1             SAB TAB Ectopic Multiple Live Births                   Home Medications    Prior to Admission medications   Medication Sig Start Date End Date Taking? Authorizing  Provider  acetaminophen (TYLENOL) 325 MG tablet Take 2 tablets (650 mg total) by mouth every 6 (six) hours as needed for mild pain or moderate pain. 12/10/16   Antony Madura, PA-C  aspirin-acetaminophen-caffeine (EXCEDRIN MIGRAINE) [phone] MG tablet Take 2 tablets by mouth every 6 (six) hours as needed for headache.    [provider]  FLUoxetine (PROZAC) 40 MG capsule Take 40 mg by mouth daily.    [provider]  folic acid (FOLVITE) 1 MG tablet Take 4 mg by mouth daily.    [provider]  levETIRAcetam (KEPPRA) 750 MG tablet Take 1 tablet (750 mg total) by mouth 2 (two) times daily. 12/30/16   Charlynne Pander, MD  LORazepam (ATIVAN) 0.5 MG tablet Take 0.5 mg by mouth every 8 (eight) hours as needed for anxiety.     [provider]  methocarbamol (ROBAXIN) 500 MG tablet Take 1 tablet (500 mg total) by mouth 2 (two) times daily. 03/27/17   Elpidio Anis, PA-C  ondansetron (ZOFRAN) 4 MG tablet Take 1 tablet (4 mg total) by mouth every 8 (eight) hours as needed for nausea or vomiting. 03/24/17   Michela Pitcher A, PA-C  oxyCODONE-acetaminophen (PERCOCET/ROXICET) [phone]  MG tablet Take 1-2 tablets by mouth every 6 (six) hours as needed for severe pain. 03/08/17   Earley Favor, NP  Prenatal Vit-Fe Fumarate-FA (MULTIVITAMIN-PRENATAL) 27-0.8 MG TABS tablet Take 1 tablet by mouth daily at 12 noon.    [provider]  propranolol (INDERAL) 20 MG tablet Take 20 mg by mouth 2 (two) times daily.    [provider]  traMADol (ULTRAM) 50 MG tablet Take 1 tablet (50 mg total) by mouth every 6 (six) hours as needed. 12/15/16   Janne Napoleon, NP    Family History Family History  Problem Relation Age of Onset  . Obesity Mother   . Diabetes Mother   . Hypertension Mother   . Depression Mother   . Suicidality Mother   . Diabetes Father   . Hypertension Father   . Heart attack Father     Social History Social History  Substance Use Topics  . Smoking status:  Never Smoker  . Smokeless tobacco: Never Used  . Alcohol use Yes     Comment: Rare     Allergies   Compazine [prochlorperazine edisylate]; Aripiprazole; Benadryl [diphenhydramine hcl]; Compazine [prochlorperazine edisylate]; Ketorolac tromethamine; Naprosyn [naproxen]; Penicillins; Sulfa antibiotics; and Toradol [ketorolac tromethamine]   Review of Systems Review of Systems  Constitutional: Negative for chills and fever.  Respiratory: Negative for cough, chest tightness and shortness of breath.   Cardiovascular: Negative for chest pain, palpitations and leg swelling.  Gastrointestinal: Positive for nausea. Negative for abdominal pain, diarrhea and vomiting.  Genitourinary: Positive for difficulty urinating, flank pain and urgency. Negative for dysuria, pelvic pain, vaginal bleeding, vaginal discharge and vaginal pain.  Musculoskeletal: Negative for arthralgias, myalgias, neck pain and neck stiffness.  Skin: Negative for rash.  Neurological: Negative for dizziness, weakness and headaches.  All other systems reviewed and are negative.    Physical Exam Updated Vital Signs BP (!) 144/89   Pulse 90   Temp 99.2 F (37.3 C) (Oral)   Resp 18   LMP 03/24/2017 (Exact Date)   SpO2 99%   Physical Exam  Constitutional: She is oriented to person, place, and time. She appears well-developed and well-nourished.  Tearful, rocking back and forth in pain  HENT:  Head: Normocephalic.  Eyes: Conjunctivae are normal.  Neck: Neck supple.  Cardiovascular: Normal rate, regular rhythm and normal heart sounds.   Pulmonary/Chest: Effort normal and breath sounds normal. No respiratory distress. She has no wheezes. She has no rales.  Abdominal: Soft. Bowel sounds are normal. She exhibits no distension. There is no tenderness. There is no rebound.  Left cva tenderness  Musculoskeletal: She exhibits no edema.  Neurological: She is alert and oriented to person, place, and time.  Skin: Skin is warm and  dry.  Psychiatric: She has a normal mood and affect. Her behavior is normal.  Nursing note and vitals reviewed.    ED Treatments / Results  Labs (all labs ordered are listed, but only abnormal results are displayed) Labs Reviewed  COMPREHENSIVE METABOLIC PANEL - Abnormal; Notable for the following:       Result Value   Glucose, Bld 106 (*)    ALT 12 (*)    All other components within normal limits  URINALYSIS, ROUTINE W REFLEX MICROSCOPIC - Abnormal; Notable for the following:    Hgb urine dipstick MODERATE (*)    Leukocytes, UA TRACE (*)    Bacteria, UA RARE (*)    Squamous Epithelial / LPF 0-5 (*)    All other components  within normal limits  LIPASE, BLOOD  CBC  I-STAT BETA HCG BLOOD, ED ([facility], [facility], [facility] ONLY)    EKG  EKG Interpretation None       Radiology Ct Renal Stone Study  Result Date: 04/18/2017 CLINICAL DATA:  Initial evaluation for acute left flank pain. EXAM: CT ABDOMEN AND PELVIS WITHOUT CONTRAST TECHNIQUE: Multidetector CT imaging of the abdomen and pelvis was performed following the standard protocol without IV contrast. COMPARISON:  Prior CT from 12/09/2016. FINDINGS: Lower chest: Mild bibasilar atelectasis. Trace layering bilateral pleural effusions noted. Visualized lungs are otherwise clear. Hepatobiliary: Limited noncontrast evaluation of the liver is unremarkable. Gallbladder surgically absent. No biliary dilatation. Pancreas: Pancreas within normal limits. Spleen: Spleen size at upper limits of normal measuring 14.3 cm. Adrenals/Urinary Tract: Adrenal glands are normal. Kidneys equal in size without evidence for nephrolithiasis or hydronephrosis. No radiopaque calculi seen along the course of either renal collecting system. No hydroureter. Partially distended bladder within normal limits. No layering stones within the bladder lumen. Stomach/Bowel: Stomach within normal limits. No evidence for bowel obstruction. No acute inflammatory changes seen about the bowels.  Vascular/Lymphatic: Intra-abdominal aorta of normal caliber. No adenopathy. Reproductive: Uterus and right ovary unremarkable. 2.3 cm left ovarian cyst noted, likely a normal physiologic cyst. Other: Sequelae of prior hernia repair seen at the lower right abdomen. No free air or fluid. Musculoskeletal: No acute osseous abnormality. No worrisome lytic or blastic osseous lesions. IMPRESSION: 1. No CT evidence for nephrolithiasis or obstructive uropathy. 2. No other acute intra-abdominal or pelvic process. 3. Trace layering bilateral pleural effusions. Electronically Signed   By: Rise Mu M.D.   On: 04/18/2017 14:22    Procedures Procedures (including critical care time)  Medications Ordered in ED Medications  HYDROmorphone (DILAUDID) injection 1 mg (not administered)  ondansetron (ZOFRAN) injection 4 mg (not administered)     Initial Impression / Assessment and Plan / ED Course  I have reviewed the triage vital signs and the nursing notes.  Pertinent labs & imaging results that were available during my care of the patient were reviewed by me and considered in my medical decision making (see chart for details).     Pt in ED with left flank pain. States hx of kidney stones. I reviewed her prior images from this facility and others in care everywhere. I have also seen this patient for right flank pain just a month ago with normal work up. Will give fentanyl for pain. Zofran for nausea. Labs normal. Moderate hgb in urine. Will get CT for further evaluation to ro kidney stone. She is screaming and moaning in pain in the room.    2:52 PM Patient's husband in the hallway stating that his wife is dry heaving and in severe pain. Fentanyl did not help. I reviewed patient's CT scan, which came back unremarkable. No signs of kidney stone. I reviewed her records, patient stated that she has had frequent kidney stones, did not see any finding of kidney stones in any of CT scans. Also noted that  patient has had greater than 10 CTs of various parts of the body the last few months. Have discussed results with patient. Advised her that I would not be giving her opiate medication since no findings on her scan or labwork. We discussed treatment plan with Tylenol.Patient started screaming and crying stating that she is in severe pain and that she needs to know why she is having this pain. I explained to her that her pain could be due  to muscle spasms or musculoskeletal. Advised her to follow-up with her family doctor to which she says she did not have one. I will give her referral to [facility] and wellness. Patient still upset about not receiving pain medications. She started raising her voice, crying, screaming. I explained to her that she should follow-up with her doctor and maybe go to the pain management. We discussed the fact that patient has had numerous CT scans in the last few months and that it is bad given amount of radiation. Also discussed her visits to various different emergency departments and the number of visits in the last 6 months. Patient then began ripping her leads off and ripped her IV out. She stated that she is ready to go. She began calling me various names such as "rude ass bitch."  Pt jumped out of the bed, slammed the door behind me as I walked out. Security at bedside, will dc home.   Vitals:   04/18/17 1345 04/18/17 1400 04/18/17 1415 04/18/17 1430  BP: 126/76 106/76 (!) 127/101 (!) 137/108  Pulse: 77 65 83 97  Resp: 20 (!) Temp:      TempSrc:      SpO2: 91% 99% 95% 98%    Final Clinical Impressions(s) / ED Diagnoses   Final diagnoses:  Flank pain    New Prescriptions New Prescriptions   No medications on file     Jaynie Crumble, Cordelia Poche 04/18/17 1502    Lorre Nick, MD 04/19/17 [id number]

## 2017-04-18 NOTE — ED Triage Notes (Signed)
Patient complains of LUQ pain radiating to left flank intermittently x 1 week. Reports nausea and vomiting with same. Describes pain worse with movement

## 2017-04-18 NOTE — Discharge Instructions (Signed)
Tylenol for pain. Please find a family doctor and follow up with them. Please return to ER for emergencies as needed. Your blood work, CT scan normal.

## 2017-04-18 NOTE — ED Notes (Signed)
Pt verbally abusing staff.  Pt states " I need something for pain and you people are not doing anything".   Pt screams as she is leaving "I will never be back to this stupid place again".   Husband of pt apologized for her behavior.  Pt refused to sign discharge and pulled her IV out herself.

## 2017-04-20 ENCOUNTER — Emergency Department (HOSPITAL_COMMUNITY): Payer: Self-pay

## 2017-04-20 ENCOUNTER — Emergency Department (HOSPITAL_COMMUNITY)
Admission: EM | Admit: 2017-04-20 | Discharge: 2017-04-20 | Disposition: A | Discharge status: Home / Self Care | Payer: Self-pay | Attending: Emergency Medicine | Admitting: Emergency Medicine

## 2017-04-20 ENCOUNTER — Encounter (HOSPITAL_COMMUNITY): Payer: Self-pay | Admitting: Emergency Medicine

## 2017-04-20 DIAGNOSIS — S93492A Sprain of other ligament of left ankle, initial encounter: Secondary | ICD-10-CM | POA: Insufficient documentation

## 2017-04-20 DIAGNOSIS — Z79899 Other long term (current) drug therapy: Secondary | ICD-10-CM | POA: Insufficient documentation

## 2017-04-20 DIAGNOSIS — Y929 Unspecified place or not applicable: Secondary | ICD-10-CM | POA: Insufficient documentation

## 2017-04-20 DIAGNOSIS — Y9301 Activity, walking, marching and hiking: Secondary | ICD-10-CM | POA: Insufficient documentation

## 2017-04-20 DIAGNOSIS — W1839XA Other fall on same level, initial encounter: Secondary | ICD-10-CM | POA: Insufficient documentation

## 2017-04-20 DIAGNOSIS — Y999 Unspecified external cause status: Secondary | ICD-10-CM | POA: Insufficient documentation

## 2017-04-20 HISTORY — DX: Suicide attempt, initial encounter: T14.91XA

## 2017-04-20 HISTORY — DX: Opioid abuse, uncomplicated: F11.10

## 2017-04-20 MED ORDER — PREDNISONE 20 MG PO TABS: ORAL_TABLET | ORAL | 0 refills | Status: DC

## 2017-04-20 MED ORDER — PREDNISONE 20 MG PO TABS
60.0000 mg | ORAL_TABLET | Freq: Once | ORAL | Status: AC
  Administered 2017-04-20: 60 mg via ORAL
  Filled 2017-04-20: qty 3

## 2017-04-20 MED ORDER — ACETAMINOPHEN 500 MG PO TABS
1000.0000 mg | ORAL_TABLET | Freq: Once | ORAL | Status: AC
Start: 2017-04-20 — End: 2017-04-20
  Administered 2017-04-20: 1000 mg via ORAL
  Filled 2017-04-20: qty 2

## 2017-04-20 NOTE — ED Notes (Addendum)
Pt refused crutches, states she wants to use her pair from home. Patient left at this time with all belongings.

## 2017-04-20 NOTE — ED Provider Notes (Signed)
[facility] Provider Note   CSN: [id number] Arrival date & time: 04/20/17  2054     History   Chief Complaint Chief Complaint  Patient presents with  . Fall  . Ankle Injury    HPI Victoria Middleton is a 36 y.o. female.  36 yo F With a chief complaint of left ankle pain. 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 She states that she was walking down a hill and then woke up on the ground. She thinks that she maybe had a seizure. Denies any other injury. Pain with movement palpation walking.   The history is provided by the patient.  Fall  This is a new problem. The current episode started 3 to 5 hours ago. The problem occurs rarely. The problem has not changed since onset.Pertinent negatives include no chest pain, no headaches and no shortness of breath. Nothing aggravates the symptoms. Nothing relieves the symptoms. She has tried nothing for the symptoms. The treatment provided no relief.  Ankle Injury  Pertinent negatives include no chest pain, no headaches and no shortness of breath.    Past Medical History:  Diagnosis Date  . Depression   . Drug-seeking behavior   . Headache(784.0)   . Kidney stones   . Migraine   . Seizures (HCC)   . Suicide attempt [facility])     Patient Active Problem List   Diagnosis Date Noted  . Overdose 11/24/2016  . Elevated serum creatinine 11/24/2016  . Seizure disorder (HCC) 08/24/2016  . Intractable chronic migraine without aura and with status migrainosus 01/24/2016  . Major depressive disorder, recurrent episode, moderate (HCC) 09/13/2012    Past Surgical History:  Procedure Laterality Date  . ANKLE SURGERY     left  . ANTERIOR CERVICAL DECOMP/DISCECTOMY FUSION  2018  . APPENDECTOMY    . CHOLECYSTECTOMY    . KNEE SURGERY    . TONSILLECTOMY      OB History    Gravida Para Term Preterm AB Living   1             SAB TAB Ectopic Multiple Live Births                   Home Medications     Prior to Admission medications   Medication Sig Start Date End Date Taking? Authorizing Provider  acetaminophen (TYLENOL) 325 MG tablet Take 2 tablets (650 mg total) by mouth every 6 (six) hours as needed for mild pain or moderate pain. 12/10/16   Antony Madura, PA-C  aspirin-acetaminophen-caffeine (EXCEDRIN MIGRAINE) [phone] MG tablet Take 2 tablets by mouth every 6 (six) hours as needed for headache.    [provider]  FLUoxetine (PROZAC) 40 MG capsule Take 40 mg by mouth daily.    [provider]  folic acid (FOLVITE) 1 MG tablet Take 4 mg by mouth daily.    [provider]  levETIRAcetam (KEPPRA) 750 MG tablet Take 1 tablet (750 mg total) by mouth 2 (two) times daily. 12/30/16   Charlynne Pander, MD  LORazepam (ATIVAN) 0.5 MG tablet Take 0.5 mg by mouth every 8 (eight) hours as needed for anxiety.     [provider]  methocarbamol (ROBAXIN) 500 MG tablet Take 1 tablet (500 mg total) by mouth 2 (two) times daily. 03/27/17   Elpidio Anis, PA-C  ondansetron (ZOFRAN) 4 MG tablet Take 1 tablet (4 mg total) by mouth every 8 (eight) hours as needed for nausea or vomiting.  03/24/17   Fawze, Mina A, PA-C  oxyCODONE-acetaminophen (PERCOCET/ROXICET) 5-325 MG tablet Take 1-2 tablets by mouth every 6 (six) hours as needed for severe pain. Patient not taking: Reported on 04/18/2017 03/08/17   Earley Favor, NP  predniSONE (DELTASONE) 20 MG tablet 2 tabs po daily x 4 days 04/20/17   Melene Plan, DO  Prenatal Vit-Fe Fumarate-FA (MULTIVITAMIN-PRENATAL) 27-0.8 MG TABS tablet Take 1 tablet by mouth daily at 12 noon.    [provider]  propranolol (INDERAL) 20 MG tablet Take 20 mg by mouth 2 (two) times daily.    [provider]  traMADol (ULTRAM) 50 MG tablet Take 1 tablet (50 mg total) by mouth every 6 (six) hours as needed. Patient not taking: Reported on 04/18/2017 12/15/16   Janne Napoleon, NP    Family History Family History  Problem Relation Age of  Onset  . Obesity Mother   . Diabetes Mother   . Hypertension Mother   . Depression Mother   . Suicidality Mother   . Diabetes Father   . Hypertension Father   . Heart attack Father     Social History Social History  Substance Use Topics  . Smoking status: Never Smoker  . Smokeless tobacco: Never Used  . Alcohol use Yes     Comment: Rare     Allergies   Compazine [prochlorperazine edisylate]; Aripiprazole; Benadryl [diphenhydramine hcl]; Compazine [prochlorperazine edisylate]; Ketorolac tromethamine; Naprosyn [naproxen]; Nsaids; Penicillins; Sulfa antibiotics; and Toradol [ketorolac tromethamine]   Review of Systems Review of Systems  Constitutional: Negative for chills and fever.  HENT: Negative for congestion and rhinorrhea.   Eyes: Negative for redness and visual disturbance.  Respiratory: Negative for shortness of breath and wheezing.   Cardiovascular: Negative for chest pain and palpitations.  Gastrointestinal: Negative for nausea and vomiting.  Genitourinary: Negative for dysuria and urgency.  Musculoskeletal: Positive for arthralgias. Negative for myalgias.  Skin: Negative for pallor and wound.  Neurological: Positive for seizures. Negative for dizziness and headaches.     Physical Exam Updated Vital Signs BP 116/69 (BP Location: Left Arm)   Pulse 80   Temp 98.4 F (36.9 C) (Oral)   Resp 16   Ht  (1.727 m)   Wt 127 kg (280 lb)   LMP 03/22/2017 (Within Days)   SpO2 98%   BMI 42.57 kg/m   Physical Exam  Constitutional: She is oriented to person, place, and time. She appears well-developed and well-nourished. No distress.  HENT:  Head: Normocephalic and atraumatic.  Eyes: Pupils are equal, round, and reactive to light. EOM are normal.  Neck: Normal range of motion. Neck supple.  Cardiovascular: Normal rate and regular rhythm.  Exam reveals no gallop and no friction rub.   No murmur heard. Pulmonary/Chest: Effort normal. She has no wheezes. She has  no rales.  Abdominal: Soft. She exhibits no distension. There is no tenderness.  Musculoskeletal: She exhibits tenderness (mild to the medial aspect of the ankle). She exhibits no edema.  PMS intact distally  Neurological: She is alert and oriented to person, place, and time.  Skin: Skin is warm and dry. She is not diaphoretic.  Psychiatric: She has a normal mood and affect. Her behavior is normal.  Nursing note and vitals reviewed.    ED Treatments / Results  Labs (all labs ordered are listed, but only abnormal results are displayed) Labs Reviewed - No data to display  EKG  EKG Interpretation None       Radiology Dg Ankle  Complete Left  Result Date: 04/20/2017 CLINICAL DATA:  Left ankle pain after a fall. EXAM: LEFT ANKLE COMPLETE - 3+ VIEW COMPARISON:  04/06/2013 FINDINGS: Old screw fixation of the calcaneus. No change in hardware position or appearance since previous study. No evidence of acute fracture or dislocation of the left ankle. No focal bone lesion or bone destruction. Soft tissues are unremarkable. IMPRESSION: No acute bony abnormalities. Old screw fixation the calcaneus appears unchanged. Electronically Signed   By: Burman Nieves M.D.   On: 04/20/2017 21:28    Procedures Procedures (including critical care time)  Medications Ordered in ED Medications  acetaminophen (TYLENOL) tablet 1,000 mg (1,000 mg Oral Given 04/20/17 2137)  predniSONE (DELTASONE) tablet 60 mg (60 mg Oral Given 04/20/17 2218)     Initial Impression / Assessment and Plan / ED Course  I have reviewed the triage vital signs and the nursing notes.  Pertinent labs & imaging results that were available during my care of the patient were reviewed by me and considered in my medical decision making (see chart for details).     36 yo F with a chief complaint of left ankle pain. This is most likely a sprain. X-ray is normal. Place in an ASO crutches PCP follow-up.   10:55 PM:  I have discussed  the diagnosis/risks/treatment options with the patient and family and believe the pt to be eligible for discharge home to follow-up with PCP. We also discussed returning to the ED immediately if new or worsening sx occur. We discussed the sx which are most concerning (e.g., sudden worsening pain, fever, inability to tolerate by mouth) that necessitate immediate return. Medications administered to the patient during their visit and any new prescriptions provided to the patient are listed below.  Medications given during this visit Medications  acetaminophen (TYLENOL) tablet 1,000 mg (1,000 mg Oral Given 04/20/17 2137)  predniSONE (DELTASONE) tablet 60 mg (60 mg Oral Given 04/20/17 2218)     The patient appears reasonably screen and/or stabilized for discharge and I doubt any other medical condition or other [facility] requiring further screening, evaluation, or treatment in the ED at this time prior to discharge.   Final Clinical Impressions(s) / ED Diagnoses   Final diagnoses:  Sprain of anterior talofibular ligament of left ankle, initial encounter    New Prescriptions Discharge Medication List as of 04/20/2017 10:11 PM    START taking these medications   Details  predniSONE (DELTASONE) 20 MG tablet 2 tabs po daily x 4 days, Print         Melene Plan, DO 04/20/17 2255

## 2017-04-20 NOTE — ED Notes (Signed)
BIB EMS from a store parking lot, pt called her husband and stated she felt "weird" Pt attempted to drive to her husbands job and pulled over in the parking lot where she got out and walked into the woods, tripped and fell landing on L ankle. No deformity noted, 5/10 pAIN. Received 200 fentanyl en route

## 2017-04-20 NOTE — ED Notes (Signed)
Patient transported to X-ray 

## 2017-04-20 NOTE — ED Notes (Signed)
ED Provider at bedside. 

## 2017-04-21 ENCOUNTER — Encounter (HOSPITAL_COMMUNITY): Payer: Self-pay | Admitting: Emergency Medicine

## 2017-06-15 ENCOUNTER — Encounter (HOSPITAL_COMMUNITY): Payer: Self-pay

## 2017-06-15 ENCOUNTER — Emergency Department (HOSPITAL_COMMUNITY): Payer: Self-pay

## 2017-06-15 ENCOUNTER — Other Ambulatory Visit: Payer: Self-pay

## 2017-06-15 ENCOUNTER — Emergency Department (HOSPITAL_COMMUNITY)
Admission: EM | Admit: 2017-06-15 | Discharge: 2017-06-16 | Disposition: A | Discharge status: Home / Self Care | Payer: Self-pay | Attending: Physician Assistant | Admitting: Physician Assistant

## 2017-06-15 DIAGNOSIS — S5001XA Contusion of right elbow, initial encounter: Secondary | ICD-10-CM | POA: Insufficient documentation

## 2017-06-15 DIAGNOSIS — Y9301 Activity, walking, marching and hiking: Secondary | ICD-10-CM | POA: Insufficient documentation

## 2017-06-15 DIAGNOSIS — Y929 Unspecified place or not applicable: Secondary | ICD-10-CM | POA: Insufficient documentation

## 2017-06-15 DIAGNOSIS — W0110XA Fall on same level from slipping, tripping and stumbling with subsequent striking against unspecified object, initial encounter: Secondary | ICD-10-CM | POA: Insufficient documentation

## 2017-06-15 DIAGNOSIS — Z79899 Other long term (current) drug therapy: Secondary | ICD-10-CM | POA: Insufficient documentation

## 2017-06-15 DIAGNOSIS — Y999 Unspecified external cause status: Secondary | ICD-10-CM | POA: Insufficient documentation

## 2017-06-15 DIAGNOSIS — S59901A Unspecified injury of right elbow, initial encounter: Secondary | ICD-10-CM

## 2017-06-15 NOTE — ED Triage Notes (Signed)
Pt endorses tripping this evening by accident and landed on her right elbow causing injury. Denied dizziness. Pt has bruising to right elbow and has limited ROM to right elbow and right hand tingling. CMS intact, good pulse.

## 2017-06-16 NOTE — ED Provider Notes (Signed)
[facility] Provider Note   CSN: [id number] Arrival date & time: 06/15/17  1937     History   Chief Complaint Chief Complaint  Patient presents with  . Arm Injury    HPI Victoria SealMelissa M Middleton is a 36 y.o. female.  HPI  Patient is a 36 year old female with a history of depression, migraine, and kidney stones presenting for acute pain in the right elbow after falling and landing on her elbow today.  Patient reports that she tripped over a tach in the floor and fell.  Patient does not catch herself with her wrist.  Patient did not hit her head.  There is no loss of consciousness.  Patient reports she has some paresthesias down her lateral right arm but no loss of sensation or weakness.  Range of motion of the right elbow limited at this time due to pain.  Ecchymosis form to the right lateral elbow.  Past Medical History:  Diagnosis Date  . Depression   . Drug-seeking behavior   . Headache(784.0)   . Kidney stones   . Migraine   . Narcotic abuse (HCC)   . Seizures (HCC)   . Suicide attempt [facility](HCC)     Patient Active Problem List   Diagnosis Date Noted  . Overdose 11/24/2016  . Elevated serum creatinine 11/24/2016  . Seizure disorder (HCC) 08/24/2016  . Intractable chronic migraine without aura and with status migrainosus 01/24/2016  . Major depressive disorder, recurrent episode, moderate (HCC) 09/13/2012    Past Surgical History:  Procedure Laterality Date  . ANKLE SURGERY     left  . ANTERIOR CERVICAL DECOMP/DISCECTOMY FUSION  2018  . APPENDECTOMY    . CHOLECYSTECTOMY    . KNEE SURGERY    . TONSILLECTOMY      OB History    Gravida Para Term Preterm AB Living   1             SAB TAB Ectopic Multiple Live Births                   Home Medications    Prior to Admission medications   Medication Sig Start Date End Date Taking? Authorizing Provider  acetaminophen (TYLENOL) 325 MG tablet Take 2 tablets (650 mg total) by mouth  every 6 (six) hours as needed for mild pain or moderate pain. 12/10/16   Antony MaduraHumes, Kelly, PA-C  aspirin-acetaminophen-caffeine (EXCEDRIN MIGRAINE) [phone] [id number] MG tablet Take 2 tablets by mouth every 6 (six) hours as needed for headache.    [provider]  FLUoxetine (PROZAC) 40 MG capsule Take 40 mg by mouth daily.    [provider]  folic acid (FOLVITE) 1 MG tablet Take 4 mg by mouth daily.    [provider]  levETIRAcetam (KEPPRA) 750 MG tablet Take 1 tablet (750 mg total) by mouth 2 (two) times daily. 12/30/16   Charlynne PanderYao, David Hsienta, MD  LORazepam (ATIVAN) 0.5 MG tablet Take 0.5 mg by mouth every 8 (eight) hours as needed for anxiety.     [provider]  methocarbamol (ROBAXIN) 500 MG tablet Take 1 tablet (500 mg total) by mouth 2 (two) times daily. 03/27/17   Elpidio AnisUpstill, Shari, PA-C  ondansetron (ZOFRAN) 4 MG tablet Take 1 tablet (4 mg total) by mouth every 8 (eight) hours as needed for nausea or vomiting. 03/24/17   Fawze, Mina A, PA-C  oxyCODONE-acetaminophen (PERCOCET/ROXICET) 5-325 MG tablet Take 1-2 tablets by mouth every 6 (six) hours as needed for severe  pain. Patient not taking: Reported on 04/18/2017 03/08/17   Earley Favor, NP  predniSONE (DELTASONE) 20 MG tablet 2 tabs po daily x 4 days 04/20/17   Melene Plan, DO  Prenatal Vit-Fe Fumarate-FA (MULTIVITAMIN-PRENATAL) 27-0.8 MG TABS tablet Take 1 tablet by mouth daily at 12 noon.    [provider]  propranolol (INDERAL) 20 MG tablet Take 20 mg by mouth 2 (two) times daily.    [provider]  traMADol (ULTRAM) 50 MG tablet Take 1 tablet (50 mg total) by mouth every 6 (six) hours as needed. Patient not taking: Reported on 04/18/2017 12/15/16   Janne Napoleon, NP    Family History Family History  Problem Relation Age of Onset  . Obesity Mother   . Diabetes Mother   . Hypertension Mother   . Depression Mother   . Suicidality Mother   . Diabetes Father   . Hypertension Father   . Heart attack  Father     Social History Social History   Tobacco Use  . Smoking status: Never Smoker  . Smokeless tobacco: Never Used  Substance Use Topics  . Alcohol use: Yes    Comment: Rare  . Drug use: No     Allergies   Compazine [prochlorperazine edisylate]; Aripiprazole; Benadryl [diphenhydramine hcl]; Compazine [prochlorperazine edisylate]; Ketorolac tromethamine; Naprosyn [naproxen]; Nsaids; Penicillins; Sulfa antibiotics; and Toradol [ketorolac tromethamine]   Review of Systems Review of Systems  Musculoskeletal: Positive for arthralgias. Negative for back pain.  Skin: Positive for color change.  Neurological: Negative for syncope, weakness, numbness and headaches.     Physical Exam Updated Vital Signs BP 126/83 (BP Location: Right Arm)   Pulse 78   Temp 99 F (37.2 C) (Oral)   Resp 18   Ht 5\' 8"  (1.727 m)   Wt (!) 138.3 kg (305 lb)   LMP 06/01/2017 (Exact Date)   SpO2 100%   BMI 46.38 kg/m   Physical Exam  Constitutional: She appears well-developed and well-nourished. No distress.  Sitting comfortably in bed.  HENT:  Head: Normocephalic and atraumatic.  Eyes: Conjunctivae are normal. Right eye exhibits no discharge. Left eye exhibits no discharge.  EOMs normal to gross examination.  Neck: Normal range of motion.  Cardiovascular: Normal rate and regular rhythm.  Intact, 2+ radial pulse of the right upper extremity.  Pulmonary/Chest:  Normal respiratory effort. Patient converses comfortably. No audible wheeze or stridor.  Abdominal: She exhibits no distension.  Musculoskeletal: Normal range of motion.  There is ecchymosis overlying the lateral malleolus of the right elbow.  There is tenderness to the lateral malleolus.  No tenderness to the olecranon or medial malleolus.  No tenderness of the distal radius or ulna.  No snuffbox tenderness of the right hand.  Range of motion is intact with flexion, extension, supination, and pronation of the right upper extremity.   Extensor tendons intact of the right upper extremity at lateral malleolus attachment.  Biceps tendon intact with engagement. Sensation intact to light touch in the distal right upper extremity.  Neurological: She is alert.  Cranial nerves intact to gross observation. Patient moves extremities without difficulty.  Skin: Skin is warm and dry. She is not diaphoretic.  Psychiatric: She has a normal mood and affect. Her behavior is normal. Judgment and thought content normal.  Nursing note and vitals reviewed.    ED Treatments / Results  Labs (all labs ordered are listed, but only abnormal results are displayed) Labs Reviewed - No data to display  EKG  EKG Interpretation None       Radiology Dg Elbow Complete Right  Result Date: 06/15/2017 CLINICAL DATA:  Right elbow injury with bruising to the right elbow EXAM: RIGHT ELBOW - COMPLETE 3+ VIEW COMPARISON:  None. FINDINGS: There is no evidence of fracture, dislocation, or joint effusion. There is no evidence of arthropathy or other focal bone abnormality. Soft tissues are unremarkable. IMPRESSION: Negative. Electronically Signed   By: Jasmine PangKim  Fujinaga M.D.   On: 06/15/2017 20:46    Procedures Procedures (including critical care time)  Medications Ordered in ED Medications - No data to display   Initial Impression / Assessment and Plan / ED Course  I have reviewed the triage vital signs and the nursing notes.  Pertinent labs & imaging results that were available during my care of the patient were reviewed by me and considered in my medical decision making (see chart for details).      Final Clinical Impressions(s) / ED Diagnoses   Final diagnoses:  Injury of right elbow, initial encounter   Patient well-appearing in no acute distress.  Patient is neurovascularly intact in the right upper extremity.  There appears to be contusion overlying the lateral malleolus of the right elbow.  Will wrap and have patient rest, ice, elevate,  and compress.  Provided sports medicine referral for follow-up if needed.  Return precautions given for any increasing swelling, pain, loss of sensation.  Patient is in understanding and agrees with the plan of care.  ED Discharge Orders    None       Delia ChimesMurray, Alyssa B, PA-C 06/16/17 0047    Abelino DerrickMackuen, Courteney Lyn, MD 06/16/17 [phone]

## 2017-06-16 NOTE — ED Notes (Signed)
Patient able to ambulate independently  

## 2017-06-16 NOTE — Discharge Instructions (Signed)
Please read and follow all provided instructions.  Your diagnoses today include:  1. Injury of right elbow, initial encounter     Tests performed today include: An x-ray of your elbow- does NOT show any broken bones Vital signs. See below for your results today.   Medications prescribed:   Take any prescribed medications only as directed.  Please take Tylenol for pain.  Do not exceed 4 g of Tylenol in 1 day.  Home care instructions:  Follow any educational materials contained in this packet Wear your splint for at least one week or until seen by a physician for a follow-up examination. Follow R.I.C.E. Protocol: R - rest your injury  I  - use ice on injury without applying directly to skin C - compress injury with bandage or splint E - elevate the injury above the level of your heart as much as possible to reduce pain and swelling  Follow-up instructions: Please follow-up with your primary care provider or the provided orthopedic (bone specialist) if you continue to have significant pain or trouble using your elbow in 1 week. In this case you may have a severe injury that requires further care.   Return instructions:  Please return if your fingers are numb or tingling, appear very red, white, gray or blue, or you have severe pain (also elevate wrist and loosen splint or wrap) Please return if you have difficulty moving your fingers. Please return to the Emergency Department if you experience worsening symptoms.  Please return if you have any other emergent concerns.  Additional Information:  Your vital signs today were: BP 126/83 (BP Location: Right Arm)    Pulse 78    Temp 99 F (37.2 C) (Oral)    Resp 18    Ht 5\' 8"  (1.727 m)    Wt (!) 138.3 kg (305 lb)    LMP 06/01/2017 (Exact Date)    SpO2 100%    BMI 46.38 kg/m  If your blood pressure (BP) was elevated above 135/85 this visit, please have this repeated by your doctor within one month. --------------

## 2017-07-02 ENCOUNTER — Emergency Department (HOSPITAL_COMMUNITY): Payer: Self-pay

## 2017-07-02 ENCOUNTER — Emergency Department (HOSPITAL_COMMUNITY)
Admission: EM | Admit: 2017-07-02 | Discharge: 2017-07-02 | Disposition: A | Discharge status: Home / Self Care | Payer: Self-pay | Attending: Emergency Medicine | Admitting: Emergency Medicine

## 2017-07-02 ENCOUNTER — Encounter (HOSPITAL_COMMUNITY): Payer: Self-pay

## 2017-07-02 ENCOUNTER — Other Ambulatory Visit: Payer: Self-pay

## 2017-07-02 DIAGNOSIS — M546 Pain in thoracic spine: Secondary | ICD-10-CM | POA: Insufficient documentation

## 2017-07-02 DIAGNOSIS — R079 Chest pain, unspecified: Secondary | ICD-10-CM | POA: Insufficient documentation

## 2017-07-02 DIAGNOSIS — Z79899 Other long term (current) drug therapy: Secondary | ICD-10-CM | POA: Insufficient documentation

## 2017-07-02 LAB — I-STAT TROPONIN, ED
TROPONIN I, POC: 0 ng/mL (ref 0.00–0.08)
Troponin i, poc: 0 ng/mL (ref 0.00–0.08)

## 2017-07-02 LAB — CBC
HEMATOCRIT: 39.8 % (ref 36.0–46.0)
HEMOGLOBIN: 12.9 g/dL (ref 12.0–15.0)
MCH: 28.2 pg (ref 26.0–34.0)
MCHC: 32.4 g/dL (ref 30.0–36.0)
MCV: 87.1 fL (ref 78.0–100.0)
Platelets: 203 10*3/uL (ref 150–400)
RBC: 4.57 MIL/uL (ref 3.87–5.11)
RDW: 12.9 % (ref 11.5–15.5)
WBC: 8.3 10*3/uL (ref 4.0–10.5)

## 2017-07-02 LAB — BASIC METABOLIC PANEL
ANION GAP: 8 (ref 5–15)
BUN: 11 mg/dL (ref 6–20)
CHLORIDE: 106 mmol/L (ref 101–111)
CO2: 25 mmol/L (ref 22–32)
Calcium: 9.3 mg/dL (ref 8.9–10.3)
Creatinine, Ser: 0.98 mg/dL (ref 0.44–1.00)
GFR calc Af Amer: >60 mL/min (ref >60)
GLUCOSE: 112 mg/dL — AB (ref 65–99)
POTASSIUM: 3.8 mmol/L (ref 3.5–5.1)
Sodium: 139 mmol/L (ref 135–145)

## 2017-07-02 LAB — I-STAT BETA HCG BLOOD, ED (MC, WL, AP ONLY)

## 2017-07-02 MED ORDER — METHOCARBAMOL 500 MG PO TABS: 1000.0000 mg | ORAL_TABLET | Freq: Four times a day (QID) | ORAL | 0 refills | Status: DC

## 2017-07-02 MED ORDER — METHOCARBAMOL 500 MG PO TABS
1000.0000 mg | ORAL_TABLET | Freq: Once | ORAL | Status: AC
  Administered 2017-07-02: 1000 mg via ORAL
  Filled 2017-07-02: qty 2

## 2017-07-02 MED ORDER — GI COCKTAIL ~~LOC~~
30.0000 mL | Freq: Once | ORAL | Status: AC
  Administered 2017-07-02: 30 mL via ORAL
  Filled 2017-07-02: qty 30

## 2017-07-02 NOTE — ED Provider Notes (Signed)
[facility] Provider [facility]   CSN: [id number] Arrival date & time: 07/02/17  1521     History   Chief Complaint Chief Complaint  Patient presents with  . Chest Pain    HPI Victoria Middleton is a 36 y.o. female.  Patient presents with complaint of chest pain starting approximately 2 PM today.  Patient describes acute onset of pressure in her upper chest with radiation to her neck and shoulders.  She had 2 episodes of vomiting associated.  Patient was scared and came immediately to the emergency department without any treatments.  No lightheadedness or syncope.  No recent exertional symptoms or previous symptoms in the past similar to this.  No lower extremity swelling. Patient denies risk factors for pulmonary embolism including: unilateral leg swelling, history of DVT/PE/other blood clots, use of exogenous hormones, recent immobilizations, recent surgery, recent travel (>4hr segment), malignancy, hemoptysis.  No history of hypertension, high cholesterol, diabetes, smoking.  Father had an MI in his late 6450s, no other family members at a young age. The course is improving. Aggravating factors: none. Alleviating factors: none.         Past Medical History:  Diagnosis Date  . Depression   . Drug-seeking behavior   . Headache(784.0)   . Kidney stones   . Migraine   . Narcotic abuse (HCC)   . Seizures (HCC)   . Suicide attempt [facility](HCC)     Patient Active Problem List   Diagnosis Date Noted  . Overdose 11/24/2016  . Elevated serum creatinine 11/24/2016  . Seizure disorder (HCC) 08/24/2016  . Intractable chronic migraine without aura and with status migrainosus 01/24/2016  . Major depressive disorder, recurrent episode, moderate (HCC) 09/13/2012    Past Surgical History:  Procedure Laterality Date  . ANKLE SURGERY     left  . ANTERIOR CERVICAL DECOMP/DISCECTOMY FUSION  2018  . APPENDECTOMY    . CHOLECYSTECTOMY    . KNEE SURGERY    .  TONSILLECTOMY      OB History    Gravida Para Term Preterm AB Living   1             SAB TAB Ectopic Multiple Live Births                   Home Medications    Prior to Admission medications   Medication Sig Start Date End Date Taking? Authorizing Provider  acetaminophen (TYLENOL) 325 MG tablet Take 2 tablets (650 mg total) by mouth every 6 (six) hours as needed for mild pain or moderate pain. 12/10/16   Antony MaduraHumes, Kelly, PA-C  aspirin-acetaminophen-caffeine (EXCEDRIN MIGRAINE) [phone] MG tablet Take 2 tablets by mouth every 6 (six) hours as needed for headache.    [provider]  FLUoxetine (PROZAC) 40 MG capsule Take 40 mg by mouth daily.    [provider]  folic acid (FOLVITE) 1 MG tablet Take 4 mg by mouth daily.    [provider]  levETIRAcetam (KEPPRA) 750 MG tablet Take 1 tablet (750 mg total) by mouth 2 (two) times daily. 12/30/16   Charlynne PanderYao, David Hsienta, MD  LORazepam (ATIVAN) 0.5 MG tablet Take 0.5 mg by mouth every 8 (eight) hours as needed for anxiety.     [provider]  methocarbamol (ROBAXIN) 500 MG tablet Take 1 tablet (500 mg total) by mouth 2 (two) times daily. 03/27/17   Elpidio AnisUpstill, Shari, PA-C  ondansetron (ZOFRAN) 4 MG tablet Take 1 tablet (4 mg total)  by mouth every 8 (eight) hours as needed for nausea or vomiting. 03/24/17   Fawze, Mina A, PA-C  oxyCODONE-acetaminophen (PERCOCET/ROXICET) 5-325 MG tablet Take 1-2 tablets by mouth every 6 (six) hours as needed for severe pain. Patient not taking: Reported on 04/18/2017 03/08/17   Earley Favor, NP  predniSONE (DELTASONE) 20 MG tablet 2 tabs po daily x 4 days 04/20/17   Melene Plan, DO  Prenatal Vit-Fe Fumarate-FA (MULTIVITAMIN-PRENATAL) 27-0.8 MG TABS tablet Take 1 tablet by mouth daily at 12 noon.    [provider]  propranolol (INDERAL) 20 MG tablet Take 20 mg by mouth 2 (two) times daily.    [provider]  traMADol (ULTRAM) 50 MG tablet Take 1 tablet (50 mg total) by  mouth every 6 (six) hours as needed. Patient not taking: Reported on 04/18/2017 12/15/16   Janne Napoleon, NP    Family History Family History  Problem Relation Age of Onset  . Obesity Mother   . Diabetes Mother   . Hypertension Mother   . Depression Mother   . Suicidality Mother   . Diabetes Father   . Hypertension Father   . Heart attack Father     Social History Social History   Tobacco Use  . Smoking status: Never Smoker  . Smokeless tobacco: Never Used  Substance Use Topics  . Alcohol use: Yes    Comment: Rare  . Drug use: No     Allergies   Compazine [prochlorperazine edisylate]; Aripiprazole; Benadryl [diphenhydramine hcl]; Compazine [prochlorperazine edisylate]; Ketorolac tromethamine; Naprosyn [naproxen]; Nsaids; Penicillins; Sulfa antibiotics; and Toradol [ketorolac tromethamine]   Review of Systems Review of Systems  Constitutional: Negative for diaphoresis and fever.  Eyes: Negative for redness.  Respiratory: Positive for chest tightness. Negative for cough and shortness of breath.   Cardiovascular: Positive for chest pain. Negative for palpitations and leg swelling.  Gastrointestinal: Positive for nausea and vomiting. Negative for abdominal pain.  Genitourinary: Negative for dysuria.  Musculoskeletal: Positive for neck pain. Negative for back pain.  Skin: Negative for rash.  Neurological: Negative for syncope and light-headedness.  Psychiatric/Behavioral: The patient is not nervous/anxious.      Physical Exam Updated Vital Signs BP 129/89   Pulse 88   Temp 98.8 F (37.1 C) (Oral)   Resp 16   Ht 5\' 8"  (1.727 m)   Wt 136.1 kg (300 lb)   LMP 06/28/2017 (Exact Date)   SpO2 100%   BMI 45.61 kg/m   Physical Exam  Constitutional: She appears well-developed and well-nourished.  HENT:  Head: Normocephalic and atraumatic.  Mouth/Throat: Mucous membranes are normal. Mucous membranes are not dry.  Eyes: Conjunctivae are normal.  Neck: Trachea normal  and normal range of motion. Neck supple. Normal carotid pulses and no JVD present. No muscular tenderness present. Carotid bruit is not present. No tracheal deviation present.  Cardiovascular: Normal rate, regular rhythm, S1 normal, S2 normal, normal heart sounds and intact distal pulses. Exam reveals no decreased pulses.  No murmur heard. Pulmonary/Chest: Effort normal. No respiratory distress. She has no wheezes. She exhibits no tenderness.  Abdominal: Soft. Normal aorta and bowel sounds are normal. There is no tenderness. There is no rebound and no guarding.  Musculoskeletal: Normal range of motion.  Neurological: She is alert.  Skin: Skin is warm and dry. She is not diaphoretic. No cyanosis. No pallor.  Psychiatric: She has a normal mood and affect.  Nursing note and vitals reviewed.    ED Treatments / Results  Labs (all  labs ordered are listed, but only abnormal results are displayed) Labs Reviewed  BASIC METABOLIC PANEL - Abnormal; Notable for the following components:      Result Value   Glucose, Bld 112 (*)    All other components within normal limits  CBC  I-STAT TROPONIN, ED  I-STAT BETA HCG BLOOD, ED ([facility], [facility], [facility] ONLY)  I-STAT TROPONIN, ED    ED ECG REPORT   Date: 07/02/2017  Rate: 91  Rhythm: normal sinus rhythm  QRS Axis: normal  Intervals: normal  ST/T Wave abnormalities: nonspecific ST changes  Conduction Disutrbances:none  Narrative Interpretation:   Old EKG Reviewed: unchanged since 12/2016  I have personally reviewed the EKG tracing and agree with the computerized printout as noted.  Radiology Dg Chest 2 View  Result Date: 07/02/2017 CLINICAL DATA:  Onset of mid chest pain 3 hours ago radiating into the left jaw and arm and into the back. Thus was associated with shortness breath. History of chronic migraines, seizure disorder, and depressive disorder. EXAM: CHEST  2 VIEW COMPARISON:  Chest x-ray of December 30, 2016 and chest x-ray of October 22, 2016 and chest  CT scan of October 23, 2016. FINDINGS: The lungs are adequately inflated. There is no focal infiltrate. There is linear subsegmental atelectasis versus scarring in the lingula laterally. The heart and pulmonary vascularity are normal. The mediastinum is normal in width. There is no pleural effusion. The bony thorax exhibits no acute abnormality. IMPRESSION: There is no pneumonia nor CHF. Linear density in the lingula likely reflects subsegmental atelectasis or scarring. Electronically Signed   By: David  SwazilandJordan M.D.   On: 07/02/2017 17:07    Procedures Procedures (including critical care time)  Medications Ordered in ED Medications  gi cocktail (Maalox,Lidocaine,Donnatal) (30 mLs Oral Given 07/02/17 2224)  methocarbamol (ROBAXIN) tablet 1,000 mg (1,000 mg Oral Given 07/02/17 2224)     Initial Impression / Assessment and Plan / ED Course  I have reviewed the triage vital signs and the nursing notes.  Pertinent labs & imaging results that were available during my care of the patient were reviewed by me and considered in my medical decision making (see chart for details).     Patient seen and examined. Work-up initiated. Medications ordered. EKG abnormalities noted but unchanged from June 2018.  Plan is to recheck EKG and troponin.  Vital signs reviewed and are as follows: BP 129/89   Pulse 88   Temp 98.8 F (37.1 C) (Oral)   Resp 16   Ht 5\' 8"  (1.727 m)   Wt 136.1 kg (300 lb)   LMP 06/28/2017 (Exact Date)   SpO2 100%   BMI 45.61 kg/m   10:22 PM second troponin and repeat EKG are unchanged.  I informed patient of results.  Upon entering the room she is sitting upright on the side of the bed crying.  Family member not bedside.  She describes a tightness in her upper back across her shoulder blades.  Mild shortness of breath.  She does not appear to be in any respiratory distress.  Lung sounds are clear on exam.  Pain is not reproducible to palpation.  Robaxin ordered.  Will give GI  cocktail as well and recheck.  11:24 PM Pain has eased and patient is requesting discharge.   Patient was counseled on back pain precautions and told to do activity as tolerated but do not lift, push, or pull heavy objects more than 10 pounds for the next week.  Patient counseled to use  ice or heat on back for no longer than 15 minutes every hour.   Patient prescribed muscle relaxer and counseled on proper use of muscle relaxant medication.    Patient urged to follow-up with PCP if pain does not improve with treatment and rest or if pain becomes recurrent. Urged to return with worsening severe pain, loss of bowel or bladder control, trouble walking.   The patient verbalizes understanding and agrees with the plan.   Final Clinical Impressions(s) / ED Diagnoses   Final diagnoses:  Nonspecific chest pain  Acute bilateral thoracic back pain   CP/Back pain: Troponin negx2, EKG unchanged x 2, PERC neg, CXR clear. Reassuring exam. No significant cardiac risk factors other than weight. Symptoms migrating during ED stay. Feels better now. Vitals WNL. Comfortable with discharged home at this time with conservative management.   ED Discharge Orders        Ordered    methocarbamol (ROBAXIN) 500 MG tablet  4 times daily     07/02/17 2329       Renne Crigler, PA-C 07/02/17 2332    Melene Plan, DO 07/02/17 2333

## 2017-07-02 NOTE — Discharge Instructions (Signed)
Please read and follow all provided instructions.  Your diagnoses today include:  1. Nonspecific chest pain   2. Acute bilateral thoracic back pain     Tests performed today include:  An EKG of your heart  A chest x-ray  Cardiac enzymes - a blood test for heart muscle damage  Blood counts and electrolytes  Vital signs. See below for your results today.   Medications prescribed:   Robaxin (methocarbamol) - muscle relaxer medication  DO NOT drive or perform any activities that require you to be awake and alert because this medicine can make you drowsy.   Take any prescribed medications only as directed.  Follow-up instructions: Please follow-up with your primary care provider as soon as you can for further evaluation of your symptoms.   Return instructions:  SEEK IMMEDIATE MEDICAL ATTENTION IF:  You have severe chest pain, especially if the pain is crushing or pressure-like and spreads to the arms, back, neck, or jaw, or if you have sweating, nausea (feeling sick to your stomach), or shortness of breath. THIS IS AN EMERGENCY. Don't wait to see if the pain will go away. Get medical help at once. Call 911 or [phone] (operator). DO NOT drive yourself to the hospital.   Your chest pain gets worse and does not go away with rest.   You have an attack of chest pain lasting longer than usual, despite rest and treatment with the medications your caregiver has prescribed.   You wake from sleep with chest pain or shortness of breath.  You feel dizzy or faint.  You have chest pain not typical of your usual pain for which you originally saw your caregiver.   You have any other emergent concerns regarding your health.  Additional Information: Chest pain comes from many different causes. Your caregiver has diagnosed you as having chest pain that is not specific for one problem, but does not require admission.  You are at low risk for an acute heart condition or other serious illness.    Your vital signs today were: BP (!) 152/92    Pulse 77    Temp 98.8 F (37.1 C) (Oral)    Resp 16    Ht 5\' 8"  (1.727 m)    Wt 136.1 kg (300 lb)    LMP 06/28/2017 (Exact Date)    SpO2 99%    BMI 45.61 kg/m  If your blood pressure (BP) was elevated above 135/85 this visit, please have this repeated by your doctor within one month. --------------

## 2017-07-02 NOTE — ED Notes (Signed)
ED Provider at bedside. 

## 2017-07-02 NOTE — ED Notes (Signed)
Pt understood dc material. NAD noted. Scripts given at dc 

## 2017-07-02 NOTE — ED Triage Notes (Signed)
Pt states chest pain that began around 1415. She states she feels tightness in her chest radiating into her jaw and down her left arm. Pt also has 2 episodes of vomiting and the dry heaves. Skin warm and dry.

## 2017-07-18 ENCOUNTER — Other Ambulatory Visit: Payer: Self-pay

## 2017-07-18 ENCOUNTER — Emergency Department
Admission: EM | Admit: 2017-07-18 | Discharge: 2017-07-18 | Disposition: A | Discharge status: Home / Self Care | Payer: Self-pay | Attending: Emergency Medicine | Admitting: Emergency Medicine

## 2017-07-18 ENCOUNTER — Encounter: Payer: Self-pay | Admitting: Emergency Medicine

## 2017-07-18 DIAGNOSIS — R1031 Right lower quadrant pain: Secondary | ICD-10-CM | POA: Insufficient documentation

## 2017-07-18 DIAGNOSIS — R109 Unspecified abdominal pain: Secondary | ICD-10-CM

## 2017-07-18 DIAGNOSIS — G8929 Other chronic pain: Secondary | ICD-10-CM | POA: Insufficient documentation

## 2017-07-18 LAB — CBC
HEMATOCRIT: 39.6 % (ref 35.0–47.0)
HEMOGLOBIN: 13.2 g/dL (ref 12.0–16.0)
MCH: 28.3 pg (ref 26.0–34.0)
MCHC: 33.4 g/dL (ref 32.0–36.0)
MCV: 84.8 fL (ref 80.0–100.0)
Platelets: 190 10*3/uL (ref 150–440)
RBC: 4.67 MIL/uL (ref 3.80–5.20)
RDW: 13.5 % (ref 11.5–14.5)
WBC: 7.6 10*3/uL (ref 3.6–11.0)

## 2017-07-18 LAB — URINALYSIS, COMPLETE (UACMP) WITH MICROSCOPIC
BACTERIA UA: NONE SEEN
Bilirubin Urine: NEGATIVE
Glucose, UA: NEGATIVE mg/dL
HGB URINE DIPSTICK: NEGATIVE
KETONES UR: 5 mg/dL — AB
Nitrite: NEGATIVE
PROTEIN: NEGATIVE mg/dL
SPECIFIC GRAVITY, URINE: 1.027 (ref 1.005–1.030)
pH: 5 (ref 5.0–8.0)

## 2017-07-18 LAB — COMPREHENSIVE METABOLIC PANEL
ALBUMIN: 4.8 g/dL (ref 3.5–5.0)
ALT: 11 U/L — ABNORMAL LOW (ref 14–54)
ANION GAP: 10 (ref 5–15)
AST: 17 U/L (ref 15–41)
Alkaline Phosphatase: 64 U/L (ref 38–126)
BUN: 18 mg/dL (ref 6–20)
CHLORIDE: 105 mmol/L (ref 101–111)
CO2: 22 mmol/L (ref 22–32)
Calcium: 8.9 mg/dL (ref 8.9–10.3)
Creatinine, Ser: 0.98 mg/dL (ref 0.44–1.00)
GFR calc Af Amer: >60 mL/min (ref >60)
GFR calc non Af Amer: >60 mL/min (ref >60)
GLUCOSE: 103 mg/dL — AB (ref 65–99)
POTASSIUM: 4 mmol/L (ref 3.5–5.1)
Sodium: 137 mmol/L (ref 135–145)
Total Bilirubin: 0.6 mg/dL (ref 0.3–1.2)
Total Protein: 7.9 g/dL (ref 6.5–8.1)

## 2017-07-18 LAB — POCT PREGNANCY, URINE: Preg Test, Ur: NEGATIVE

## 2017-07-18 LAB — LIPASE, BLOOD: LIPASE: 22 U/L (ref 11–51)

## 2017-07-18 MED ORDER — ONDANSETRON 4 MG PO TBDP
4.0000 mg | ORAL_TABLET | Freq: Once | ORAL | Status: AC
  Administered 2017-07-18: 4 mg via ORAL
  Filled 2017-07-18: qty 1

## 2017-07-18 MED ORDER — ONDANSETRON HCL 4 MG PO TABS: 4.0000 mg | ORAL_TABLET | Freq: Three times a day (TID) | ORAL | 0 refills | Status: DC | PRN

## 2017-07-18 NOTE — ED Provider Notes (Signed)
[facility] Emergency Department Provider Note  ____________________________________________   I have reviewed the triage vital signs and the nursing notes. Where available I have reviewed prior notes and, if possible and indicated, outside hospital notes.    HISTORY  Chief Complaint Abdominal Pain; Nausea; and Emesis    HPI Victoria Middleton is a 37 y.o. female who has had by my count over 30 different visits to emergency rooms, around this area, for the last year, almost always for pain related complaints.  [facility] she has had 34 different prescriptions for controlled substances of one variety or another in the last 2 years, at least.  Patient is here with right lower quadrant pain in the area of her prior surgery.  This is a chronic recurrent complaint.  She has had multiple different negative CT scans for this.  In fact she has had 3 at least that I can find this year for this exact same complaint.  She complains of pain and nausea which has been present for several days.  Has not followed up as an outpatient.  States nothing helps the pain nothing makes it better nothing makes it worse normal bowel movements no hematemesis no bright red blood per rectum.  Denies fever or chills.  Same pain that she always gets.  She cannot take any medications for this at home for pain aside from Tylenol because of various allergies that she complains of.  Passively cannot take anything but narcotics or Tylenol because of rashes this includes Toradol specifically as well as all NSAIDs. Has had appendicitis in the past.  Location: Right lower quadrant Radiation: None Quality: Sharp Duration: Years Timing: 2 days this time Severity: described as severe Associated sxs: Nausea PriorTreatment : Multiple visits to emergency room's multiple CTs   Past Medical History:  Diagnosis Date  . Depression   . Drug-seeking behavior   . Headache(784.0)   . Kidney stones   .  Migraine   . Narcotic abuse (HCC)   . Seizures (HCC)   . Suicide attempt [facility](HCC)     Patient Active Problem List   Diagnosis Date Noted  . Overdose 11/24/2016  . Elevated serum creatinine 11/24/2016  . Seizure disorder (HCC) 08/24/2016  . Intractable chronic migraine without aura and with status migrainosus 01/24/2016  . Major depressive disorder, recurrent episode, moderate (HCC) 09/13/2012    Past Surgical History:  Procedure Laterality Date  . ANKLE SURGERY     left  . ANTERIOR CERVICAL DECOMP/DISCECTOMY FUSION  2018  . APPENDECTOMY    . CHOLECYSTECTOMY    . KNEE SURGERY    . TONSILLECTOMY      Prior to Admission medications   Medication Sig Start Date End Date Taking? Authorizing Provider  acetaminophen (TYLENOL) 325 MG tablet Take 2 tablets (650 mg total) by mouth every 6 (six) hours as needed for mild pain or moderate pain. Patient taking differently: Take 10,000 mg by mouth every 6 (six) hours as needed for mild pain or moderate pain.  12/10/16   Antony MaduraHumes, Kelly, PA-C  methocarbamol (ROBAXIN) 500 MG tablet Take 2 tablets (1,000 mg total) by mouth 4 (four) times daily. 07/02/17   Renne CriglerGeiple, Joshua, PA-C    Allergies Compazine [prochlorperazine edisylate]; Aripiprazole; Benadryl [diphenhydramine hcl]; Compazine [prochlorperazine edisylate]; Ketorolac tromethamine; Naprosyn [naproxen]; Nsaids; Penicillins; Sulfa antibiotics; and Toradol [ketorolac tromethamine]  Family History  Problem Relation Age of Onset  . Obesity Mother   . Diabetes Mother   . Hypertension Mother   .  Depression Mother   . Suicidality Mother   . Diabetes Father   . Hypertension Father   . Heart attack Father     Social History Social History   Tobacco Use  . Smoking status: Never Smoker  . Smokeless tobacco: Never Used  Substance Use Topics  . Alcohol use: Yes    Comment: Rare  . Drug use: No    Review of Systems Constitutional: No fever/chills Eyes: No visual changes. ENT: No sore  throat. No stiff neck no neck pain Cardiovascular: Denies chest pain. Respiratory: Denies shortness of breath. Gastrointestinal:   + vomiting.  No diarrhea.  No constipation. Genitourinary: Negative for dysuria. Musculoskeletal: Negative lower extremity swelling Skin: Negative for rash. Neurological: Negative for severe headaches, focal weakness or numbness.   ____________________________________________   PHYSICAL EXAM:  VITAL SIGNS: ED Triage Vitals  Enc Vitals Group     BP 07/18/17 1325 (!) 107/91     Pulse Rate 07/18/17 1325 83     Resp 07/18/17 1325 20     Temp 07/18/17 1325 98.4 F (36.9 C)     Temp Source 07/18/17 1325 Oral     SpO2 07/18/17 1325 100 %     Weight 07/18/17 1327 300 lb (136.1 kg)     Height --      Head Circumference --      Peak Flow --      Pain Score 07/18/17 1325 8     Pain Loc --      Pain Edu? --      Excl. in GC? --     Constitutional: Alert and oriented. Well appearing and in no acute distress.  Include she is tearful and upset however Eyes: Conjunctivae are normal Head: Atraumatic HEENT: No congestion/rhinnorhea. Mucous membranes are moist.  Oropharynx non-erythematous Neck:   Nontender with no meningismus, no masses, no stridor Cardiovascular: Normal rate, regular rhythm. Grossly normal heart sounds.  Good peripheral circulation. Respiratory: Normal respiratory effort.  No retractions. Lungs CTAB. Abdominal: Soft and intractable tenderness the right lower quadrant nonsurgical abdomen obesity noted. No distention. No guarding no rebound Back:  There is no focal tenderness or step off.  there is no midline tenderness there are no lesions noted. there is no CVA tenderness Musculoskeletal: No lower extremity tenderness, no upper extremity tenderness. No joint effusions, no DVT signs strong distal pulses no edema Neurologic:  Normal speech and language. No gross focal neurologic deficits are appreciated.  Skin:  Skin is warm, dry and intact. No  rash noted. Psychiatric: Mood and affect are anxious. Speech and behavior are normal.  ____________________________________________   LABS (all labs ordered are listed, but only abnormal results are displayed)  Labs Reviewed  COMPREHENSIVE METABOLIC PANEL - Abnormal; Notable for the following components:      Result Value   Glucose, Bld 103 (*)    ALT 11 (*)    All other components within normal limits  URINALYSIS, COMPLETE (UACMP) WITH MICROSCOPIC - Abnormal; Notable for the following components:   Color, Urine YELLOW (*)    APPearance CLEAR (*)    Ketones, ur 5 (*)    Leukocytes, UA TRACE (*)    Squamous Epithelial / LPF 0-5 (*)    All other components within normal limits  LIPASE, BLOOD  CBC  POC URINE PREG, ED  POCT PREGNANCY, URINE    Pertinent labs  results that were available during my care of the patient were reviewed by me and considered in my medical decision  making (see chart for details). ____________________________________________  EKG  I personally interpreted any EKGs ordered by me or triage  ____________________________________________  RADIOLOGY  Pertinent labs & imaging results that were available during my care of the patient were reviewed by me and considered in my medical decision making (see chart for details). If possible, patient and/or family made aware of any abnormal findings.  No results found. ____________________________________________    PROCEDURES  Procedure(s) performed: None  Procedures  Critical Care performed: None  ____________________________________________   INITIAL IMPRESSION / ASSESSMENT AND PLAN / ED COURSE  Pertinent labs & imaging results that were available during my care of the patient were reviewed by me and considered in my medical decision making (see chart for details).  Patient with chronic recurrent abdominal pain despite extensive and recurrent emergency room visits for this and other pain complaints.   Abdomen is nonsurgical medical screening exam betrays no evidence of acute pathology today, we will discharge her with antinausea medication, over-the-counter pain medication such as she can tolerate and return precautions and follow-up given and understood.  She has strong component of drug-seeking behavior with allergies to nonnarcotic pain medications multiple different providers providing multiple different prescriptions for pain medications from multiple different facilities for multiple unsubstantiated pain related complaints.    ____________________________________________   FINAL CLINICAL IMPRESSION(S) / ED DIAGNOSES  Final diagnoses:  None      This chart was dictated using voice recognition software.  Despite best efforts to proofread,  errors can occur which can change meaning.      Jeanmarie Plant, MD 07/18/17 [phone]

## 2017-07-18 NOTE — ED Triage Notes (Signed)
Pt with right sided abd pain since Monday with nausea and vomiting.

## 2017-07-18 NOTE — ED Notes (Signed)
Pt not happy with her care saying that she is not crazy, that she cannot believe that no one will take her seriously. Apologized to pt, encouraged her to come back if pain increases, and to follow up with her MD. Pt very unhappy, cussing when leaving.

## 2017-07-23 ENCOUNTER — Encounter (HOSPITAL_COMMUNITY): Payer: Self-pay | Admitting: Emergency Medicine

## 2017-07-23 ENCOUNTER — Ambulatory Visit (HOSPITAL_COMMUNITY)
Admission: EM | Admit: 2017-07-23 | Discharge: 2017-07-23 | Disposition: A | Discharge status: Home / Self Care | Payer: Self-pay | Attending: Family Medicine | Admitting: Family Medicine

## 2017-07-23 ENCOUNTER — Other Ambulatory Visit: Payer: Self-pay

## 2017-07-23 DIAGNOSIS — J069 Acute upper respiratory infection, unspecified: Secondary | ICD-10-CM

## 2017-07-23 DIAGNOSIS — R05 Cough: Secondary | ICD-10-CM

## 2017-07-23 DIAGNOSIS — B9789 Other viral agents as the cause of diseases classified elsewhere: Secondary | ICD-10-CM

## 2017-07-23 DIAGNOSIS — J4521 Mild intermittent asthma with (acute) exacerbation: Secondary | ICD-10-CM

## 2017-07-23 MED ORDER — IPRATROPIUM-ALBUTEROL 0.5-2.5 (3) MG/3ML IN SOLN
3.0000 mL | Freq: Once | RESPIRATORY_TRACT | Status: AC
  Administered 2017-07-23: 3 mL via RESPIRATORY_TRACT

## 2017-07-23 MED ORDER — IPRATROPIUM-ALBUTEROL 0.5-2.5 (3) MG/3ML IN SOLN
RESPIRATORY_TRACT | Status: AC
  Filled 2017-07-23: qty 3

## 2017-07-23 MED ORDER — HYDROCOD POLST-CPM POLST ER 10-8 MG/5ML PO SUER: 5.0000 mL | Freq: Two times a day (BID) | ORAL | 0 refills | Status: DC | PRN

## 2017-07-23 NOTE — ED Provider Notes (Signed)
[facility]    CSN: [id number] Arrival date & time: 07/23/17  1003     History   Chief Complaint Chief Complaint  Patient presents with  . URI    HPI Victoria Middleton is a 37 y.o. female.   37 year old female presents with low grade fever for the past week. Today developed irritating cough and slight sinus drainage. Denies any sore throat, or nausea. Had some abdominal pain and vomiting last week but has resolved now. Has taken Tylenol for fever with some relief and took Albuterol inhaler this morning for cough with minimal success. Daughter sick with fever and sore throat but no cough. Has history of multiple allergies to medications. Also has history of restrictive airway disease with respiratory illnesses and uses Albuterol inhaler prn.    The history is provided by the patient and the spouse.    Past Medical History:  Diagnosis Date  . Depression   . Drug-seeking behavior   . Headache(784.0)   . Kidney stones   . Migraine   . Narcotic abuse (HCC)   . Seizures (HCC)   . Suicide attempt [facility])     Patient Active Problem List   Diagnosis Date Noted  . Overdose 11/24/2016  . Elevated serum creatinine 11/24/2016  . Seizure disorder (HCC) 08/24/2016  . Intractable chronic migraine without aura and with status migrainosus 01/24/2016  . Major depressive disorder, recurrent episode, moderate (HCC) 09/13/2012    Past Surgical History:  Procedure Laterality Date  . ANKLE SURGERY     left  . ANTERIOR CERVICAL DECOMP/DISCECTOMY FUSION  2018  . APPENDECTOMY    . CHOLECYSTECTOMY    . KNEE SURGERY    . TONSILLECTOMY      OB History    Gravida Para Term Preterm AB Living   0             SAB TAB Ectopic Multiple Live Births                   Home Medications    Prior to Admission medications   Medication Sig Start Date End Date Taking? Authorizing Provider  chlorpheniramine-HYDROcodone (TUSSIONEX PENNKINETIC ER) 10-8 MG/5ML SUER Take 5 mLs by mouth  every 12 (twelve) hours as needed for cough. 07/23/17   Sudie Grumbling, NP  ondansetron (ZOFRAN) 4 MG tablet Take 1 tablet (4 mg total) by mouth every 8 (eight) hours as needed for nausea or vomiting. 07/18/17   Jeanmarie Plant, MD    Family History Family History  Problem Relation Age of Onset  . Obesity Mother   . Diabetes Mother   . Hypertension Mother   . Depression Mother   . Suicidality Mother   . Diabetes Father   . Hypertension Father   . Heart attack Father     Social History Social History   Tobacco Use  . Smoking status: Never Smoker  . Smokeless tobacco: Never Used  Substance Use Topics  . Alcohol use: Yes    Comment: Rare  . Drug use: No     Allergies   Compazine [prochlorperazine edisylate]; Aripiprazole; Benadryl [diphenhydramine hcl]; Compazine [prochlorperazine edisylate]; Ketorolac tromethamine; Naprosyn [naproxen]; Nsaids; Penicillins; Sulfa antibiotics; and Toradol [ketorolac tromethamine]   Review of Systems Review of Systems  Constitutional: Positive for appetite change, chills, fatigue and fever.  HENT: Positive for congestion and postnasal drip. Negative for ear discharge, ear pain, facial swelling, hearing loss, mouth sores, nosebleeds, sinus pressure, sinus pain, sneezing, sore throat  and trouble swallowing.   Eyes: Negative for pain, discharge, redness and itching.  Respiratory: Positive for cough, chest tightness, shortness of breath and wheezing.   Cardiovascular: Negative for chest pain.  Gastrointestinal: Negative for abdominal pain, diarrhea and vomiting.  Musculoskeletal: Positive for arthralgias and myalgias. Negative for neck pain and neck stiffness.  Skin: Negative for rash and wound.  Neurological: Positive for headaches. Negative for dizziness, tremors, seizures, syncope, weakness, light-headedness and numbness.  Hematological: Negative for adenopathy. Does not bruise/bleed easily.     Physical Exam Triage Vital Signs ED Triage  Vitals  Enc Vitals Group     BP 07/23/17 1041 123/78     Pulse Rate 07/23/17 1041 85     Resp 07/23/17 1041 (!) 26     Temp 07/23/17 1041 98.8 F (37.1 C)     Temp Source 07/23/17 1041 Oral     SpO2 07/23/17 1041 100 %     Weight --      Height --      Head Circumference --      Peak Flow --      Pain Score 07/23/17 1039 3     Pain Loc --      Pain Edu? --      Excl. in GC? --    No data found.  Updated Vital Signs BP 123/78 (BP Location: Left Arm) Comment (BP Location): large cuff  Pulse 85   Temp 98.8 F (37.1 C) (Oral)   Resp (!) 26 Comment: frequent coughing  LMP 06/28/2017 (Exact Date)   SpO2 100%   Visual Acuity Right Eye Distance:   Left Eye Distance:   Bilateral Distance:    Right Eye Near:   Left Eye Near:    Bilateral Near:     Physical Exam  Constitutional: She is oriented to person, place, and time. She appears well-developed and well-nourished. She does not appear ill. No distress.  Patient sitting comfortably in exam chair but coughing very frequently.   HENT:  Head: Normocephalic and atraumatic.  Right Ear: Hearing, tympanic membrane, external ear and ear canal normal.  Left Ear: Hearing, tympanic membrane, external ear and ear canal normal.  Nose: Rhinorrhea present. Right sinus exhibits no maxillary sinus tenderness and no frontal sinus tenderness. Left sinus exhibits no maxillary sinus tenderness and no frontal sinus tenderness.  Mouth/Throat: Uvula is midline, oropharynx is clear and moist and mucous membranes are normal.  Eyes: Conjunctivae and EOM are normal.  Neck: Normal range of motion. Neck supple.  Cardiovascular: Normal rate, regular rhythm and normal heart sounds.  No murmur heard. Pulmonary/Chest: Effort normal. No respiratory distress. She has decreased breath sounds in the right upper field, the right lower field, the left upper field and the left lower field. She has wheezes in the right upper field and the left upper field. She has  no rhonchi. She has no rales.  Musculoskeletal: Normal range of motion.  Lymphadenopathy:    She has no cervical adenopathy.  Neurological: She is alert and oriented to person, place, and time.  Skin: Skin is warm and dry. Capillary refill takes less than 2 seconds.  Psychiatric: She has a normal mood and affect. Her behavior is normal. Judgment and thought content normal.     UC Treatments / Results  Labs (all labs ordered are listed, but only abnormal results are displayed) Labs Reviewed - No data to display  EKG  EKG Interpretation None       Radiology No results found.  Procedures Procedures (including critical care time)  Medications Ordered in UC Medications  ipratropium-albuterol (DUONEB) 0.5-2.5 (3) MG/3ML nebulizer solution 3 mL (3 mLs Nebulization Given 07/23/17 1131)     Initial Impression / Assessment and Plan / UC Course  I have reviewed the triage vital signs and the nursing notes.  Pertinent labs & imaging results that were available during my care of the patient were reviewed by me and considered in my medical decision making (see chart for details).    Gave DuoNeb nebulizer treatment- patient still coughing after procedure but less wheezes present on exam. Reviewed that she probably has a viral illness- probably not influenza since her fever started about 1 week ago and she is just now developing a cough. Discussed that she continue her Albuterol inhaler 2 puffs every 4 to 6 hours as needed for coughing and wheezing. Increase fluid intake to help loosen up any mucus. May take Tylenol 1000mg  every 8 hours as needed for fever. Did not recommend NSAIDs or Prednisone due to allergy. Has taken Tessalon in the past with no success. May use Tussionex 1 teaspoon every 12 hours as needed for cough. Follow-up in 4 to 5 days with her PCP if not improving.   Final Clinical Impressions(s) / UC Diagnoses   Final diagnoses:  Viral URI with cough  Mild intermittent  reactive airway disease with acute exacerbation    ED Discharge Orders        Ordered    chlorpheniramine-HYDROcodone (TUSSIONEX PENNKINETIC ER) 10-8 MG/5ML SUER  Every 12 hours PRN     07/23/17 1205       Controlled Substance Prescriptions [place] Controlled Substance Registry consulted? Yes, I have consulted the [place] Controlled Substances Registry for this patient, and feel the risk/benefit ratio today is favorable for proceeding with this prescription for a controlled substance. Reviewed her history of previous substance abuse. Last controlled medication was Lorazepam in Sept 2018 and last cough medication with codeine was Nov 2017. I feel that she hopefully will use the controlled cough medication prescribed today appropriately and counseled her today against misuse.    Sudie Grumbling, NP 07/23/17 [phone]

## 2017-07-23 NOTE — ED Triage Notes (Signed)
Fever intermittent for a week, reports 100.5.  Today patient has a cough.  Patient has sinus drainage.  Denies sore throat

## 2017-07-23 NOTE — Discharge Instructions (Addendum)
Recommend continue Albuterol inhaler 2 puffs every 4 to 6 hours as needed for coughing and wheezing. Increase fluid intake to help loosen up any mucus. May take Tylenol 1000mg  every 8 hours as needed for fever. May use Tussionex 1 teaspoon every 12 hours as needed for cough. Follow-up in 4 to 5 days with your PCP if not improving.

## 2017-08-01 ENCOUNTER — Encounter (HOSPITAL_COMMUNITY): Payer: Self-pay | Admitting: Emergency Medicine

## 2017-08-01 ENCOUNTER — Ambulatory Visit (HOSPITAL_COMMUNITY)
Admission: EM | Admit: 2017-08-01 | Discharge: 2017-08-01 | Disposition: A | Discharge status: Home / Self Care | Payer: Self-pay | Attending: Urgent Care | Admitting: Urgent Care

## 2017-08-01 DIAGNOSIS — K0889 Other specified disorders of teeth and supporting structures: Secondary | ICD-10-CM

## 2017-08-01 DIAGNOSIS — K0381 Cracked tooth: Secondary | ICD-10-CM

## 2017-08-01 MED ORDER — CHLORHEXIDINE GLUCONATE 0.12 % MT SOLN: 15.0000 mL | Freq: Two times a day (BID) | OROMUCOSAL | 0 refills | Status: DC

## 2017-08-01 MED ORDER — LIDOCAINE VISCOUS 2 % MT SOLN: 20.0000 mL | Freq: Two times a day (BID) | OROMUCOSAL | 0 refills | Status: DC

## 2017-08-01 NOTE — ED Provider Notes (Signed)
  MRN: [id number] DOB: 11/03/1980  Subjective:   Victoria Middleton is a 37 y.o. female presenting for 2 day history of acute onset dental pain over left lower jaw. Reports having history of breaking tooth on that side but has not been able to go to a dentist due to cost burden. Reports subjective fever, swelling of the jaw. Pain is constant, sharp, radiates into left lower jaw. Has used Listerine, APAP with minimal relief.   No current facility-administered medications for this encounter.   Current Outpatient Medications:  .  chlorpheniramine-HYDROcodone (TUSSIONEX PENNKINETIC ER) 10-8 MG/5ML SUER, Take 5 mLs by mouth every 12 (twelve) hours as needed for cough., Disp: 100 mL, Rfl: 0 .  ondansetron (ZOFRAN) 4 MG tablet, Take 1 tablet (4 mg total) by mouth every 8 (eight) hours as needed for nausea or vomiting., Disp: 8 tablet, Rfl: 0   Loralee is allergic to compazine [prochlorperazine edisylate]; aripiprazole; benadryl [diphenhydramine hcl]; compazine [prochlorperazine edisylate]; ketorolac tromethamine; naprosyn [naproxen]; nsaids; penicillins; sulfa antibiotics; and toradol [ketorolac tromethamine].  [name]  has a past medical history of Depression, Drug-seeking behavior, Headache(784.0), Kidney stones, Migraine, Narcotic abuse (HCC), Seizures (HCC), and Suicide attempt (HCC). Also  has a past surgical history that includes Appendectomy; Cholecystectomy; Tonsillectomy; Knee surgery; Ankle surgery; and Anterior cervical decomp/discectomy fusion (2018).  Objective:   Vitals: BP 115/66 (BP Location: Left Arm)   Pulse 70   Temp 98.2 F (36.8 C) (Oral)   Resp 20   LMP 07/25/2017   SpO2 100%   Physical Exam  Constitutional: She is oriented to person, place, and time. She appears well-developed and well-nourished.  HENT:  Mouth/Throat: Mucous membranes are not dry. Dental caries present. No dental abscesses or lacerations. No oropharyngeal exudate, posterior oropharyngeal edema or posterior  oropharyngeal erythema.    Cardiovascular: Normal rate.  Pulmonary/Chest: Effort normal.  Neurological: She is alert and oriented to person, place, and time.   Assessment and Plan :   Pain, dental  Cracked tooth  Emphasized need for patient to seek dental care. She has allergies to NSAIDs, history of drug seeking behavior and therefore I recommended patient use APAP, viscous lidocaine. She also has allergies to amoxicillin and given reassuring physical exam findings, will use chlorhexidine mouth rinse. Return-to-clinic precautions discussed, patient verbalized understanding.     Wallis BambergMani, Sui Kasparek, PA-C 08/01/17 1250

## 2017-08-01 NOTE — ED Triage Notes (Signed)
PT C/O: left lower dental pain onset 2 days ... Reports a broken tooth  Sx also include swelling.   DENIES: fever  TAKING MEDS: Acetaminophen  A&O x4... NAD... Ambulatory

## 2017-08-01 NOTE — Discharge Instructions (Signed)
Please make sure you seek a consult with a dentist. In the meantime, schedule Tylenol at 500mg  every 6 hours for your pain. You may also use the viscous lidocaine and chlorhexidine mouth rinse. If your pain is severe, I recommend using 1,000mg  of Tylenol every 8 hours but make sure you get an appointment with a dentist.

## 2017-08-17 ENCOUNTER — Encounter (HOSPITAL_COMMUNITY): Payer: Self-pay | Admitting: *Deleted

## 2017-08-17 ENCOUNTER — Emergency Department (HOSPITAL_COMMUNITY)
Admission: EM | Admit: 2017-08-17 | Discharge: 2017-08-17 | Disposition: A | Discharge status: Home / Self Care | Payer: Self-pay | Attending: Emergency Medicine | Admitting: Emergency Medicine

## 2017-08-17 ENCOUNTER — Other Ambulatory Visit: Payer: Self-pay

## 2017-08-17 ENCOUNTER — Emergency Department (HOSPITAL_COMMUNITY): Payer: Self-pay

## 2017-08-17 DIAGNOSIS — Y999 Unspecified external cause status: Secondary | ICD-10-CM | POA: Insufficient documentation

## 2017-08-17 DIAGNOSIS — M25511 Pain in right shoulder: Secondary | ICD-10-CM | POA: Insufficient documentation

## 2017-08-17 DIAGNOSIS — W108XXA Fall (on) (from) other stairs and steps, initial encounter: Secondary | ICD-10-CM | POA: Insufficient documentation

## 2017-08-17 DIAGNOSIS — W19XXXA Unspecified fall, initial encounter: Secondary | ICD-10-CM

## 2017-08-17 DIAGNOSIS — Y92009 Unspecified place in unspecified non-institutional (private) residence as the place of occurrence of the external cause: Secondary | ICD-10-CM | POA: Insufficient documentation

## 2017-08-17 MED ORDER — OXYCODONE-ACETAMINOPHEN 5-325 MG PO TABS
1.0000 | ORAL_TABLET | ORAL | Status: DC | PRN
  Administered 2017-08-17: 1 via ORAL
  Filled 2017-08-17: qty 1

## 2017-08-17 NOTE — ED Provider Notes (Signed)
[place] EMERGENCY DEPARTMENT Provider Note   CSN: [id number] Arrival date & time: 08/17/17  1424     History   Chief Complaint Chief Complaint  Patient presents with  . Fall    HPI Victoria Middleton is a 37 y.o. female here for evaluation of right shoulder pain after mechanical fall. States she was walking into her house, missed the front step and fell forward. She does not know how she landed exactly because things happen to quickly. She denies head trauma, loss of consciousness, headache, vision changes, nausea, vomiting, walking difficulty. Pain to the shoulder is worse with palpation and movement. Has not tried any over-the-counter medications for the pain. States her hip and low back pain have resolved from the fall. No numbness or tingling to the right extremity. No previous surgeries or injuries. No anticoagulants.  HPI  Past Medical History:  Diagnosis Date  . Depression   . Drug-seeking behavior   . Headache(784.0)   . Kidney stones   . Migraine   . Narcotic abuse ([place])   . Seizures ([place])   . Suicide attempt [facility])     Patient Active Problem List   Diagnosis Date Noted  . Overdose 11/24/2016  . Elevated serum creatinine 11/24/2016  . Seizure disorder ([place]) 08/24/2016  . Intractable chronic migraine without aura and with status migrainosus 01/24/2016  . Major depressive disorder, recurrent episode, moderate ([place]) 09/13/2012    Past Surgical History:  Procedure Laterality Date  . ANKLE SURGERY     left  . ANTERIOR CERVICAL DECOMP/DISCECTOMY FUSION  2018  . APPENDECTOMY    . CHOLECYSTECTOMY    . KNEE SURGERY    . TONSILLECTOMY      OB History    Gravida Para Term Preterm AB Living   0             SAB TAB Ectopic Multiple Live Births                   Home Medications    Prior to Admission medications   Medication Sig Start Date End Date Taking? Authorizing Provider  acetaminophen (TYLENOL) 500 MG tablet Take 1,000 mg by mouth  every 6 (six) hours as needed for headache (pain).   Yes [provider]  chlorhexidine (PERIDEX) 0.12 % solution Use as directed 15 mLs in the mouth or throat 2 (two) times daily. Patient not taking: Reported on 08/17/2017 08/01/17   Jaynee Eagles, PA-C  chlorpheniramine-HYDROcodone [facility] ER) 10-8 MG/5ML SUER Take 5 mLs by mouth every 12 (twelve) hours as needed for cough. Patient not taking: Reported on 08/17/2017 07/23/17   Katy Apo, NP  lidocaine (XYLOCAINE) 2 % solution Use as directed 20 mLs in the mouth or throat 2 (two) times daily. Patient not taking: Reported on 08/17/2017 08/01/17   Jaynee Eagles, PA-C  ondansetron (ZOFRAN) 4 MG tablet Take 1 tablet (4 mg total) by mouth every 8 (eight) hours as needed for nausea or vomiting. Patient not taking: Reported on 08/17/2017 07/18/17   Schuyler Amor, MD    Family History Family History  Problem Relation Age of Onset  . Obesity Mother   . Diabetes Mother   . Hypertension Mother   . Depression Mother   . Suicidality Mother   . Diabetes Father   . Hypertension Father   . Heart attack Father     Social History Social History   Tobacco Use  . Smoking status: Never Smoker  .  Smokeless tobacco: Never Used  Substance Use Topics  . Alcohol use: Yes    Comment: Rare  . Drug use: No     Allergies   Compazine [prochlorperazine edisylate]; Aripiprazole; Benadryl [diphenhydramine hcl]; Ketorolac tromethamine; Naprosyn [naproxen]; Nsaids; Penicillins; Sulfa antibiotics; and Toradol [ketorolac tromethamine]   Review of Systems Review of Systems  Musculoskeletal: Positive for arthralgias and myalgias.  All other systems reviewed and are negative.    Physical Exam Updated Vital Signs BP (!) 164/89 (BP Location: Right Arm)   Pulse 61   Temp 98.8 F (37.1 C)   Resp 16   LMP 08/17/2017   SpO2 100%   Physical Exam  Constitutional: She is oriented to person, place, and time. She appears well-developed and  well-nourished. No distress.  NAD.  HENT:  Head: Normocephalic and atraumatic.  Right Ear: External ear normal.  Left Ear: External ear normal.  Nose: Nose normal.  Atraumatic  Eyes: Conjunctivae and EOM are normal. No scleral icterus.  Neck: Normal range of motion. Neck supple.  Cardiovascular: Normal rate, regular rhythm and normal heart sounds.  No murmur heard. Pulmonary/Chest: Effort normal and breath sounds normal. She has no wheezes.  Musculoskeletal: Normal range of motion. She exhibits tenderness. She exhibits no deformity.  Pt has diffuse tenderness to anterior and lateral shoulder with light palpation. Decreased AROM due to pain. No focal tenderness, step offs, deformity or crepitus to sternum, clavicle, AC/Pow[place] joint or scapula.   Neurological: She is alert and oriented to person, place, and time.  5/5 strength with hand grip bilaterally. Sensation to M/U/R nerve distribution intact in upper extremities.   Skin: Skin is warm and dry. Capillary refill takes less than 2 seconds.  No ecchymosis to right shoulder.   Psychiatric: She has a normal mood and affect. Her behavior is normal. Judgment and thought content normal.  Nursing note and vitals reviewed.    ED Treatments / Results  Labs (all labs ordered are listed, but only abnormal results are displayed) Labs Reviewed - No data to display  EKG  EKG Interpretation None       Radiology Dg Shoulder Right  Result Date: 08/17/2017 CLINICAL DATA:  Fall today with hip pain.  Initial encounter. EXAM: RIGHT SHOULDER - 2+ VIEW COMPARISON:  Right clavicle series 03/07/2017 FINDINGS: There is no evidence of fracture or dislocation. There is no evidence of arthropathy or other focal bone abnormality. Soft tissues are unremarkable. IMPRESSION: Negative. Electronically Signed   By: Monte Fantasia M.D.   On: 08/17/2017 16:02   Dg Hip Unilat  With Pelvis 2-3 Views Right  Result Date: 08/17/2017 CLINICAL DATA:  Fall. EXAM: DG HIP  (WITH OR WITHOUT PELVIS) 2-3V RIGHT COMPARISON:  03/07/2017. FINDINGS: Degenerative changes lumbar spine and both hips. No acute bony abnormality identified. No evidence of fracture. Surgical staples noted over the right abdomen. IMPRESSION: No acute abnormality. Electronically Signed   By: Marcello Moores  Register   On: 08/17/2017 16:02    Procedures Procedures (including critical care time)  Medications Ordered in ED Medications  oxyCODONE-acetaminophen (PERCOCET/ROXICET) 5-325 MG per tablet 1 tablet (1 tablet Oral Given 08/17/17 1535)     Initial Impression / Assessment and Plan / ED Course  I have reviewed the triage vital signs and the nursing notes.  Pertinent labs & imaging results that were available during my care of the patient were reviewed by me and considered in my medical decision making (see chart for details).    Likely MSK injury/contusion or other ligamentous  strain. X-rays negative. Pain to hip and back resolved while in the ED. Will d/c with conservative management. Case Management met with pt and will give her resources for PCP in the area. She is frequently in ED.   Final Clinical Impressions(s) / ED Diagnoses   Final diagnoses:  Fall, initial encounter  Acute pain of right shoulder    ED Discharge Orders    None       Kinnie Feil, PA-C 08/17/17 Randol Kern    Malvin Johns, MD 08/17/17 2034

## 2017-08-17 NOTE — Discharge Instructions (Signed)
X-rays are negative. Ice. Take ibuprofen 600 mg plus tylenol 1000 mg every 6-8 hours for pain. Wear your sling for 1-2 days max. Start doing range of motion exercises of shoulder to avoid stiffness and loss of strength. Follow up with orthopedist in 1-2 weeks if pain persists.

## 2017-08-17 NOTE — ED Triage Notes (Addendum)
Pt reports tripping and falling today. No loc. Has right shoulder, hip and lower back pain. Decreased rom to right shoulder.

## 2017-08-17 NOTE — Care Management (Signed)
Patient noted to have had 11 ED visits in the past 6 months with c/o pain. Patient is followed by Dr.E Sherren Mocha. Patient has been seen in several ED's and Urgent Care with similar complaints r/t pain, As per Dr.McShane's note on 07/18/17 she has had 34 different prescriptions for controlled substances within the last 2 years. ED CM met with patient and so at bedside to confirm information and discuss transitional care planning. Patient states she no longer has a PCP since losing her insurance. Discussed the [facility] for [facility] care services patient is agreeable. Provided patient with information to contact [facility] on Monday 2/4 to schedule an ED f/u appointment. Patient verbalized understanding and teach back done. Updated EDP in triage she was in agreement with transitional care plan. No further ED CM needs identified

## 2017-08-31 ENCOUNTER — Encounter (HOSPITAL_COMMUNITY): Payer: Self-pay | Admitting: Family Medicine

## 2017-08-31 ENCOUNTER — Ambulatory Visit (HOSPITAL_COMMUNITY)
Admission: EM | Admit: 2017-08-31 | Discharge: 2017-08-31 | Disposition: A | Discharge status: Home / Self Care | Payer: Self-pay | Attending: Family Medicine | Admitting: Family Medicine

## 2017-08-31 DIAGNOSIS — J069 Acute upper respiratory infection, unspecified: Secondary | ICD-10-CM

## 2017-08-31 DIAGNOSIS — B9789 Other viral agents as the cause of diseases classified elsewhere: Secondary | ICD-10-CM

## 2017-08-31 MED ORDER — PREDNISONE 20 MG PO TABS: 40.0000 mg | ORAL_TABLET | Freq: Every day | ORAL | 0 refills | Status: AC

## 2017-08-31 NOTE — ED Provider Notes (Signed)
[facility]    CSN: [id number] Arrival date & time: 08/31/17  1743     History   Chief Complaint Chief Complaint  Patient presents with  . Fever    HPI Victoria Middleton is a 37 y.o. female.   Victoria Middleton presents with complaints of cough and fever for the past two days. States for the past week she has felt generally unwell and fatigued. Mild runny nose and sore throat. without ear pain. 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 Mild nausea. Without vomiting or diarrhea. Did not get a flu vaccine this season. History of asthma, has not been using her inhaler. Denies wheezing. Hx of depression, headache, kidney stones, seizures.    ROS per HPI.       Past Medical History:  Diagnosis Date  . Depression   . Drug-seeking behavior   . Headache(784.0)   . Kidney stones   . Migraine   . Narcotic abuse ([facility])   . Seizures ([facility])   . Suicide attempt [facility])     Patient Active Problem List   Diagnosis Date Noted  . Overdose 11/24/2016  . Elevated serum creatinine 11/24/2016  . Seizure disorder ([facility]) 08/24/2016  . Intractable chronic migraine without aura and with status migrainosus 01/24/2016  . Major depressive disorder, recurrent episode, moderate ([facility]) 09/13/2012    Past Surgical History:  Procedure Laterality Date  . ANKLE SURGERY     left  . ANTERIOR CERVICAL DECOMP/DISCECTOMY FUSION  2018  . APPENDECTOMY    . CHOLECYSTECTOMY    . KNEE SURGERY    . TONSILLECTOMY      OB History    Gravida Para Term Preterm AB Living   0             SAB TAB Ectopic Multiple Live Births                   Home Medications    Prior to Admission medications   Medication Sig Start Date End Date Taking? Authorizing Provider  levETIRAcetam (KEPPRA) 1000 MG tablet Take 1,000 mg by mouth 2 (two) times daily.   Yes [provider]  acetaminophen (TYLENOL) 500 MG tablet Take 1,000 mg by mouth every 6 (six) hours as needed for headache (pain).     [provider]  predniSONE (DELTASONE) 20 MG tablet Take 2 tablets (40 mg total) by mouth daily with breakfast for 5 days. 08/31/17 09/05/17  Georgetta HaberBurky, Limmie Schoenberg B, NP    Family History Family History  Problem Relation Age of Onset  . Obesity Mother   . Diabetes Mother   . Hypertension Mother   . Depression Mother   . Suicidality Mother   . Diabetes Father   . Hypertension Father   . Heart attack Father     Social History Social History   Tobacco Use  . Smoking status: Never Smoker  . Smokeless tobacco: Never Used  Substance Use Topics  . Alcohol use: Yes    Comment: Rare  . Drug use: No     Allergies   Compazine [prochlorperazine edisylate]; Aripiprazole; Benadryl [diphenhydramine hcl]; Ketorolac tromethamine; Naprosyn [naproxen]; Nsaids; Penicillins; Sulfa antibiotics; and Toradol [ketorolac tromethamine]   Review of Systems Review of Systems   Physical Exam Triage Vital Signs ED Triage Vitals  Enc Vitals Group     BP 08/31/17 1818 (!) 153/93     Pulse Rate 08/31/17 1818 87     Resp 08/31/17 1818 18  Temp 08/31/17 1818 98.8 F (37.1 C)     Temp src --      SpO2 08/31/17 1818 97 %     Weight --      Height --      Head Circumference --      Peak Flow --      Pain Score 08/31/17 1815 5     Pain Loc --      Pain Edu? --      Excl. in GC? --    No data found.  Updated Vital Signs BP (!) 153/93   Pulse 87   Temp 98.8 F (37.1 C)   Resp 18   LMP 08/17/2017   SpO2 97%   Visual Acuity Right Eye Distance:   Left Eye Distance:   Bilateral Distance:    Right Eye Near:   Left Eye Near:    Bilateral Near:     Physical Exam  Constitutional: She is oriented to person, place, and time. She appears well-developed and well-nourished. No distress.  HENT:  Head: Normocephalic and atraumatic.  Right Ear: Tympanic membrane, external ear and ear canal normal.  Left Ear: Tympanic membrane, external ear and ear canal normal.  Nose: Nose normal.    Mouth/Throat: Uvula is midline, oropharynx is clear and moist and mucous membranes are normal. No tonsillar exudate.  Eyes: Conjunctivae and EOM are normal. Pupils are equal, round, and reactive to light.  Cardiovascular: Normal rate, regular rhythm and normal heart sounds.  Pulmonary/Chest: Effort normal and breath sounds normal. No respiratory distress. She has no wheezes. She has no rales. She exhibits no tenderness.  Frequent strong dry cough noted   Neurological: She is alert and oriented to person, place, and time.  Skin: Skin is warm and dry.     UC Treatments / Results  Labs (all labs ordered are listed, but only abnormal results are displayed) Labs Reviewed - No data to display  EKG  EKG Interpretation None       Radiology No results found.  Procedures Procedures (including critical care time)  Medications Ordered in UC Medications - No data to display   Initial Impression / Assessment and Plan / UC Course  I have reviewed the triage vital signs and the nursing notes.  Pertinent labs & imaging results that were available during my care of the patient were reviewed by me and considered in my medical decision making (see chart for details).     Strong frequent cough. Lungs clear at this time. Vitals stable. History and physical consistent with viral illness.  Supportive cares recommended. 5 days of prednisone provided. Inhaler as needed. Return precautions provided. If symptoms worsen or do not improve in the next week to return to be seen or to follow up with PCP.  Patient verbalized understanding and agreeable to plan.    Final Clinical Impressions(s) / UC Diagnoses   Final diagnoses:  Viral URI with cough    ED Discharge Orders        Ordered    predniSONE (DELTASONE) 20 MG tablet  Daily with breakfast     08/31/17 1846       Controlled Substance Prescriptions [place] Controlled Substance Registry consulted? Not Applicable   Georgetta Haber,  NP 08/31/17 [phone]

## 2017-08-31 NOTE — ED Triage Notes (Signed)
Pt here for fever and body aches x 1 week and now she is coughing. Taking tylenol

## 2017-08-31 NOTE — Discharge Instructions (Signed)
Push fluids to ensure adequate hydration and keep secretions thin.  Tylenol as needed for pain or fevers.  5 days of prednisone provided. Inhaler as needed for wheezing or shortness of breath. Mucinex or mucinex d as an expectorant to loosen secretions. May use cough drops or other over the counter treatments as needed for symptoms. If symptoms worsen or do not improve in the next week to return to be seen or to follow up with PCP.

## 2017-09-03 ENCOUNTER — Other Ambulatory Visit: Payer: Self-pay

## 2017-09-03 ENCOUNTER — Ambulatory Visit (INDEPENDENT_AMBULATORY_CARE_PROVIDER_SITE_OTHER): Payer: Self-pay

## 2017-09-03 ENCOUNTER — Ambulatory Visit (HOSPITAL_COMMUNITY)
Admission: EM | Admit: 2017-09-03 | Discharge: 2017-09-03 | Disposition: A | Discharge status: Home / Self Care | Payer: Self-pay | Attending: Family Medicine | Admitting: Family Medicine

## 2017-09-03 ENCOUNTER — Encounter (HOSPITAL_COMMUNITY): Payer: Self-pay | Admitting: Emergency Medicine

## 2017-09-03 DIAGNOSIS — R05 Cough: Secondary | ICD-10-CM

## 2017-09-03 DIAGNOSIS — R0602 Shortness of breath: Secondary | ICD-10-CM

## 2017-09-03 DIAGNOSIS — J22 Unspecified acute lower respiratory infection: Secondary | ICD-10-CM

## 2017-09-03 MED ORDER — AZITHROMYCIN 250 MG PO TABS: ORAL_TABLET | ORAL | 0 refills | Status: DC

## 2017-09-03 MED ORDER — IPRATROPIUM-ALBUTEROL 0.5-2.5 (3) MG/3ML IN SOLN
RESPIRATORY_TRACT | Status: AC
  Filled 2017-09-03: qty 3

## 2017-09-03 MED ORDER — IPRATROPIUM-ALBUTEROL 0.5-2.5 (3) MG/3ML IN SOLN
3.0000 mL | Freq: Once | RESPIRATORY_TRACT | Status: AC
Start: 2017-09-03 — End: 2017-09-03
  Administered 2017-09-03: 3 mL via RESPIRATORY_TRACT

## 2017-09-03 NOTE — Discharge Instructions (Signed)
Complete course of prednisone as previously prescribed. Complete course of antibiotics.   Use of albuterol inhaler as needed for wheezing. Tylenol as needed for pain and/or fevers. Diet as tolerated, increase fluid intake. If you develop worsening of symptoms, increased shortness of breath, chest pain, fevers or otherwise not improving in the next week return to be seen or follow up with your primary care provider. Cough may persist as a lingering symptom for up to 2 weeks.

## 2017-09-03 NOTE — ED Provider Notes (Signed)
[facility]    CSN: [id number] Arrival date & time: 09/03/17  1302     History   Chief Complaint Chief Complaint  Patient presents with  . Shortness of Breath    HPI Victoria Middleton is a 37 y.o. female.   Victoria Middleton presents with complaints of cough and fever which is worsening. Was seen 2/15, started on prednisone but symptoms have worsened. TMAX of 103. Taking tylenol which has helped, last dose 3 hours ago. Mild runny nose and sore throat. without ear pain. 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 Mild nausea. Vomited x1 PTA. Did not get a flu vaccine this season. History of asthma, has been using inhaler, last use prior to arrival. endorses wheezing. Feels dizzy and lightheaded at times. Has also been taking mucinex.  Hx of depression, headache, kidney stones, seizures.   ROS per HPI.       Past Medical History:  Diagnosis Date  . Depression   . Drug-seeking behavior   . Headache(784.0)   . Kidney stones   . Migraine   . Narcotic abuse ([facility])   . Seizures ([facility])   . Suicide attempt [facility])     Patient Active Problem List   Diagnosis Date Noted  . Overdose 11/24/2016  . Elevated serum creatinine 11/24/2016  . Seizure disorder ([facility]) 08/24/2016  . Intractable chronic migraine without aura and with status migrainosus 01/24/2016  . Major depressive disorder, recurrent episode, moderate ([facility]) 09/13/2012    Past Surgical History:  Procedure Laterality Date  . ANKLE SURGERY     left  . ANTERIOR CERVICAL DECOMP/DISCECTOMY FUSION  2018  . APPENDECTOMY    . CHOLECYSTECTOMY    . KNEE SURGERY    . TONSILLECTOMY      OB History    Gravida Para Term Preterm AB Living   0             SAB TAB Ectopic Multiple Live Births                   Home Medications    Prior to Admission medications   Medication Sig Start Date End Date Taking? Authorizing Provider  acetaminophen (TYLENOL) 500 MG tablet Take 1,000 mg by mouth every 6 (six) hours as  needed for headache (pain).    [provider]  azithromycin (ZITHROMAX) 250 MG tablet Take first 2 tablets together, then 1 every day until finished. 09/03/17   Georgetta Haber, NP  levETIRAcetam (KEPPRA) 1000 MG tablet Take 1,000 mg by mouth 2 (two) times daily.    [provider]  predniSONE (DELTASONE) 20 MG tablet Take 2 tablets (40 mg total) by mouth daily with breakfast for 5 days. 08/31/17 09/05/17  Georgetta Haber, NP    Family History Family History  Problem Relation Age of Onset  . Obesity Mother   . Diabetes Mother   . Hypertension Mother   . Depression Mother   . Suicidality Mother   . Diabetes Father   . Hypertension Father   . Heart attack Father     Social History Social History   Tobacco Use  . Smoking status: Never Smoker  . Smokeless tobacco: Never Used  Substance Use Topics  . Alcohol use: Yes    Comment: Rare  . Drug use: No     Allergies   Compazine [prochlorperazine edisylate]; Aripiprazole; Benadryl [diphenhydramine hcl]; Ketorolac tromethamine; Naprosyn [naproxen]; Nsaids; Penicillins; Sulfa antibiotics; and Toradol [ketorolac tromethamine]  Review of Systems Review of Systems   Physical Exam Triage Vital Signs ED Triage Vitals  Enc Vitals Group     BP 09/03/17 1450 (!) 127/92     Pulse Rate 09/03/17 1450 81     Resp 09/03/17 1450 (!) 24     Temp 09/03/17 1450 98.9 F (37.2 C)     Temp Source 09/03/17 1450 Oral     SpO2 09/03/17 1450 100 %     Weight --      Height --      Head Circumference --      Peak Flow --      Pain Score 09/03/17 1449 7     Pain Loc --      Pain Edu? --      Excl. in GC? --    No data found.  Updated Vital Signs BP (!) 127/92   Pulse 81   Temp 98.9 F (37.2 C) (Oral)   Resp (!) 24   LMP 08/17/2017   SpO2 100%   Visual Acuity Right Eye Distance:   Left Eye Distance:   Bilateral Distance:    Right Eye Near:   Left Eye Near:    Bilateral Near:     Physical Exam    Constitutional: She is oriented to person, place, and time. She appears well-developed and well-nourished. No distress.  HENT:  Head: Normocephalic and atraumatic.  Right Ear: Tympanic membrane, external ear and ear canal normal.  Left Ear: Tympanic membrane, external ear and ear canal normal.  Nose: Nose normal.  Mouth/Throat: Uvula is midline, oropharynx is clear and moist and mucous membranes are normal. No tonsillar exudate.  Eyes: Conjunctivae and EOM are normal. Pupils are equal, round, and reactive to light.  Cardiovascular: Normal rate, regular rhythm and normal heart sounds.  Pulmonary/Chest: Effort normal and breath sounds normal. Tachypnea noted. She has no wheezes.  Upper airway wheezing noted but without wheezing to lung fields on auscultation; strong frequent dry cough noted  Neurological: She is alert and oriented to person, place, and time.  Skin: Skin is warm and dry.     UC Treatments / Results  Labs (all labs ordered are listed, but only abnormal results are displayed) Labs Reviewed - No data to display  EKG  EKG Interpretation None       Radiology Dg Chest 2 View  Result Date: 09/03/2017 CLINICAL DATA:  Cough, fever and shortness of breath for 3 days. EXAM: CHEST  2 VIEW COMPARISON:  PA and lateral chest 10/22/2016 and 07/02/2017. CT chest 10/23/2016 FINDINGS: Small focus of linear atelectasis or scar in the lingula is unchanged since the most recent exam. The lungs are otherwise clear. Heart size is normal. No pneumothorax or pleural effusion. No bony abnormality. IMPRESSION: Negative chest. Electronically Signed   By: Drusilla Kanner M.D.   On: 09/03/2017 15:18    Procedures Procedures (including critical care time)  Medications Ordered in UC Medications  ipratropium-albuterol (DUONEB) 0.5-2.5 (3) MG/3ML nebulizer solution 3 mL (3 mLs Nebulization Given 09/03/17 1502)     Initial Impression / Assessment and Plan / UC Course  I have reviewed the  triage vital signs and the nursing notes.  Pertinent labs & imaging results that were available during my care of the patient were reviewed by me and considered in my medical decision making (see chart for details).     Mild tachypnea noted. Improved with duoneb. Patient states she has duoneb and inhalers at home. Still taking prednisone.  Chest xray without acute findings at this time. Afebrile. Without tachycardia. Frequent cough noted. Azithromycin provided for empiric coverage. Return precautions provided. Patient verbalized understanding and agreeable to plan.  Ambulatory out of clinic without difficulty.    Final Clinical Impressions(s) / UC Diagnoses   Final diagnoses:  Lower respiratory infection    ED Discharge Orders        Ordered    azithromycin (ZITHROMAX) 250 MG tablet     09/03/17 1524       Controlled Substance Prescriptions [place] Controlled Substance Registry consulted? Not Applicable   Georgetta HaberBurky, Rahul Malinak B, NP 09/03/17 [id number]

## 2017-09-03 NOTE — ED Triage Notes (Signed)
PT was seen here Friday and has gotten worse

## 2017-09-03 NOTE — ED Triage Notes (Signed)
Cough, SOB, wheezing, fever that started Friday.

## 2017-09-04 ENCOUNTER — Emergency Department (HOSPITAL_COMMUNITY)
Admission: EM | Admit: 2017-09-04 | Discharge: 2017-09-04 | Disposition: A | Discharge status: Home / Self Care | Payer: Self-pay | Attending: Emergency Medicine | Admitting: Emergency Medicine

## 2017-09-04 ENCOUNTER — Encounter (HOSPITAL_COMMUNITY): Payer: Self-pay | Admitting: Emergency Medicine

## 2017-09-04 ENCOUNTER — Emergency Department (HOSPITAL_COMMUNITY): Payer: Self-pay

## 2017-09-04 DIAGNOSIS — J45909 Unspecified asthma, uncomplicated: Secondary | ICD-10-CM | POA: Insufficient documentation

## 2017-09-04 DIAGNOSIS — R0602 Shortness of breath: Secondary | ICD-10-CM | POA: Insufficient documentation

## 2017-09-04 DIAGNOSIS — R05 Cough: Secondary | ICD-10-CM | POA: Insufficient documentation

## 2017-09-04 DIAGNOSIS — R6889 Other general symptoms and signs: Secondary | ICD-10-CM

## 2017-09-04 DIAGNOSIS — J3489 Other specified disorders of nose and nasal sinuses: Secondary | ICD-10-CM | POA: Insufficient documentation

## 2017-09-04 DIAGNOSIS — Z79899 Other long term (current) drug therapy: Secondary | ICD-10-CM | POA: Insufficient documentation

## 2017-09-04 HISTORY — DX: Unspecified asthma, uncomplicated: J45.909

## 2017-09-04 LAB — BASIC METABOLIC PANEL
Anion gap: 13 (ref 5–15)
BUN: 18 mg/dL (ref 6–20)
CALCIUM: 9 mg/dL (ref 8.9–10.3)
CO2: 20 mmol/L — ABNORMAL LOW (ref 22–32)
CREATININE: 1.01 mg/dL — AB (ref 0.44–1.00)
Chloride: 107 mmol/L (ref 101–111)
GLUCOSE: 106 mg/dL — AB (ref 65–99)
Potassium: 3 mmol/L — ABNORMAL LOW (ref 3.5–5.1)
Sodium: 140 mmol/L (ref 135–145)

## 2017-09-04 LAB — CBC WITH DIFFERENTIAL/PLATELET
BASOS ABS: 0 10*3/uL (ref 0.0–0.1)
BASOS PCT: 0 %
EOS PCT: 0 %
Eosinophils Absolute: 0 10*3/uL (ref 0.0–0.7)
HCT: 38.1 % (ref 36.0–46.0)
Hemoglobin: 12.7 g/dL (ref 12.0–15.0)
Lymphocytes Relative: 12 %
Lymphs Abs: 1.5 10*3/uL (ref 0.7–4.0)
MCH: 28.4 pg (ref 26.0–34.0)
MCHC: 33.3 g/dL (ref 30.0–36.0)
MCV: 85.2 fL (ref 78.0–100.0)
MONO ABS: 1.1 10*3/uL — AB (ref 0.1–1.0)
MONOS PCT: 9 %
Neutro Abs: 10.1 10*3/uL — ABNORMAL HIGH (ref 1.7–7.7)
Neutrophils Relative %: 79 %
PLATELETS: 221 10*3/uL (ref 150–400)
RBC: 4.47 MIL/uL (ref 3.87–5.11)
RDW: 13.3 % (ref 11.5–15.5)
WBC: 12.6 10*3/uL — ABNORMAL HIGH (ref 4.0–10.5)

## 2017-09-04 MED ORDER — SODIUM CHLORIDE 0.9 % IV BOLUS (SEPSIS)
1000.0000 mL | Freq: Once | INTRAVENOUS | Status: AC
  Administered 2017-09-04: 1000 mL via INTRAVENOUS

## 2017-09-04 MED ORDER — BENZONATATE 100 MG PO CAPS: 100.0000 mg | ORAL_CAPSULE | Freq: Three times a day (TID) | ORAL | 0 refills | Status: DC

## 2017-09-04 MED ORDER — SODIUM CHLORIDE 0.9 % IV SOLN
INTRAVENOUS | Status: DC
  Administered 2017-09-04: 20 mL/h via INTRAVENOUS

## 2017-09-04 MED ORDER — METHYLPREDNISOLONE SODIUM SUCC 125 MG IJ SOLR
125.0000 mg | Freq: Once | INTRAMUSCULAR | Status: AC
  Administered 2017-09-04: 125 mg via INTRAVENOUS
  Filled 2017-09-04: qty 2

## 2017-09-04 NOTE — ED Provider Notes (Signed)
Patient placed in Quick Look pathway, seen and evaluated   Chief Complaint: cough,sob  HPI:   Pt here with 5 days of cough and fever Seen at urgent care last week and dx w/ bronchitis Placed on presnidone Seen again yesterday and had neg cxr and placed on zpak  ROS: no emesis (one)  Physical Exam:   Gen: No distress  Neuro: Awake and Alert  Skin: Warm    Focused Exam: exp wheezes on lung exam   Initiation of care has begun. The patient has been counseled on the process, plan, and necessity for staying for the completion/evaluation, and the remainder of the medical screening examination   Lorre NickAllen, Naomia Lenderman, MD 09/04/17 1249

## 2017-09-04 NOTE — ED Triage Notes (Signed)
Per EMS pt from UC where had chest xray and told prob PNA. Patient had URI symptoms for week, tried treating at home but having SOB. Albuterol 5mg  atrovent 500mcg both x2.

## 2017-09-04 NOTE — ED Notes (Signed)
Opened chart to write work note 

## 2017-09-04 NOTE — ED Provider Notes (Signed)
[facility]-EMERGENCY DEPT Provider Note  CSN: [id number] Arrival date & time: 09/04/17 1224  Chief Complaint(s) URI  HPI [facility] is a 37 y.o. female with a history of asthma who presents to the emergency department with 5 days of reported fevers, chills, rhinorrhea, nasal congestion, productive cough.  Symptoms have been ongoing and gradually worsening since onset.  Reports that she had an asthma exacerbation earlier today which did not improve with her home DuoNeb.  EMS was called who gave the patient 2 rounds of DuoNeb's resulting in significant improvement of her wheezing and shortness of breath.  Reports that she currently feels "a lot better."  Patient has been seen by urgent care on Friday and yesterday.  Initially she was given prednisone and yesterday was put on Z-Pak course.  She obtain a chest x-ray that was negative for pneumonia.  Patient was seen today during the quick look process and a second x-ray was obtained which did not reveal evidence of pneumonia.  She denies any vomiting, abdominal pain, diarrhea.  Patient denies any other physical complaints at this time.   Patient is requesting cough medicine.  On review of systems patient is noted to have drug-seeking behavior, narcotic abuse and overdose. HPI  Past Medical History Past Medical History:  Diagnosis Date  . Asthma   . Depression   . Drug-seeking behavior   . Headache(784.0)   . Kidney stones   . Migraine   . Narcotic abuse (HCC)   . Seizures (HCC)   . Suicide attempt [facility])    Patient Active Problem List   Diagnosis Date Noted  . Overdose 11/24/2016  . Elevated serum creatinine 11/24/2016  . Seizure disorder (HCC) 08/24/2016  . Intractable chronic migraine without aura and with status migrainosus 01/24/2016  . Major depressive disorder, recurrent episode, moderate (HCC) 09/13/2012   Home Medication(s) Prior to Admission medications   Medication Sig Start Date End Date Taking?  Authorizing Provider  acetaminophen (TYLENOL) 500 MG tablet Take 1,000 mg by mouth every 4 (four) hours as needed for headache (pain).    Yes [provider]  azithromycin (ZITHROMAX) 250 MG tablet Take first 2 tablets together, then 1 every day until finished. 09/03/17  Yes Burky, Dorene Grebe B, NP  levETIRAcetam (KEPPRA) 1000 MG tablet Take 1,000 mg by mouth 2 (two) times daily.   Yes [provider]  predniSONE (DELTASONE) 20 MG tablet Take 2 tablets (40 mg total) by mouth daily with breakfast for 5 days. 08/31/17 09/05/17 Yes Burky, Barron Alvine, NP  benzonatate (TESSALON) 100 MG capsule Take 1 capsule (100 mg total) by mouth every 8 (eight) hours. 09/04/17   Nira Conn, MD                                                                                                                                    Past Surgical History Past Surgical History:  Procedure Laterality  Date  . ANKLE SURGERY     left  . ANTERIOR CERVICAL DECOMP/DISCECTOMY FUSION  2018  . APPENDECTOMY    . CHOLECYSTECTOMY    . KNEE SURGERY    . TONSILLECTOMY     Family History Family History  Problem Relation Age of Onset  . Obesity Mother   . Diabetes Mother   . Hypertension Mother   . Depression Mother   . Suicidality Mother   . Diabetes Father   . Hypertension Father   . Heart attack Father     Social History Social History   Tobacco Use  . Smoking status: Never Smoker  . Smokeless tobacco: Never Used  Substance Use Topics  . Alcohol use: Yes    Comment: Rare  . Drug use: No   Allergies Compazine [prochlorperazine edisylate]; Sulfa antibiotics; Aripiprazole; Benadryl [diphenhydramine hcl]; Ketorolac tromethamine; Naprosyn [naproxen]; Nsaids; Penicillins; and Toradol [ketorolac tromethamine]  Review of Systems Review of Systems All other systems are reviewed and are negative for acute change except as noted in the HPI  Physical Exam Vital Signs  I have reviewed the triage vital  signs BP 106/81 (BP Location: Right Wrist)   Pulse 97   Temp 98.9 F (37.2 C) (Oral)   Resp 18   LMP 08/17/2017   SpO2 98%   Physical Exam  Constitutional: She is oriented to person, place, and time. She appears well-developed and well-nourished. No distress.  HENT:  Head: Normocephalic and atraumatic.  Nose: Rhinorrhea present.  Mouth/Throat: No oropharyngeal exudate, posterior oropharyngeal edema, posterior oropharyngeal erythema or tonsillar abscesses. No tonsillar exudate.  Postnasal drip  Eyes: Conjunctivae and EOM are normal. Pupils are equal, round, and reactive to light. Right eye exhibits no discharge. Left eye exhibits no discharge. No scleral icterus.  Neck: Normal range of motion. Neck supple.  Cardiovascular: Normal rate and regular rhythm. Exam reveals no gallop and no friction rub.  No murmur heard. Pulmonary/Chest: Effort normal and breath sounds normal. No accessory muscle usage or stridor. No tachypnea. No respiratory distress. She has no wheezes. She has no rhonchi. She has no rales.  Abdominal: Soft. She exhibits no distension. There is no tenderness.  Musculoskeletal: She exhibits no edema or tenderness.  Neurological: She is alert and oriented to person, place, and time.  Skin: Skin is warm and dry. No rash noted. She is not diaphoretic. No erythema.  Psychiatric: She has a normal mood and affect.  Vitals reviewed.   ED Results and Treatments Labs (all labs ordered are listed, but only abnormal results are displayed) Labs Reviewed  CBC WITH DIFFERENTIAL/PLATELET - Abnormal; Notable for the following components:      Result Value   WBC 12.6 (*)    Neutro Abs 10.1 (*)    Monocytes Absolute 1.1 (*)    All other components within normal limits  BASIC METABOLIC PANEL - Abnormal; Notable for the following components:   Potassium 3.0 (*)    CO2 20 (*)    Glucose, Bld 106 (*)    Creatinine, Ser 1.01 (*)    All other components within normal limits  EKG  EKG Interpretation  Date/Time:    Ventricular Rate:    PR Interval:    QRS Duration:   QT Interval:    QTC Calculation:   R Axis:     Text Interpretation:        Radiology Dg Chest 2 View  Result Date: 09/04/2017 CLINICAL DATA:  Cough and fever EXAM: CHEST  2 VIEW COMPARISON:  September 03, 2017 FINDINGS: There is no edema or consolidation. There is slight lingular atelectasis. Heart size and pulmonary vascularity are normal. No adenopathy. No evident bone lesions. There is postoperative change in the lower cervical spine. IMPRESSION: Mild lingular atelectasis. No edema or consolidation. Heart size within normal limits. Electronically Signed   By: Bretta Bang III M.D.   On: 09/04/2017 14:52   Pertinent labs & imaging results that were available during my care of the patient were reviewed by me and considered in my medical decision making (see chart for details).  Medications Ordered in ED Medications  0.9 %  sodium chloride infusion ( Intravenous Stopped 09/04/17 1748)  sodium chloride 0.9 % bolus 1,000 mL (0 mLs Intravenous Stopped 09/04/17 1613)  methylPREDNISolone sodium succinate (SOLU-MEDROL) 125 mg/2 mL injection 125 mg (125 mg Intravenous Given 09/04/17 1511)                                                                                                                                    Procedures Procedures  (including critical care time)  Medical Decision Making / ED Course I have reviewed the nursing notes for this encounter and the patient's prior records (if available in EHR or on provided paperwork).    37 y.o. female presents with flu-like symptoms for 5 days.  Adequate oral hydration. Rest of history as above.  Patient appears well. No signs of toxicity, patient is interactive. No hypoxia, tachypnea or other signs of respiratory distress. No sign  of clinical dehydration. Lung exam clear. Rest of exam as above.  CXR w/o PNA.  Most consistent with viral illness   No evidence suggestive of pharyngitis, AOM, PNA, or meningitis.  Discussed symptomatic treatment with the patient and they will follow closely with their PCP.     Final Clinical Impression(s) / ED Diagnoses Final diagnoses:  SOB (shortness of breath)  Flu-like symptoms   Disposition: Discharge  Condition: Good  I have discussed the results, Dx and Tx plan with the patient who expressed understanding and agree(s) with the plan. Discharge instructions discussed at great length. The patient was given strict return precautions who verbalized understanding of the instructions. No further questions at time of discharge.    ED Discharge Orders        Ordered    benzonatate (TESSALON) 100 MG capsule  Every 8 hours     09/04/17 1745       Follow Up: Primary care provider   If you do not have a primary care  physician, contact [facility] at [phone] for referral      This chart was dictated using voice recognition software.  Despite best efforts to proofread,  errors can occur which can change the documentation meaning.   Nira Connardama, Laneshia Pina Eduardo, MD 09/04/17 1750

## 2017-09-04 NOTE — ED Notes (Signed)
Bed: WA16 Expected date:  Expected time:  Means of arrival:  Comments: Hold for triage 3 

## 2017-09-04 NOTE — Discharge Instructions (Signed)
You may take over-the-counter medicine for symptomatic relief, such as Tylenol, Motrin, TheraFlu, Alka seltzer , black elderberry, etc. Please limit acetaminophen (Tylenol) to 4000 mg and Ibuprofen (Motrin, Advil, etc.) to 2400 mg for a 24hr period. Please note that other over-the-counter medicine may contain acetaminophen or ibuprofen as a component of their ingredients.   

## 2017-10-12 ENCOUNTER — Emergency Department (HOSPITAL_COMMUNITY): Admission: EM | Admit: 2017-10-12 | Discharge: 2017-10-12 | Discharge status: Against Medical Advice | Payer: Self-pay

## 2017-10-12 NOTE — ED Notes (Signed)
Pt stated she wants to leave when this nurse talked to her in triage. Pt walked out.

## 2017-10-12 NOTE — ED Triage Notes (Signed)
Per EMS, pt was found face down in grass. Pt complains of left knee pain. Pt states she had an argument with her husband and is anxious. Pt would not answer further questions from EMS. Pt's left leg is splinted. No bruising or deformity noted.   BP 131/110 HR 76 RR 18

## 2017-10-13 ENCOUNTER — Other Ambulatory Visit: Payer: Self-pay

## 2017-10-13 ENCOUNTER — Encounter (HOSPITAL_COMMUNITY): Payer: Self-pay | Admitting: Emergency Medicine

## 2017-10-13 ENCOUNTER — Emergency Department (HOSPITAL_COMMUNITY)
Admission: EM | Admit: 2017-10-13 | Discharge: 2017-10-14 | Disposition: A | Discharge status: Home / Self Care | Payer: Self-pay | Attending: Emergency Medicine | Admitting: Emergency Medicine

## 2017-10-13 DIAGNOSIS — Z79899 Other long term (current) drug therapy: Secondary | ICD-10-CM | POA: Insufficient documentation

## 2017-10-13 DIAGNOSIS — Y929 Unspecified place or not applicable: Secondary | ICD-10-CM | POA: Insufficient documentation

## 2017-10-13 DIAGNOSIS — R55 Syncope and collapse: Secondary | ICD-10-CM

## 2017-10-13 DIAGNOSIS — Y999 Unspecified external cause status: Secondary | ICD-10-CM | POA: Insufficient documentation

## 2017-10-13 DIAGNOSIS — J45909 Unspecified asthma, uncomplicated: Secondary | ICD-10-CM | POA: Insufficient documentation

## 2017-10-13 DIAGNOSIS — W0110XA Fall on same level from slipping, tripping and stumbling with subsequent striking against unspecified object, initial encounter: Secondary | ICD-10-CM | POA: Insufficient documentation

## 2017-10-13 DIAGNOSIS — G43809 Other migraine, not intractable, without status migrainosus: Secondary | ICD-10-CM | POA: Insufficient documentation

## 2017-10-13 DIAGNOSIS — S0990XA Unspecified injury of head, initial encounter: Secondary | ICD-10-CM | POA: Insufficient documentation

## 2017-10-13 DIAGNOSIS — Y9301 Activity, walking, marching and hiking: Secondary | ICD-10-CM | POA: Insufficient documentation

## 2017-10-13 MED ORDER — MORPHINE SULFATE (PF) 4 MG/ML IV SOLN
4.0000 mg | Freq: Once | INTRAVENOUS | Status: AC
  Administered 2017-10-13: 4 mg via INTRAVENOUS
  Filled 2017-10-13: qty 1

## 2017-10-13 MED ORDER — SODIUM CHLORIDE 0.9 % IV BOLUS
1000.0000 mL | Freq: Once | INTRAVENOUS | Status: AC
  Administered 2017-10-13: 1000 mL via INTRAVENOUS

## 2017-10-13 MED ORDER — METOCLOPRAMIDE HCL 5 MG/ML IJ SOLN
10.0000 mg | Freq: Once | INTRAMUSCULAR | Status: AC
  Administered 2017-10-13: 10 mg via INTRAVENOUS
  Filled 2017-10-13: qty 2

## 2017-10-13 NOTE — ED Provider Notes (Signed)
[facility] EMERGENCY DEPARTMENT Provider Note   CSN: [id number] Arrival date & time: 10/13/17  1940     History   Chief Complaint Chief Complaint  Patient presents with  . Fall    HPI [facility] is a 37 y.o. female.  Patient presents to the emergency department for evaluation of headache.  Patient reports that she had a syncopal episode yesterday.  This apparently occurred while she was alone, taking a walk.  She reports that she does not remember what happened, only remembers waking up on the ground.  She had a large contusion on her forehead noted.  She reports that she was brought to [facility] yesterday, but left before getting any testing or treatment.  Since then she has had a progressively worsening headache.  Pain is on the right side of her head and radiates from behind the right eye all the way down to the right side of her neck.  No upper extremity weakness, numbness or tingling.  She has severe light sensitivity.  She does have a history of migraines, but has never had a right-sided migraine before.     Past Medical History:  Diagnosis Date  . Asthma   . Depression   . Drug-seeking behavior   . Headache(784.0)   . Kidney stones   . Migraine   . Narcotic abuse (HCC)   . Seizures (HCC)   . Suicide attempt [facility])     Patient Active Problem List   Diagnosis Date Noted  . Overdose 11/24/2016  . Elevated serum creatinine 11/24/2016  . Seizure disorder (HCC) 08/24/2016  . Intractable chronic migraine without aura and with status migrainosus 01/24/2016  . Major depressive disorder, recurrent episode, moderate (HCC) 09/13/2012    Past Surgical History:  Procedure Laterality Date  . ANKLE SURGERY     left  . ANTERIOR CERVICAL DECOMP/DISCECTOMY FUSION  2018  . APPENDECTOMY    . CHOLECYSTECTOMY    . KNEE SURGERY    . TONSILLECTOMY       OB History    Gravida  0   Para      Term      Preterm      AB      Living        SAB      TAB      Ectopic      Multiple      Live Births               Home Medications    Prior to Admission medications   Medication Sig Start Date End Date Taking? Authorizing Provider  acetaminophen (TYLENOL) 500 MG tablet Take 1,000 mg by mouth every 4 (four) hours as needed for headache (pain).     [provider]  azithromycin (ZITHROMAX) 250 MG tablet Take first 2 tablets together, then 1 every day until finished. 09/03/17   Georgetta Haber, NP  benzonatate (TESSALON) 100 MG capsule Take 1 capsule (100 mg total) by mouth every 8 (eight) hours. 09/04/17   Nira Conn, MD  levETIRAcetam (KEPPRA) 1000 MG tablet Take 1,000 mg by mouth 2 (two) times daily.    [provider]    Family History Family History  Problem Relation Age of Onset  . Obesity Mother   . Diabetes Mother   . Hypertension Mother   . Depression Mother   . Suicidality Mother   . Diabetes Father   . Hypertension Father   .  Heart attack Father     Social History Social History   Tobacco Use  . Smoking status: Never Smoker  . Smokeless tobacco: Never Used  Substance Use Topics  . Alcohol use: Yes    Comment: Rare  . Drug use: No     Allergies   Compazine [prochlorperazine edisylate]; Sulfa antibiotics; Aripiprazole; Benadryl [diphenhydramine hcl]; Ketorolac tromethamine; Naprosyn [naproxen]; Nsaids; Penicillins; and Toradol [ketorolac tromethamine]   Review of Systems Review of Systems  Neurological: Positive for syncope and headaches.  All other systems reviewed and are negative.    Physical Exam Updated Vital Signs BP 122/74   Pulse (!) 57   Temp 98.2 F (36.8 C) (Oral)   Resp 16   LMP 10/08/2017   SpO2 97%   Physical Exam  Constitutional: She is oriented to person, place, and time. She appears well-developed and well-nourished. No distress.  HENT:  Head: Normocephalic. Head is with contusion.  Right Ear: Hearing normal.  Left Ear: Hearing  normal.  Nose: Nose normal.  Mouth/Throat: Oropharynx is clear and moist and mucous membranes are normal.  Small contusion central forehead  Eyes: Pupils are equal, round, and reactive to light. Conjunctivae and EOM are normal.  Neck: Normal range of motion. Neck supple.  Cardiovascular: Regular rhythm, S1 normal and S2 normal. Exam reveals no gallop and no friction rub.  No murmur heard. Pulmonary/Chest: Effort normal and breath sounds normal. No respiratory distress. She exhibits no tenderness.  Abdominal: Soft. Normal appearance and bowel sounds are normal. There is no hepatosplenomegaly. There is no tenderness. There is no rebound, no guarding, no tenderness at McBurney's point and negative Murphy's sign. No hernia.  Musculoskeletal: Normal range of motion.  Neurological: She is alert and oriented to person, place, and time. She has normal strength. No cranial nerve deficit or sensory deficit. Coordination normal. GCS eye subscore is 4. GCS verbal subscore is 5. GCS motor subscore is 6.  Skin: Skin is warm, dry and intact. No rash noted. No cyanosis.  Psychiatric: She has a normal mood and affect. Her speech is normal and behavior is normal. Thought content normal.  Nursing note and vitals reviewed.    ED Treatments / Results  Labs (all labs ordered are listed, but only abnormal results are displayed) Labs Reviewed  CBC - Abnormal; Notable for the following components:      Result Value   Hemoglobin 11.7 (*)    All other components within normal limits  BASIC METABOLIC PANEL - Abnormal; Notable for the following components:   CO2 20 (*)    Glucose, Bld 101 (*)    Creatinine, Ser 1.01 (*)    Calcium 8.7 (*)    All other components within normal limits  I-STAT BETA HCG BLOOD, ED ([facility], [facility], [facility] ONLY)    EKG EKG Interpretation  Date/Time:  Saturday October 13 2017 19:46:08 EDT Ventricular Rate:  74 PR Interval:  128 QRS Duration: 72 QT Interval:  370 QTC Calculation: 410 R  Axis:   -7 Text Interpretation:  Normal sinus rhythm Low voltage QRS Cannot rule out Anterior infarct , age undetermined T wave abnormality, consider lateral ischemia Abnormal ECG No significant change since last tracing Confirmed by Gilda Crease [phone]) on 10/13/2017 11:21:12 PM   Radiology Ct Head Wo Contrast  Result Date: 10/14/2017 CLINICAL DATA:  37 year old female with head trauma. EXAM: CT HEAD WITHOUT CONTRAST CT CERVICAL SPINE WITHOUT CONTRAST TECHNIQUE: Multidetector CT imaging of the head and cervical spine was performed following the  standard protocol without intravenous contrast. Multiplanar CT image reconstructions of the cervical spine were also generated. COMPARISON:  CT of the head and cervical spine dated 03/07/2017 FINDINGS: CT HEAD FINDINGS Brain: No evidence of acute infarction, hemorrhage, hydrocephalus, extra-axial collection or mass lesion/mass effect. Vascular: No hyperdense vessel or unexpected calcification. Skull: Normal. Negative for fracture or focal lesion. Sinuses/Orbits: There is mild diffuse mucoperiosteal thickening of paranasal sinuses. No air-fluid levels. The mastoid air cells are clear. Other: None CT CERVICAL SPINE FINDINGS Alignment: No acute subluxation. Skull base and vertebrae: No acute fracture. Soft tissues and spinal canal: No prevertebral fluid or swelling. No visible canal hematoma. Disc levels:  Anterior fusion at C5-C6 Upper chest: Negative. Other: Negative. IMPRESSION: 1. Unremarkable noncontrast CT of the brain. 2. No acute/traumatic cervical spine pathology. Electronically Signed   By: Elgie CollardArash  Radparvar M.D.   On: 10/14/2017 01:06   Ct Cervical Spine Wo Contrast  Result Date: 10/14/2017 CLINICAL DATA:  37 year old female with head trauma. EXAM: CT HEAD WITHOUT CONTRAST CT CERVICAL SPINE WITHOUT CONTRAST TECHNIQUE: Multidetector CT imaging of the head and cervical spine was performed following the standard protocol without intravenous contrast.  Multiplanar CT image reconstructions of the cervical spine were also generated. COMPARISON:  CT of the head and cervical spine dated 03/07/2017 FINDINGS: CT HEAD FINDINGS Brain: No evidence of acute infarction, hemorrhage, hydrocephalus, extra-axial collection or mass lesion/mass effect. Vascular: No hyperdense vessel or unexpected calcification. Skull: Normal. Negative for fracture or focal lesion. Sinuses/Orbits: There is mild diffuse mucoperiosteal thickening of paranasal sinuses. No air-fluid levels. The mastoid air cells are clear. Other: None CT CERVICAL SPINE FINDINGS Alignment: No acute subluxation. Skull base and vertebrae: No acute fracture. Soft tissues and spinal canal: No prevertebral fluid or swelling. No visible canal hematoma. Disc levels:  Anterior fusion at C5-C6 Upper chest: Negative. Other: Negative. IMPRESSION: 1. Unremarkable noncontrast CT of the brain. 2. No acute/traumatic cervical spine pathology. Electronically Signed   By: Elgie CollardArash  Radparvar M.D.   On: 10/14/2017 01:06    Procedures Procedures (including critical care time)  Medications Ordered in ED Medications  sodium chloride 0.9 % bolus 1,000 mL (0 mLs Intravenous Stopped 10/14/17 0029)  morphine 4 MG/ML injection 4 mg (4 mg Intravenous Given 10/13/17 2352)  metoCLOPramide (REGLAN) injection 10 mg (10 mg Intravenous Given 10/13/17 2352)     Initial Impression / Assessment and Plan / ED Course  I have reviewed the triage vital signs and the nursing notes.  Pertinent labs & imaging results that were available during my care of the patient were reviewed by me and considered in my medical decision making (see chart for details).     Patient presents to the ER for evaluation of headache.  Patient had what sounds like a syncopal episode 24 hours ago.  She reports that she was out walking and the next thing she knew she was on the ground.  [facility], but it sounds like she left prior to  completing treatment.  Since then she has had progressively worsening right-sided headache.  She does have a history of migraines, but reports that her migraines are always on the left side.  Her headache does, however, sound migrainous.  She has a pounding, throbbing, unilateral headache associated with severe light sensitivity.  CT head and cervical spine performed, no acute abnormality noted.  She does not have any focal neurologic deficits on examination.  Patient feeling much better after morphine and Reglan.  She does not  have any shortness of breath and her vital signs are normal.  Syncope etiology unclear at this time, but she does report low blood pressure when EMS was called yesterday after the episode.  Since this occurred yesterday and she is much improved now, does not have any risk for serious etiology of syncope, this can be worked up as an outpatient.  [place] Syncope Rule from [employer]  on 10/14/2017 ** All calculations should be rechecked by clinician prior to use **  RESULT SUMMARY:     Patient IS in the low-risk group for serious outcome.   INPUTS: Congestive heart failure history -> 0 = No Hematocrit <30% -> 0 = No EKG abnormal (EKG changed, or any non-sinus rhythm on EKG or monitoring) -> 0 = No Shortness of breath symptoms -> 0 = No Systolic BP <90 mmHg at triage -> 0 = No   Final Clinical Impressions(s) / ED Diagnoses   Final diagnoses:  Syncope, unspecified syncope type  Other migraine without status migrainosus, not intractable  Injury of head, initial encounter    ED Discharge Orders    None       Gilda Crease, MD 10/14/17 [phone]

## 2017-10-13 NOTE — ED Triage Notes (Signed)
The patient had taken a walk and she said she does not remember what happened only to that she woke up to everyone around.  Patient said no one witnessed the fall but she assumes she fell because she has a knot on her head.  Patient said she has a severe headache, light sensitivity.  She said the fall happened yesterday but today while walking at [employer] she became dizzy.  The patient's husband called EMS.

## 2017-10-14 ENCOUNTER — Emergency Department (HOSPITAL_COMMUNITY): Payer: Self-pay

## 2017-10-14 LAB — BASIC METABOLIC PANEL
ANION GAP: 13 (ref 5–15)
BUN: 15 mg/dL (ref 6–20)
CALCIUM: 8.7 mg/dL — AB (ref 8.9–10.3)
CO2: 20 mmol/L — ABNORMAL LOW (ref 22–32)
Chloride: 108 mmol/L (ref 101–111)
Creatinine, Ser: 1.01 mg/dL — ABNORMAL HIGH (ref 0.44–1.00)
Glucose, Bld: 101 mg/dL — ABNORMAL HIGH (ref 65–99)
POTASSIUM: 3.6 mmol/L (ref 3.5–5.1)
SODIUM: 141 mmol/L (ref 135–145)

## 2017-10-14 LAB — I-STAT BETA HCG BLOOD, ED (MC, WL, AP ONLY)

## 2017-10-14 LAB — CBC
HCT: 36.8 % (ref 36.0–46.0)
Hemoglobin: 11.7 g/dL — ABNORMAL LOW (ref 12.0–15.0)
MCH: 27.6 pg (ref 26.0–34.0)
MCHC: 31.8 g/dL (ref 30.0–36.0)
MCV: 86.8 fL (ref 78.0–100.0)
Platelets: 178 10*3/uL (ref 150–400)
RBC: 4.24 MIL/uL (ref 3.87–5.11)
RDW: 13.9 % (ref 11.5–15.5)
WBC: 6 10*3/uL (ref 4.0–10.5)

## 2017-10-22 ENCOUNTER — Encounter (HOSPITAL_COMMUNITY): Payer: Self-pay | Admitting: Family Medicine

## 2017-10-22 ENCOUNTER — Ambulatory Visit (INDEPENDENT_AMBULATORY_CARE_PROVIDER_SITE_OTHER): Payer: Self-pay

## 2017-10-22 ENCOUNTER — Ambulatory Visit (HOSPITAL_COMMUNITY)
Admission: EM | Admit: 2017-10-22 | Discharge: 2017-10-22 | Disposition: A | Discharge status: Home / Self Care | Payer: Self-pay | Attending: Family Medicine | Admitting: Family Medicine

## 2017-10-22 DIAGNOSIS — M25572 Pain in left ankle and joints of left foot: Secondary | ICD-10-CM

## 2017-10-22 NOTE — ED Triage Notes (Signed)
Pt here for left ankle pain, swelling after slipping in the rain with flip flops on. Reports that her foot turned inwards. Took tylenol and no relief.

## 2017-10-22 NOTE — ED Provider Notes (Signed)
[facility]    CSN: [id number] Arrival date & time: 10/22/17  1724     History   Chief Complaint Chief Complaint  Patient presents with  . Ankle Pain    HPI Victoria Middleton is a 37 y.o. female.   37 yo female with past surgical history significant for rod placed in left heel presents with left ankle pain following a fall. She slipped in the rain while wearing flip flops. She is unable to bear weight on the ankle and says pain is severe at this time. Has not tried anything for it.      Past Medical History:  Diagnosis Date  . Asthma   . Depression   . Drug-seeking behavior   . Headache(784.0)   . Kidney stones   . Migraine   . Narcotic abuse (HCC)   . Seizures (HCC)   . Suicide attempt [facility])     Patient Active Problem List   Diagnosis Date Noted  . Overdose 11/24/2016  . Elevated serum creatinine 11/24/2016  . Seizure disorder (HCC) 08/24/2016  . Intractable chronic migraine without aura and with status migrainosus 01/24/2016  . Major depressive disorder, recurrent episode, moderate (HCC) 09/13/2012    Past Surgical History:  Procedure Laterality Date  . ANKLE SURGERY     left  . ANTERIOR CERVICAL DECOMP/DISCECTOMY FUSION  2018  . APPENDECTOMY    . CHOLECYSTECTOMY    . KNEE SURGERY    . TONSILLECTOMY      OB History    Gravida  0   Para      Term      Preterm      AB      Living        SAB      TAB      Ectopic      Multiple      Live Births               Home Medications    Prior to Admission medications   Medication Sig Start Date End Date Taking? Authorizing Provider  acetaminophen (TYLENOL) 500 MG tablet Take 1,000 mg by mouth every 4 (four) hours as needed for headache (pain).     [provider]  levETIRAcetam (KEPPRA) 1000 MG tablet Take 1,000 mg by mouth 2 (two) times daily.    [provider]    Family History Family History  Problem Relation Age of Onset  . Obesity Mother   .  Diabetes Mother   . Hypertension Mother   . Depression Mother   . Suicidality Mother   . Diabetes Father   . Hypertension Father   . Heart attack Father     Social History Social History   Tobacco Use  . Smoking status: Never Smoker  . Smokeless tobacco: Never Used  Substance Use Topics  . Alcohol use: Yes    Comment: Rare  . Drug use: No     Allergies   Compazine [prochlorperazine edisylate]; Sulfa antibiotics; Aripiprazole; Benadryl [diphenhydramine hcl]; Ketorolac tromethamine; Naprosyn [naproxen]; Nsaids; Penicillins; and Toradol [ketorolac tromethamine]   Review of Systems Review of Systems  Constitutional: Negative for activity change and appetite change.  HENT: Negative for congestion and ear pain.   Eyes: Negative for discharge and itching.  Respiratory: Negative for apnea.   Cardiovascular: Negative for chest pain and palpitations.  Gastrointestinal: Negative for abdominal distention and abdominal pain.  Endocrine: Negative for cold intolerance and heat intolerance.  Genitourinary: Negative for difficulty  urinating and dyspareunia.  Musculoskeletal: Negative for arthralgias and back pain.       Ankle injury   Neurological: Negative for dizziness and headaches.  Hematological: Negative for adenopathy. Does not bruise/bleed easily.     Physical Exam Triage Vital Signs ED Triage Vitals  Enc Vitals Group     BP      Pulse      Resp      Temp      Temp src      SpO2      Weight      Height      Head Circumference      Peak Flow      Pain Score      Pain Loc      Pain Edu?      Excl. in GC?    No data found.  Updated Vital Signs LMP 10/08/2017   Visual Acuity Right Eye Distance:   Left Eye Distance:   Bilateral Distance:    Right Eye Near:   Left Eye Near:    Bilateral Near:     Physical Exam  Constitutional: She is oriented to person, place, and time. She appears well-developed and well-nourished.  HENT:  Head: Normocephalic and  atraumatic.  Eyes: Pupils are equal, round, and reactive to light. EOM are normal.  Neck: Normal range of motion. Neck supple.  Cardiovascular: Normal rate and intact distal pulses.  Pulmonary/Chest: Effort normal. No respiratory distress.  Musculoskeletal: She exhibits no deformity.  Left ankle: swelling is difficult to assess due to paitent's BMI. No ecchymosis or erythema. She is tender to palpation along medial aspect of ankle. Normal ROM.  Neurological: She is alert and oriented to person, place, and time.  Skin: Skin is warm and dry.  Psychiatric: She has a normal mood and affect. Her behavior is normal.     UC Treatments / Results  Labs (all labs ordered are listed, but only abnormal results are displayed) Labs Reviewed - No data to display  EKG None Radiology Dg Ankle Complete Left  Result Date: 10/22/2017 CLINICAL DATA:  Initial evaluation for acute injury. History of prior surgery. EXAM: LEFT ANKLE COMPLETE - 3+ VIEW COMPARISON:  Prior radiograph from 04/20/2017. FINDINGS: No acute fracture or dislocation. Ankle mortise approximated. Talar dome intact. No appreciable joint effusion or significant soft tissue swelling. Cannulated lack fixation screw in place at the calcaneus, stable. No evidence for hardware complication. IMPRESSION: 1. No acute osseous abnormality about the left ankle. 2. Sequelae of prior ORIF at the left calcaneus without hardware complication. Electronically Signed   By: Rise MuBenjamin  McClintock M.D.   On: 10/22/2017 18:00    Procedures Procedures (including critical care time)  Medications Ordered in UC Medications - No data to display   Initial Impression / Assessment and Plan / UC Course  I have reviewed the triage vital signs and the nursing notes.  Pertinent labs & imaging results that were available during my care of the patient were reviewed by me and considered in my medical decision making (see chart for details).     1. Left ankle sprain- XR  negative for fracture. Advised RICE at home. Follow up with PCP as needed.  Final Clinical Impressions(s) / UC Diagnoses   Final diagnoses:  None    ED Discharge Orders    None       Controlled Substance Prescriptions [place] Controlled Substance Registry consulted? Not Applicable   Rolm BookbinderMoss, Requan Hardge, DO 10/22/17 [phone]

## 2017-11-13 ENCOUNTER — Other Ambulatory Visit: Payer: Self-pay

## 2017-11-13 ENCOUNTER — Emergency Department (HOSPITAL_COMMUNITY)
Admission: EM | Admit: 2017-11-13 | Discharge: 2017-11-13 | Disposition: A | Discharge status: Home / Self Care | Payer: Self-pay | Attending: Emergency Medicine | Admitting: Emergency Medicine

## 2017-11-13 ENCOUNTER — Encounter (HOSPITAL_COMMUNITY): Payer: Self-pay

## 2017-11-13 ENCOUNTER — Emergency Department (HOSPITAL_COMMUNITY): Payer: Self-pay

## 2017-11-13 DIAGNOSIS — M542 Cervicalgia: Secondary | ICD-10-CM | POA: Insufficient documentation

## 2017-11-13 DIAGNOSIS — J45909 Unspecified asthma, uncomplicated: Secondary | ICD-10-CM | POA: Insufficient documentation

## 2017-11-13 DIAGNOSIS — R55 Syncope and collapse: Secondary | ICD-10-CM | POA: Insufficient documentation

## 2017-11-13 LAB — BASIC METABOLIC PANEL
Anion gap: 13 (ref 5–15)
BUN: 10 mg/dL (ref 6–20)
CHLORIDE: 101 mmol/L (ref 101–111)
CO2: 25 mmol/L (ref 22–32)
CREATININE: 0.89 mg/dL (ref 0.44–1.00)
Calcium: 9 mg/dL (ref 8.9–10.3)
GFR calc Af Amer: >60 mL/min (ref >60)
GFR calc non Af Amer: >60 mL/min (ref >60)
GLUCOSE: 82 mg/dL (ref 65–99)
POTASSIUM: 3.7 mmol/L (ref 3.5–5.1)
Sodium: 139 mmol/L (ref 135–145)

## 2017-11-13 LAB — URINALYSIS, ROUTINE W REFLEX MICROSCOPIC
Bilirubin Urine: NEGATIVE
Glucose, UA: NEGATIVE mg/dL
Ketones, ur: NEGATIVE mg/dL
Nitrite: NEGATIVE
PROTEIN: NEGATIVE mg/dL
SPECIFIC GRAVITY, URINE: 1.011 (ref 1.005–1.030)
pH: 5 (ref 5.0–8.0)

## 2017-11-13 LAB — CBC WITH DIFFERENTIAL/PLATELET
Basophils Absolute: 0 10*3/uL (ref 0.0–0.1)
Basophils Relative: 0 %
EOS ABS: 0.1 10*3/uL (ref 0.0–0.7)
EOS PCT: 1 %
HCT: 41.5 % (ref 36.0–46.0)
HEMOGLOBIN: 13.1 g/dL (ref 12.0–15.0)
LYMPHS PCT: 11 %
Lymphs Abs: 1 10*3/uL (ref 0.7–4.0)
MCH: 28 pg (ref 26.0–34.0)
MCHC: 31.6 g/dL (ref 30.0–36.0)
MCV: 88.7 fL (ref 78.0–100.0)
MONOS PCT: 7 %
Monocytes Absolute: 0.6 10*3/uL (ref 0.1–1.0)
NEUTROS PCT: 81 %
Neutro Abs: 7.2 10*3/uL (ref 1.7–7.7)
Platelets: 216 10*3/uL (ref 150–400)
RBC: 4.68 MIL/uL (ref 3.87–5.11)
RDW: 14.2 % (ref 11.5–15.5)
WBC: 8.9 10*3/uL (ref 4.0–10.5)

## 2017-11-13 LAB — RAPID URINE DRUG SCREEN, HOSP PERFORMED
AMPHETAMINES: NOT DETECTED
BENZODIAZEPINES: NOT DETECTED
Barbiturates: NOT DETECTED
COCAINE: NOT DETECTED
OPIATES: NOT DETECTED
TETRAHYDROCANNABINOL: NOT DETECTED

## 2017-11-13 LAB — I-STAT BETA HCG BLOOD, ED (MC, WL, AP ONLY): I-stat hCG, quantitative: <5 m[IU]/mL (ref <5)

## 2017-11-13 MED ORDER — ACETAMINOPHEN 325 MG PO TABS
650.0000 mg | ORAL_TABLET | Freq: Once | ORAL | Status: AC
  Administered 2017-11-13: 650 mg via ORAL
  Filled 2017-11-13: qty 2

## 2017-11-13 MED ORDER — TRAMADOL HCL 50 MG PO TABS: 100.0000 mg | ORAL_TABLET | Freq: Once | ORAL | Status: DC

## 2017-11-13 MED ORDER — METHOCARBAMOL 500 MG PO TABS
750.0000 mg | ORAL_TABLET | Freq: Once | ORAL | Status: AC
  Administered 2017-11-13: 750 mg via ORAL
  Filled 2017-11-13: qty 2

## 2017-11-13 MED ORDER — METHOCARBAMOL 500 MG PO TABS: 500.0000 mg | ORAL_TABLET | Freq: Two times a day (BID) | ORAL | 0 refills | Status: DC

## 2017-11-13 NOTE — ED Notes (Signed)
ED Provider at bedside. 

## 2017-11-13 NOTE — Discharge Instructions (Addendum)
Continue tylenol for pain. Robaxin for muscle spasms. Drink plenty of fluids. Your blood work today, your orthostatic vital signs all within normal. Please follow up with family doctor if not improving.

## 2017-11-13 NOTE — ED Notes (Signed)
[place] sent down to Main Lab.

## 2017-11-13 NOTE — ED Notes (Signed)
Husband to nurse 1st , she has some tingling in her rt. Pinky finger, started about 10 to 15 minutes ago.

## 2017-11-13 NOTE — ED Triage Notes (Signed)
Pt was at work today and had a migraine, came home and went to sleep. When husband got home pt was on the floor face down, does not remember getting out of bed. Pt c/o neck pain with hx of c5 c6 fusion. Pt has hx of seizures. Fall was unwitnessed. Pt reports she was nauseated after EMS gave 4 mg Zofran PTA. Pt c/o dizziness, no change in VSS with change in position. Denies numbness or tingling. BP 140/90 HR 80 CBG 130 SPO2 97% RA. 20 L AC.

## 2017-11-13 NOTE — ED Provider Notes (Signed)
Patient placed in Quick Look pathway, seen and evaluated   Chief Complaint: LOC, Neck Pain   HPI:   37 y.o. past medical history of depression, migraines, seizures who presents for evaluation of loss of consciousness.  Per husband, he came home approximately 30 minutes prior to ED arrival and found patient face down on the floor.  He states that patient was "semi-conscious" and was having difficulty responding to him.  He is unsure of how Balis she had been down there.  Patient states that the last thing she remembers was going to take a nap on the bed.  She does not recall what time this was.  Husband states that there is no vomiting but patient had had urinary incontinence.  He does report the patient has a history of seizures and is on Keppra.  Patient states she has been compliant with her Keppra.  Patient reports that today, prior to onset of symptoms she had been experiencing migraine headache.  On ED arrival, patient is complaining of neck pain.  Of note, patient has had similar episodes before.  Patient denies any chest pain, vision changes, numbness/weakness of her arms or legs, saddle anesthesia.  ROS: LOC, neck pain  Physical Exam:   Gen: No distress  Neuro: Awake and Alert  Skin: Warm    Focused Exam: Tenderness palpation to the midline C-spine at approximately the C4, C5, C6, C7 level.  No deformity, step-off noted.  Limited range of motion secondary to pain.  5 out of 5 strength of bilateral upper and lower extremities.  Regular rate and rhythm. PERRL.    Initiation of care has begun. The patient has been counseled on the process, plan, and necessity for staying for the completion/evaluation, and the remainder of the medical screening examination    Rosana Hoes 11/13/17 1557    Mesner, Barbara Cower, MD 11/15/17 [phone]

## 2017-11-13 NOTE — ED Notes (Signed)
Pt ambulated with this RN without difficulties, denies any dizziness.

## 2017-11-13 NOTE — ED Provider Notes (Signed)
[facility] EMERGENCY DEPARTMENT Provider Note   CSN: [id number] Arrival date & time: 11/13/17  1539     History   Chief Complaint Chief Complaint  Patient presents with  . Loss of Consciousness    HPI Victoria Middleton is a 37 y.o. female.  HPI Victoria Middleton is a 37 y.o. female with history of seizures, headaches, opiate abuse, presents to emergency department with complaint of syncope.  Patient states that she had a migraine earlier today for which she took Tylenol.  She states she laid down to take a nap because her head was hurting, and the next thing she remembers is waking up on the floor.  Her husband states that he heard her fall and ran to her and states that she was responding but confused.  He states that she must of gotten out of bed and walk few steps because he found her next to the dresser which was few feet away from the bed.  He does not believe she had a seizure because patient became quickly oriented.  She has been compliant with her Keppra.  She states that she has had similar syncopal episodes in the past and states that she is unsure what causes them.  She denies any nausea or vomiting.  She states she has been eating and drinking well.  She states sometimes the syncopal episodes are associated with her migraine.  She is currently complaining of neck pain, history of cervical fusion C5-C6.  Denies any numbness or weakness in extremities.  She states her headache in fact has resolved.  She also complaining of mild pain in the hip from the fall.  No other complaints.  Past Medical History:  Diagnosis Date  . Asthma   . Depression   . Drug-seeking behavior   . Headache(784.0)   . Kidney stones   . Migraine   . Narcotic abuse (HCC)   . Seizures (HCC)   . Suicide attempt [facility])     Patient Active Problem List   Diagnosis Date Noted  . Overdose 11/24/2016  . Elevated serum creatinine 11/24/2016  . Seizure disorder (HCC) 08/24/2016  .  Intractable chronic migraine without aura and with status migrainosus 01/24/2016  . Major depressive disorder, recurrent episode, moderate (HCC) 09/13/2012    Past Surgical History:  Procedure Laterality Date  . ANKLE SURGERY     left  . ANTERIOR CERVICAL DECOMP/DISCECTOMY FUSION  2018  . APPENDECTOMY    . CHOLECYSTECTOMY    . KNEE SURGERY    . TONSILLECTOMY       OB History    Gravida  0   Para      Term      Preterm      AB      Living        SAB      TAB      Ectopic      Multiple      Live Births               Home Medications    Prior to Admission medications   Medication Sig Start Date End Date Taking? Authorizing Provider  acetaminophen (TYLENOL) 500 MG tablet Take 1,000 mg by mouth every 4 (four) hours as needed for headache (pain).    Yes [provider]  levETIRAcetam (KEPPRA) 1000 MG tablet Take 1,000 mg by mouth 2 (two) times daily.   Yes [provider]    Family History Family History  Problem Relation Age of Onset  . Obesity Mother   . Diabetes Mother   . Hypertension Mother   . Depression Mother   . Suicidality Mother   . Diabetes Father   . Hypertension Father   . Heart attack Father     Social History Social History   Tobacco Use  . Smoking status: Never Smoker  . Smokeless tobacco: Never Used  Substance Use Topics  . Alcohol use: Yes    Comment: Rare  . Drug use: No     Allergies   Compazine [prochlorperazine edisylate]; Sulfa antibiotics; Aripiprazole; Benadryl [diphenhydramine hcl]; Ketorolac tromethamine; Naprosyn [naproxen]; Nsaids; Penicillins; and Toradol [ketorolac tromethamine]   Review of Systems Review of Systems  Constitutional: Negative for chills and fever.  Respiratory: Negative for cough, chest tightness and shortness of breath.   Cardiovascular: Negative for chest pain, palpitations and leg swelling.  Gastrointestinal: Negative for abdominal pain, diarrhea, nausea and vomiting.   Genitourinary: Negative for dysuria, flank pain and pelvic pain.  Musculoskeletal: Positive for arthralgias, neck pain and neck stiffness. Negative for myalgias.  Skin: Negative for rash.  Neurological: Positive for dizziness, syncope, light-headedness and headaches. Negative for weakness.  All other systems reviewed and are negative.    Physical Exam Updated Vital Signs BP (!) 152/91 (BP Location: Right Arm)   Pulse 82   Temp 98.3 F (36.8 C) (Oral)   Resp 18   Ht  (1.727 m)   Wt (!) 138.8 kg (306 lb)   LMP 11/05/2017   SpO2 100%   BMI 46.53 kg/m   Physical Exam  Constitutional: She is oriented to person, place, and time. She appears well-developed and well-nourished. No distress.  HENT:  Head: Normocephalic and atraumatic.  No hemotympanum.   Eyes: Pupils are equal, round, and reactive to light. Conjunctivae and EOM are normal.  Neck: Neck supple.  Tenderness to palpation over midline cervical spine.  Tenderness to palpation of bilateral paraspinal muscles.  Cardiovascular: Normal rate, regular rhythm and normal heart sounds.  Pulmonary/Chest: Effort normal and breath sounds normal. No respiratory distress. She has no wheezes. She has no rales.  Abdominal: Soft. Bowel sounds are normal. She exhibits no distension. There is no tenderness. There is no rebound.  Musculoskeletal: She exhibits no edema.  Neurological: She is alert and oriented to person, place, and time.  5/5 and equal upper and lower extremity strength bilaterally. Equal grip strength bilaterally. Normal finger to nose and heel to shin. No pronator drift.   Skin: Skin is warm and dry.  Psychiatric: She has a normal mood and affect. Her behavior is normal.  Nursing note and vitals reviewed.    ED Treatments / Results  Labs (all labs ordered are listed, but only abnormal results are displayed) Labs Reviewed  URINALYSIS, ROUTINE W REFLEX MICROSCOPIC - Abnormal; Notable for the following components:       Result Value   APPearance HAZY (*)    Hgb urine dipstick SMALL (*)    Leukocytes, UA TRACE (*)    Bacteria, UA RARE (*)    All other components within normal limits  CBC WITH DIFFERENTIAL/PLATELET  BASIC METABOLIC PANEL  RAPID URINE DRUG SCREEN, HOSP PERFORMED  LEVETIRACETAM LEVEL  I-STAT BETA HCG BLOOD, ED ([facility], [facility], [facility] ONLY)    EKG None  Radiology Ct Head Wo Contrast  Result Date: 11/13/2017 CLINICAL DATA:  Migraine fell out of bed neck pain history of C5-C6 fusion EXAM: CT HEAD WITHOUT CONTRAST CT CERVICAL SPINE WITHOUT CONTRAST  TECHNIQUE: Multidetector CT imaging of the head and cervical spine was performed following the standard protocol without intravenous contrast. Multiplanar CT image reconstructions of the cervical spine were also generated. COMPARISON:  CT 10/14/2017, 03/07/2017 FINDINGS: CT HEAD FINDINGS Brain: No evidence of acute infarction, hemorrhage, hydrocephalus, extra-axial collection or mass lesion/mass effect. Vascular: No hyperdense vessel or unexpected calcification. Skull: Normal. Negative for fracture or focal lesion. Sinuses/Orbits: Mild mucosal thickening in the ethmoid sinuses. No acute orbital abnormality. Other: None CT CERVICAL SPINE FINDINGS Alignment: Straightening of the cervical spine. No subluxation. Facet alignment within normal limits. Skull base and vertebrae: No acute fracture. No primary bone lesion or focal pathologic process. Limbus vertebra at C7. Soft tissues and spinal canal: No prevertebral fluid or swelling. No visible canal hematoma. Disc levels: Anterior plate and screw fixation C5-C6 with solid bone fusion present. Minimal degenerative changes at C6-C7. Upper chest: Negative. Other: None IMPRESSION: 1. Negative non contrasted CT appearance of the brain 2. Straightening of the cervical spine with postsurgical changes at C5-C6. No acute osseous abnormality. Electronically Signed   By: Jasmine Pang M.D.   On: 11/13/2017 18:04   Ct Cervical Spine  Wo Contrast  Result Date: 11/13/2017 CLINICAL DATA:  Migraine fell out of bed neck pain history of C5-C6 fusion EXAM: CT HEAD WITHOUT CONTRAST CT CERVICAL SPINE WITHOUT CONTRAST TECHNIQUE: Multidetector CT imaging of the head and cervical spine was performed following the standard protocol without intravenous contrast. Multiplanar CT image reconstructions of the cervical spine were also generated. COMPARISON:  CT 10/14/2017, 03/07/2017 FINDINGS: CT HEAD FINDINGS Brain: No evidence of acute infarction, hemorrhage, hydrocephalus, extra-axial collection or mass lesion/mass effect. Vascular: No hyperdense vessel or unexpected calcification. Skull: Normal. Negative for fracture or focal lesion. Sinuses/Orbits: Mild mucosal thickening in the ethmoid sinuses. No acute orbital abnormality. Other: None CT CERVICAL SPINE FINDINGS Alignment: Straightening of the cervical spine. No subluxation. Facet alignment within normal limits. Skull base and vertebrae: No acute fracture. No primary bone lesion or focal pathologic process. Limbus vertebra at C7. Soft tissues and spinal canal: No prevertebral fluid or swelling. No visible canal hematoma. Disc levels: Anterior plate and screw fixation C5-C6 with solid bone fusion present. Minimal degenerative changes at C6-C7. Upper chest: Negative. Other: None IMPRESSION: 1. Negative non contrasted CT appearance of the brain 2. Straightening of the cervical spine with postsurgical changes at C5-C6. No acute osseous abnormality. Electronically Signed   By: Jasmine Pang M.D.   On: 11/13/2017 18:04    Procedures Procedures (including critical care time)  Medications Ordered in ED Medications  methocarbamol (ROBAXIN) tablet 750 mg (750 mg Oral Given 11/13/17 2220)  acetaminophen (TYLENOL) tablet 650 mg (650 mg Oral Given 11/13/17 2220)     Initial Impression / Assessment and Plan / ED Course  I have reviewed the triage vital signs and the nursing notes.  Pertinent labs & imaging  results that were available during my care of the patient were reviewed by me and considered in my medical decision making (see chart for details).    Patient in emergency department after possible syncopal  a visit episode, versus seizure.  She is currently alert and oriented, only complaint is neck pain.  Her labs are all unremarkable.  She is not pregnant.  Drug screen is negative.  CT head and cervical spine are negative.  She is neurovascularly intact, normal neurological exam.  Cervical spine cleared.  I will check orthostatics on her, Robaxin and Tylenol ordered for her pain, will ambulate.  Patient is not orthostatic.  She ambulated in the emergency department with no difficulties, no dizziness, no symptoms at all.  She is still having some soreness in her neck.  She admits that she was mostly concerned about her neck given that she has had a cervical fusion of C5-C6 in the past.  Her CT scan did not show any acute findings in her neck.  She is neurovascularly intact, no focal neurological findings on the exam.  Vital signs are all within normal.  She is stable for discharge home at this time.  I will start her Robaxin for muscle spasms.  Advised to continue take Tylenol.  She will follow-up with her family doctor and with her neurosurgeon as needed.  Return precautions discussed.   Vitals:   11/13/17 1551 11/13/17 1801 11/13/17 2019 11/13/17 2221  BP:  132/89 (!) 152/91 117/74  Pulse:  72 82 76  Resp:  Temp:   98.3 F (36.8 C)   TempSrc:   Oral   SpO2:  99% 100% 97%  Weight: (!) 138.8 kg (306 lb)     Height:  (1.727 m)       Final Clinical Impressions(s) / ED Diagnoses   Final diagnoses:  Syncope and collapse  Neck pain    ED Discharge Orders        Ordered    methocarbamol (ROBAXIN) 500 MG tablet  2 times daily     11/13/17 2314       Jaynie Crumble, PA-C 11/13/17 2318    Raeford Razor, MD 11/15/17 2254

## 2017-11-16 LAB — LEVETIRACETAM LEVEL: LEVETIRACETAM: NOT DETECTED ug/mL (ref 10.0–40.0)

## 2017-12-10 ENCOUNTER — Emergency Department (HOSPITAL_COMMUNITY)
Admission: EM | Admit: 2017-12-10 | Discharge: 2017-12-10 | Disposition: A | Discharge status: Home / Self Care | Payer: Self-pay | Attending: Emergency Medicine | Admitting: Emergency Medicine

## 2017-12-10 ENCOUNTER — Other Ambulatory Visit: Payer: Self-pay

## 2017-12-10 ENCOUNTER — Encounter (HOSPITAL_COMMUNITY): Payer: Self-pay | Admitting: *Deleted

## 2017-12-10 ENCOUNTER — Emergency Department (HOSPITAL_COMMUNITY): Payer: Self-pay

## 2017-12-10 DIAGNOSIS — Z9049 Acquired absence of other specified parts of digestive tract: Secondary | ICD-10-CM | POA: Insufficient documentation

## 2017-12-10 DIAGNOSIS — F329 Major depressive disorder, single episode, unspecified: Secondary | ICD-10-CM | POA: Insufficient documentation

## 2017-12-10 DIAGNOSIS — W010XXA Fall on same level from slipping, tripping and stumbling without subsequent striking against object, initial encounter: Secondary | ICD-10-CM | POA: Insufficient documentation

## 2017-12-10 DIAGNOSIS — Z79899 Other long term (current) drug therapy: Secondary | ICD-10-CM | POA: Insufficient documentation

## 2017-12-10 DIAGNOSIS — J45909 Unspecified asthma, uncomplicated: Secondary | ICD-10-CM | POA: Insufficient documentation

## 2017-12-10 DIAGNOSIS — M25571 Pain in right ankle and joints of right foot: Secondary | ICD-10-CM | POA: Insufficient documentation

## 2017-12-10 MED ORDER — HYDROCODONE-ACETAMINOPHEN 5-325 MG PO TABS: 1.0000 | ORAL_TABLET | Freq: Four times a day (QID) | ORAL | 0 refills | Status: DC | PRN

## 2017-12-10 NOTE — ED Triage Notes (Signed)
[place] and rolled her R ankle. Reports increased pain with ambulation

## 2017-12-10 NOTE — ED Provider Notes (Signed)
[facility]   CSN: [id number] Arrival date & time: 12/10/17  1900     History   Chief Complaint Chief Complaint  Patient presents with  . Ankle Pain    HPI Victoria Middleton is a 37 y.o. female with a past medical history of suicide attempt, narcotic abuse, seizures, who presents today for evaluation of pain in her right ankle.  She reports that she was out hiking with her husband when she had a mechanical stumble causing her to roll her right ankle.  She reports pain and swelling on the lateral aspect.  She denies any other injuries.  She reports that she is unable to bear weight secondary to pain.  She has been trying Tylenol she reports she is allergic to NSAIDs.  HPI  Past Medical History:  Diagnosis Date  . Asthma   . Depression   . Drug-seeking behavior   . Headache(784.0)   . Kidney stones   . Migraine   . Narcotic abuse (HCC)   . Seizures (HCC)   . Suicide attempt [facility])     Patient Active Problem List   Diagnosis Date Noted  . Overdose 11/24/2016  . Elevated serum creatinine 11/24/2016  . Seizure disorder (HCC) 08/24/2016  . Intractable chronic migraine without aura and with status migrainosus 01/24/2016  . Major depressive disorder, recurrent episode, moderate (HCC) 09/13/2012    Past Surgical History:  Procedure Laterality Date  . ANKLE SURGERY     left  . ANTERIOR CERVICAL DECOMP/DISCECTOMY FUSION  2018  . APPENDECTOMY    . CHOLECYSTECTOMY    . KNEE SURGERY    . TONSILLECTOMY       OB History    Gravida  0   Para      Term      Preterm      AB      Living        SAB      TAB      Ectopic      Multiple      Live Births               Home Medications    Prior to Admission medications   Medication Sig Start Date End Date Taking? Authorizing Provider  acetaminophen (TYLENOL) 500 MG tablet Take 1,000 mg by mouth every 4 (four) hours as needed for headache (pain).      [provider]  HYDROcodone-acetaminophen (NORCO/VICODIN) 5-325 MG tablet Take 1 tablet by mouth every 6 (six) hours as needed. 12/10/17   Victoria Gong, PA-C  levETIRAcetam (KEPPRA) 1000 MG tablet Take 1,000 mg by mouth 2 (two) times daily.    [provider]  methocarbamol (ROBAXIN) 500 MG tablet Take 1 tablet (500 mg total) by mouth 2 (two) times daily. 11/13/17   Victoria Crumble, PA-C    Family History Family History  Problem Relation Age of Onset  . Obesity Mother   . Diabetes Mother   . Hypertension Mother   . Depression Mother   . Suicidality Mother   . Diabetes Father   . Hypertension Father   . Heart attack Father     Social History Social History   Tobacco Use  . Smoking status: Never Smoker  . Smokeless tobacco: Never Used  Substance Use Topics  . Alcohol use: Yes    Comment: Rare  . Drug use: No     Allergies   Compazine [prochlorperazine edisylate]; Sulfa antibiotics; Aripiprazole;  Benadryl [diphenhydramine hcl]; Ketorolac tromethamine; Naprosyn [naproxen]; Nsaids; Penicillins; and Toradol [ketorolac tromethamine]   Review of Systems Review of Systems  Constitutional: Negative for chills and fever.  Musculoskeletal: Negative for neck pain and neck stiffness.       Ankle pain  Neurological: Negative for weakness, numbness and headaches.     Physical Exam Updated Vital Signs BP (!) 156/84   Pulse 88   Temp 99.2 F (37.3 C) (Oral)   Resp 16   LMP 12/03/2017   SpO2 98%   Physical Exam  Constitutional: She appears well-developed and well-nourished.  HENT:  Head: Normocephalic and atraumatic.  Cardiovascular: Intact distal pulses.  Right sided 2+ DP pulse.   Musculoskeletal:  Tenderness to palpation over right lateral ankle.  No proximal tibia-fibula tenderness to palpation.  Physical exam limited secondary to body habitus.  Neurological:  Sensation intact to right foot.  Skin: She is not diaphoretic.  No obvious  wounds over right ankle or foot.  Mild bruising on the lateral ankle.  Psychiatric: She has a normal mood and affect.  Nursing note and vitals reviewed.    ED Treatments / Results  Labs (all labs ordered are listed, but only abnormal results are displayed) Labs Reviewed - No data to display  EKG None  Radiology Dg Ankle Complete Right  Result Date: 12/10/2017 CLINICAL DATA:  Rolled right ankle while walking on trail, with right ankle pain. Initial encounter. EXAM: RIGHT ANKLE - COMPLETE 3+ VIEW COMPARISON:  None. FINDINGS: There is no evidence of fracture or dislocation. The ankle mortise is intact; the interosseous space is within normal limits. No talar tilt or subluxation is seen. The joint spaces are preserved. No significant soft tissue abnormalities are seen. IMPRESSION: No evidence of fracture or dislocation. Electronically Signed   By: Roanna Raider M.D.   On: 12/10/2017 19:30    Procedures Procedures (including critical care time)  Medications Ordered in ED Medications - No data to display   Initial Impression / Assessment and Plan / ED Course  I have reviewed the triage vital signs and the nursing notes.  Pertinent labs & imaging results that were available during my care of the patient were reviewed by me and considered in my medical decision making (see chart for details).    Victoria Middleton Presents with right ankle pain after rolling her ankle consistent with an ankle sprain/strain.  The affected ankle has mild edema and is tender on the lateral aspect.  X-rays were obtained with out acute abnormality. The skin is intact to ankle/foot.  The foot is warm and well perfused with intact sensation.  Motor function is limited secondary to pain.  Patient given instructions for OTC pain medication, ace wrap, crutches.  Patient advised to follow up with PCP if symptoms persist for longer than one week.  Patient was given the option to ask questions, all of which were answered  to the best of my ability.  Patient is agreeable for discharge.    Consistent with the STOP act, the [place] pmp was queried for the patient based on the information and address listed in the medical record for the past year, prior to the prescription of home narcotic medications.  Discussed with patient the risk of addiction, and safe and proper use of narcotic medication.  Patient understands to take these only at night.    Final Clinical Impressions(s) / ED Diagnoses   Final diagnoses:  Acute right ankle pain    ED Discharge Orders  Ordered    HYDROcodone-acetaminophen (NORCO/VICODIN) 5-325 MG tablet  Every 6 hours PRN     12/10/17 2100       Victoria Gong, PA-C 12/10/17 2114    Gwyneth Sprout, MD 12/10/17 [phone]

## 2017-12-10 NOTE — ED Notes (Signed)
Patient left at this time with all belongings. 

## 2017-12-10 NOTE — Discharge Instructions (Addendum)
Please take Tylenol (acetaminophen) to relieve your pain.  You may take tylenol, up to 1,000 mg (two extra strength pills).  Do not take more than 3,000 mg tylenol in a 24 hour period.  Please check all medication labels as many medications such as pain and cold medications may contain tylenol. Please do not drink alcohol while taking this medication.  ° °You are being prescribed a medication which may make you sleepy. For 24 hours after one dose please do not drive, operate heavy machinery, care for a small child with out another adult present, or perform any activities that may cause harm to you or someone else if you were to fall asleep or be impaired.  ° °

## 2018-01-14 ENCOUNTER — Emergency Department (HOSPITAL_COMMUNITY)
Admission: EM | Admit: 2018-01-14 | Discharge: 2018-01-14 | Disposition: A | Discharge status: Home / Self Care | Payer: Self-pay | Attending: Emergency Medicine | Admitting: Emergency Medicine

## 2018-01-14 ENCOUNTER — Encounter (HOSPITAL_COMMUNITY): Payer: Self-pay | Admitting: Emergency Medicine

## 2018-01-14 DIAGNOSIS — M5441 Lumbago with sciatica, right side: Secondary | ICD-10-CM | POA: Insufficient documentation

## 2018-01-14 DIAGNOSIS — Z79899 Other long term (current) drug therapy: Secondary | ICD-10-CM | POA: Insufficient documentation

## 2018-01-14 MED ORDER — CYCLOBENZAPRINE HCL 10 MG PO TABS
5.0000 mg | ORAL_TABLET | Freq: Once | ORAL | Status: AC
  Administered 2018-01-14: 5 mg via ORAL
  Filled 2018-01-14: qty 1

## 2018-01-14 MED ORDER — ACETAMINOPHEN 325 MG PO TABS
650.0000 mg | ORAL_TABLET | Freq: Once | ORAL | Status: AC
  Administered 2018-01-14: 650 mg via ORAL
  Filled 2018-01-14: qty 2

## 2018-01-14 MED ORDER — CYCLOBENZAPRINE HCL 10 MG PO TABS: 10.0000 mg | ORAL_TABLET | Freq: Two times a day (BID) | ORAL | 0 refills | Status: DC | PRN

## 2018-01-14 NOTE — ED Triage Notes (Addendum)
Per [facility]: Patient to ED c/o sudden onset lower back pain while driving with radiation down her R leg. Pt states she's never had this before. Ambulatory with steady gait, able to bear weight, but reports pain. Denies incontinence or numbness. Pain worse with movement/walking. EMS VS: 166/90, P 93, RR 20.

## 2018-01-14 NOTE — ED Notes (Signed)
See EDP assessment 

## 2018-01-14 NOTE — ED Provider Notes (Signed)
[facility]   CSN: [id number] Arrival date & time: 01/14/18  1417     History   Chief Complaint Chief Complaint  Patient presents with  . Back Pain    HPI Victoria Middleton is a 37 y.o. female.  HPI   37 year old female presents today with complaints of low back pain.  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  She notes that no injuries at work, but was driving home from work and felt a sharp pain in her right lower back.  She notes this radiated down her right leg.  She denies any loss of distal strength or motor function.  She does note her foot felt numb.  Patient denies any trauma, denies any other complaints.  Past Medical History:  Diagnosis Date  . Asthma   . Depression   . Drug-seeking behavior   . Headache(784.0)   . Kidney stones   . Migraine   . Narcotic abuse (HCC)   . Seizures (HCC)   . Suicide attempt [facility](HCC)     Patient Active Problem List   Diagnosis Date Noted  . Overdose 11/24/2016  . Elevated serum creatinine 11/24/2016  . Seizure disorder (HCC) 08/24/2016  . Intractable chronic migraine without aura and with status migrainosus 01/24/2016  . Major depressive disorder, recurrent episode, moderate (HCC) 09/13/2012    Past Surgical History:  Procedure Laterality Date  . ANKLE SURGERY     left  . ANTERIOR CERVICAL DECOMP/DISCECTOMY FUSION  2018  . APPENDECTOMY    . CHOLECYSTECTOMY    . KNEE SURGERY    . TONSILLECTOMY       OB History    Gravida  0   Para      Term      Preterm      AB      Living        SAB      TAB      Ectopic      Multiple      Live Births               Home Medications    Prior to Admission medications   Medication Sig Start Date End Date Taking? Authorizing Provider  acetaminophen (TYLENOL) 500 MG tablet Take 1,000 mg by mouth every 4 (four) hours as needed for headache (pain).     [provider]  cyclobenzaprine  (FLEXERIL) 10 MG tablet Take 1 tablet (10 mg total) by mouth 2 (two) times daily as needed for muscle spasms. 01/14/18   Arla Boutwell, Tinnie GensJeffrey, PA-C  HYDROcodone-acetaminophen (NORCO/VICODIN) 5-325 MG tablet Take 1 tablet by mouth every 6 (six) hours as needed. 12/10/17   Cristina GongHammond, Elizabeth W, PA-C  levETIRAcetam (KEPPRA) 1000 MG tablet Take 1,000 mg by mouth 2 (two) times daily.    [provider]  methocarbamol (ROBAXIN) 500 MG tablet Take 1 tablet (500 mg total) by mouth 2 (two) times daily. 11/13/17   Jaynie CrumbleKirichenko, Tatyana, PA-C    Family History Family History  Problem Relation Age of Onset  . Obesity Mother   . Diabetes Mother   . Hypertension Mother   . Depression Mother   . Suicidality Mother   . Diabetes Father   . Hypertension Father   . Heart attack Father     Social History Social History   Tobacco Use  . Smoking status: Never Smoker  . Smokeless tobacco: Never Used  Substance Use Topics  .  Alcohol use: Yes    Comment: Rare  . Drug use: No     Allergies   Compazine [prochlorperazine edisylate]; Sulfa antibiotics; Aripiprazole; Benadryl [diphenhydramine hcl]; Ketorolac tromethamine; Naprosyn [naproxen]; Nsaids; Penicillins; and Toradol [ketorolac tromethamine]   Review of Systems Review of Systems  All other systems reviewed and are negative.    Physical Exam Updated Vital Signs BP 132/85 (BP Location: Left Arm)   Pulse 87   Temp 99 F (37.2 C) (Oral)   SpO2 98%   Physical Exam  Constitutional: She is oriented to person, place, and time. She appears well-developed and well-nourished.  HENT:  Head: Normocephalic and atraumatic.  Eyes: Pupils are equal, round, and reactive to light. Conjunctivae are normal. Right eye exhibits no discharge. Left eye exhibits no discharge. No scleral icterus.  Neck: Normal range of motion. No JVD present. No tracheal deviation present.  Pulmonary/Chest: Effort normal. No stridor.  Musculoskeletal:  No CT or L-spine  tenderness, exquisite tenderness palpation in her right lower lumbar musculature, straight leg positive right, distal sensation strength and motor function intact-antalgic gait  Neurological: She is alert and oriented to person, place, and time. Coordination normal.  Psychiatric: She has a normal mood and affect. Her behavior is normal. Judgment and thought content normal.  Nursing note and vitals reviewed.    ED Treatments / Results  Labs (all labs ordered are listed, but only abnormal results are displayed) Labs Reviewed - No data to display  EKG None  Radiology No results found.  Procedures Procedures (including critical care time)  Medications Ordered in ED Medications  cyclobenzaprine (FLEXERIL) tablet 5 mg (has no administration in time range)  acetaminophen (TYLENOL) tablet 650 mg (has no administration in time range)     Initial Impression / Assessment and Plan / ED Course  I have reviewed the triage vital signs and the nursing notes.  Pertinent labs & imaging results that were available during my care of the patient were reviewed by me and considered in my medical decision making (see chart for details).     36 year old female presents today with low back pain likely muscular, no neurological deficits, no red flags.  No imaging indicated at this time, symptomatic care instructions given, strict return precautions given.  Patient verbalized understanding and agreement to today's plan had no further questions or concerns.  Final Clinical Impressions(s) / ED Diagnoses   Final diagnoses:  Acute right-sided low back pain with right-sided sciatica    ED Discharge Orders        Ordered    cyclobenzaprine (FLEXERIL) 10 MG tablet  2 times daily PRN     01/14/18 1613       Eyvonne Mechanic, PA-C 01/14/18 1617    Bethann Berkshire, MD 01/17/18 1006

## 2018-01-14 NOTE — Discharge Instructions (Addendum)
Please read attached information. If you experience any new or worsening signs or symptoms please return to the emergency room for evaluation. Please follow-up with your primary care provider or specialist as discussed. Please use medication prescribed only as directed and discontinue taking if you have any concerning signs or symptoms.   °

## 2018-01-14 NOTE — ED Notes (Signed)
Pt verbalizes understanding of d/c instructions. Pt received prescriptions. Pt taken to lobby in wheelchair at d/c with all belongings and with family.   

## 2018-01-18 ENCOUNTER — Emergency Department (HOSPITAL_COMMUNITY)
Admission: EM | Admit: 2018-01-18 | Discharge: 2018-01-18 | Disposition: A | Discharge status: Home / Self Care | Payer: Self-pay | Attending: Emergency Medicine | Admitting: Emergency Medicine

## 2018-01-18 ENCOUNTER — Encounter (HOSPITAL_COMMUNITY): Payer: Self-pay | Admitting: *Deleted

## 2018-01-18 ENCOUNTER — Other Ambulatory Visit: Payer: Self-pay

## 2018-01-18 DIAGNOSIS — T1491XA Suicide attempt, initial encounter: Secondary | ICD-10-CM

## 2018-01-18 DIAGNOSIS — Y999 Unspecified external cause status: Secondary | ICD-10-CM | POA: Insufficient documentation

## 2018-01-18 DIAGNOSIS — X838XXA Intentional self-harm by other specified means, initial encounter: Secondary | ICD-10-CM | POA: Insufficient documentation

## 2018-01-18 DIAGNOSIS — F331 Major depressive disorder, recurrent, moderate: Secondary | ICD-10-CM | POA: Diagnosis present

## 2018-01-18 DIAGNOSIS — F339 Major depressive disorder, recurrent, unspecified: Secondary | ICD-10-CM | POA: Insufficient documentation

## 2018-01-18 DIAGNOSIS — F430 Acute stress reaction: Secondary | ICD-10-CM | POA: Insufficient documentation

## 2018-01-18 DIAGNOSIS — Y939 Activity, unspecified: Secondary | ICD-10-CM | POA: Insufficient documentation

## 2018-01-18 DIAGNOSIS — Y92003 Bedroom of unspecified non-institutional (private) residence as the place of occurrence of the external cause: Secondary | ICD-10-CM | POA: Insufficient documentation

## 2018-01-18 LAB — RAPID URINE DRUG SCREEN, HOSP PERFORMED
AMPHETAMINES: NOT DETECTED
BENZODIAZEPINES: POSITIVE — AB
Cocaine: NOT DETECTED
OPIATES: NOT DETECTED
TETRAHYDROCANNABINOL: NOT DETECTED

## 2018-01-18 LAB — CBC
HCT: 37.4 % (ref 36.0–46.0)
HEMOGLOBIN: 12.2 g/dL (ref 12.0–15.0)
MCH: 27.9 pg (ref 26.0–34.0)
MCHC: 32.6 g/dL (ref 30.0–36.0)
MCV: 85.4 fL (ref 78.0–100.0)
PLATELETS: 199 10*3/uL (ref 150–400)
RBC: 4.38 MIL/uL (ref 3.87–5.11)
RDW: 13.4 % (ref 11.5–15.5)
WBC: 8.6 10*3/uL (ref 4.0–10.5)

## 2018-01-18 LAB — COMPREHENSIVE METABOLIC PANEL
ALK PHOS: 54 U/L (ref 38–126)
ALT: 11 U/L (ref 0–44)
AST: 17 U/L (ref 15–41)
Albumin: 3.9 g/dL (ref 3.5–5.0)
Anion gap: 9 (ref 5–15)
BUN: 15 mg/dL (ref 6–20)
CALCIUM: 9 mg/dL (ref 8.9–10.3)
CHLORIDE: 108 mmol/L (ref 98–111)
CO2: 23 mmol/L (ref 22–32)
CREATININE: 1.14 mg/dL — AB (ref 0.44–1.00)
GFR calc Af Amer: >60 mL/min (ref >60)
Glucose, Bld: 114 mg/dL — ABNORMAL HIGH (ref 70–99)
Potassium: 3.9 mmol/L (ref 3.5–5.1)
SODIUM: 140 mmol/L (ref 135–145)
Total Bilirubin: 0.5 mg/dL (ref 0.3–1.2)
Total Protein: 6.8 g/dL (ref 6.5–8.1)

## 2018-01-18 LAB — I-STAT BETA HCG BLOOD, ED (MC, WL, AP ONLY): I-stat hCG, quantitative: <5 m[IU]/mL (ref <5)

## 2018-01-18 LAB — ACETAMINOPHEN LEVEL: Acetaminophen (Tylenol), Serum: 54 ug/mL — ABNORMAL HIGH (ref 10–30)

## 2018-01-18 LAB — ETHANOL: Alcohol, Ethyl (B): <10 mg/dL (ref <10)

## 2018-01-18 LAB — SALICYLATE LEVEL: Salicylate Lvl: <7 mg/dL (ref 2.8–30.0)

## 2018-01-18 NOTE — ED Notes (Signed)
TTS assessment in progress. 

## 2018-01-18 NOTE — ED Notes (Addendum)
Pt stated "I didn't want to hurt myself.  I just act on impulse.  I called him and told him to come home but he didn't come home."

## 2018-01-18 NOTE — BH Assessment (Signed)
[facility] Assessment Progress Note  Per [name], DO, this pt does not require psychiatric hospitalization at this time.  Pt is to be discharged from [facility] with recommendation to follow up with [facility].  This has been included in pt's discharge instructions.  Pt's nurse, Diane, has been notified.  Oather Muilenburg, MA Triage Specialist [phone]     
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
## 2018-01-18 NOTE — Discharge Instructions (Signed)
For your behavioral health needs you are advised to follow up with [facility].  New patients are seen at their walk-in clinic.  Walk-in hours are Monday - Friday from 8:00 am - 12:00 pm, and from 1:00 pm - 3:00 pm.  Walk-in patients are seen on a first come, first served basis, so try to arrive as early as possible for the best chance of being seen the same day.  There is an initial fee of $22.50: ° °     [facility] °     [address] °     [place], [place] [address] °     [phone] °

## 2018-01-18 NOTE — ED Notes (Signed)
Bed: [id number] Expected date:  Expected time:  Means of arrival:  Comments: 37 yo F/SI Attempt

## 2018-01-18 NOTE — ED Notes (Signed)
Pt discharged home. Discharged instructions read to pt who verbalized understanding. All belongings returned to pt who signed for same. Denies SI/HI, is not delusional and not responding to internal stimuli. Escorted pt to the ED exit.    

## 2018-01-18 NOTE — BHH Suicide Risk Assessment (Cosign Needed)
Suicide Risk Assessment  Discharge Assessment   [facility]   Principal Problem: Major depressive disorder, recurrent episode, moderate (HCC) Discharge Diagnoses:  Patient Active Problem List   Diagnosis Date Noted  . Overdose [T50.901A] 11/24/2016  . Elevated serum creatinine [R79.89] 11/24/2016  . Seizure disorder (HCC) [G40.909] 08/24/2016  . Intractable chronic migraine without aura and with status migrainosus [G43.711] 01/24/2016  . Major depressive disorder, recurrent episode, moderate (HCC) [F33.1] 09/13/2012   Pt was seen and chart reviewed with treatment team and Dr Sharma CovertNorman. Pt denies suicidal/homicidal ideation, denies auditory/visual hallucinations and does not appear to be responding to internal stimuli. Pt stated she has severe anxiety and does not like to be alone. Her husband stated he wanted some alone time and she wanted him back home so she put a belt around her neck in an attempt to get his attention. Pt stated she was not trying to harm herself. Pt is able to contract for safety upon discharge. Pt is stable and psychiatrically clear for discharge.   Total Time spent with patient: 30 minutes  Musculoskeletal: Strength & Muscle Tone: within normal limits Gait & Station: normal Patient leans: N/A  Psychiatric Specialty Exam:   Blood pressure 124/82, pulse 79, temperature 98.1 F (36.7 C), temperature source Oral, resp. rate 19, height 5\' 8"  (1.727 m), weight (!) 305 lb (138.3 kg), last menstrual period 01/02/2018, SpO2 99 %.Body mass index is 46.38 kg/m.  General Appearance: Casual  Eye Contact::  Good  Speech:  Clear and Coherent and Normal Rate409  Volume:  Normal  Mood:  Anxious and Depressed  Affect:  Congruent and Depressed  Thought Process:  Coherent, Goal Directed and Linear  Orientation:  Full (Time, Place, and Person)  Thought Content:  Logical  Suicidal Thoughts:  No  Homicidal Thoughts:  No  Memory:  Immediate;    Good Recent;   Good Remote;   Fair  Judgement:  Poor  Insight:  Shallow  Psychomotor Activity:  Normal  Concentration:  Good  Recall:  Good  Fund of Knowledge:Good  Language: Good  Akathisia:  No  Handed:  Right  AIMS (if indicated):     Assets:  MusicianCommunication Skills Financial Resources/Insurance Housing Physical Health Social Support Transportation Vocational/Educational  Sleep:     Cognition: WNL  ADL's:  Intact   Mental Status Per Nursing Assessment::   On Admission:     Demographic Factors:  Female, Caucasian, Low socioeconomic status and Unemployed  Loss Factors: Financial problems/change in socioeconomic status  Historical Factors: Impulsivity  Risk Reduction Factors:   Responsible for children under 38 years of age, Sense of responsibility to family and Living with another person, especially a relative  Continued Clinical Symptoms:  Depression:   Impulsivity  Cognitive Features That Contribute To Risk:  Closed-mindedness    Suicide Risk:  Minimal: No identifiable suicidal ideation.  Patients presenting with no risk factors but with morbid ruminations; may be classified as minimal risk based on the severity of the depressive symptoms    Plan Of Care/Follow-up recommendations:  Activity:  as tolerated Diet:  heartt healthy  Laveda AbbeLaurie Britton Parks, NP 01/18/2018, 12:12 PM

## 2018-01-18 NOTE — ED Triage Notes (Signed)
Per [facility], pt called husband stating she wanted to kill herself.  Upon his arrival, he found her with a belt around her neck, unresponsive, coughing.

## 2018-01-18 NOTE — ED Triage Notes (Signed)
Pt also received Midazolam 5 mg IM PTA.

## 2018-01-23 ENCOUNTER — Encounter (HOSPITAL_COMMUNITY): Payer: Self-pay

## 2018-01-23 ENCOUNTER — Other Ambulatory Visit: Payer: Self-pay

## 2018-01-23 ENCOUNTER — Emergency Department (HOSPITAL_COMMUNITY): Payer: Self-pay

## 2018-01-23 ENCOUNTER — Emergency Department (HOSPITAL_COMMUNITY)
Admission: EM | Admit: 2018-01-23 | Discharge: 2018-01-23 | Disposition: A | Discharge status: Home / Self Care | Payer: Self-pay | Attending: Emergency Medicine | Admitting: Emergency Medicine

## 2018-01-23 DIAGNOSIS — M25511 Pain in right shoulder: Secondary | ICD-10-CM | POA: Insufficient documentation

## 2018-01-23 DIAGNOSIS — J45909 Unspecified asthma, uncomplicated: Secondary | ICD-10-CM | POA: Insufficient documentation

## 2018-01-23 DIAGNOSIS — Z79899 Other long term (current) drug therapy: Secondary | ICD-10-CM | POA: Insufficient documentation

## 2018-01-23 MED ORDER — MORPHINE SULFATE (PF) 4 MG/ML IV SOLN
4.0000 mg | Freq: Once | INTRAVENOUS | Status: AC
  Administered 2018-01-23: 4 mg via INTRAVENOUS
  Filled 2018-01-23: qty 1

## 2018-01-23 MED ORDER — ONDANSETRON HCL 4 MG/2ML IJ SOLN
4.0000 mg | Freq: Once | INTRAMUSCULAR | Status: AC
Start: 2018-01-23 — End: 2018-01-23
  Administered 2018-01-23: 4 mg via INTRAVENOUS
  Filled 2018-01-23: qty 2

## 2018-01-23 MED ORDER — HYDROCODONE-ACETAMINOPHEN 5-325 MG PO TABS: 1.0000 | ORAL_TABLET | ORAL | 0 refills | Status: DC | PRN

## 2018-01-23 NOTE — ED Notes (Signed)
Patient transported to X-ray 

## 2018-01-23 NOTE — ED Provider Notes (Signed)
[facility]-EMERGENCY DEPT Provider Note   CSN: [id number] Arrival date & time: 01/23/18  1745     History   Chief Complaint Chief Complaint  Patient presents with  . Shoulder Pain    HPI Victoria Middleton is a 37 y.o. female.  Pt presents to the ED today with a possible right shoulder dislocation.  Pt said it has dislocated in the past, but it went back in without any trips to the ED.  Today, she was moving an box mattress and it slid, hitting her right shoulder.  She thought it dislocated again.  No other injuries.     Past Medical History:  Diagnosis Date  . Asthma   . Depression   . Drug-seeking behavior   . Headache(784.0)   . Kidney stones   . Migraine   . Narcotic abuse (HCC)   . Seizures (HCC)   . Suicide attempt [facility](HCC)     Patient Active Problem List   Diagnosis Date Noted  . Overdose 11/24/2016  . Elevated serum creatinine 11/24/2016  . Seizure disorder (HCC) 08/24/2016  . Intractable chronic migraine without aura and with status migrainosus 01/24/2016  . Major depressive disorder, recurrent episode, moderate (HCC) 09/13/2012    Past Surgical History:  Procedure Laterality Date  . ANKLE SURGERY     left  . ANTERIOR CERVICAL DECOMP/DISCECTOMY FUSION  2018  . APPENDECTOMY    . CHOLECYSTECTOMY    . KNEE SURGERY    . TONSILLECTOMY       OB History    Gravida  0   Para      Term      Preterm      AB      Living        SAB      TAB      Ectopic      Multiple      Live Births               Home Medications    Prior to Admission medications   Medication Sig Start Date End Date Taking? Authorizing Provider  acetaminophen (TYLENOL) 500 MG tablet Take 1,000 mg by mouth every 4 (four) hours as needed for headache (pain).    Yes [provider]  cyclobenzaprine (FLEXERIL) 10 MG tablet Take 1 tablet (10 mg total) by mouth 2 (two) times daily as needed for muscle spasms. 01/14/18   Hedges, Tinnie GensJeffrey, PA-C    HYDROcodone-acetaminophen (NORCO/VICODIN) 5-325 MG tablet Take 1 tablet by mouth every 4 (four) hours as needed. 01/23/18   Jacalyn LefevreHaviland, Dai Mcadams, MD  methocarbamol (ROBAXIN) 500 MG tablet Take 1 tablet (500 mg total) by mouth 2 (two) times daily. Patient not taking: Reported on 01/23/2018 11/13/17   Jaynie CrumbleKirichenko, Tatyana, PA-C    Family History Family History  Problem Relation Age of Onset  . Obesity Mother   . Diabetes Mother   . Hypertension Mother   . Depression Mother   . Suicidality Mother   . Diabetes Father   . Hypertension Father   . Heart attack Father     Social History Social History   Tobacco Use  . Smoking status: Never Smoker  . Smokeless tobacco: Never Used  Substance Use Topics  . Alcohol use: Yes    Comment: Rare  . Drug use: No     Allergies   Compazine [prochlorperazine edisylate]; Sulfa antibiotics; Aripiprazole; Benadryl [diphenhydramine hcl]; Ketorolac tromethamine; Naprosyn [naproxen]; Nsaids; Penicillins; and Toradol [ketorolac tromethamine]   Review  of Systems Review of Systems  Musculoskeletal:       Right shoulder pain  All other systems reviewed and are negative.    Physical Exam Updated Vital Signs BP (!) 139/113 (BP Location: Left Arm)   Pulse 85   Temp 98.5 F (36.9 C) (Oral)   Resp 18   Ht 5\' 8"  (1.727 m)   Wt (!) 138.3 kg (305 lb)   LMP 01/21/2018   SpO2 100%   BMI 46.38 kg/m   Physical Exam  Constitutional: She is oriented to person, place, and time. She appears well-developed and well-nourished.  HENT:  Head: Normocephalic and atraumatic.  Right Ear: External ear normal.  Left Ear: External ear normal.  Nose: Nose normal.  Mouth/Throat: Oropharynx is clear and moist.  Eyes: Pupils are equal, round, and reactive to light. Conjunctivae and EOM are normal.  Neck: Normal range of motion. Neck supple.  Cardiovascular: Normal rate, regular rhythm, normal heart sounds and intact distal pulses.  Pulmonary/Chest: Effort normal and  breath sounds normal.  Abdominal: Soft. Bowel sounds are normal.  Musculoskeletal:       Right shoulder: She exhibits decreased range of motion and tenderness.  Neurological: She is alert and oriented to person, place, and time.  Skin: Skin is warm. Capillary refill takes less than 2 seconds.  Psychiatric: She has a normal mood and affect. Her behavior is normal. Judgment and thought content normal.  Nursing note and vitals reviewed.    ED Treatments / Results  Labs (all labs ordered are listed, but only abnormal results are displayed) Labs Reviewed - No data to display  EKG None  Radiology Dg Shoulder Right  Result Date: 01/23/2018 CLINICAL DATA:  Initial evaluation for acute injury, pain. EXAM: RIGHT SHOULDER - 2+ VIEW COMPARISON:  None. FINDINGS: There is no evidence of fracture or dislocation. There is no evidence of arthropathy or other focal bone abnormality. Soft tissues are unremarkable. IMPRESSION: Negative. Electronically Signed   By: Rise Mu M.D.   On: 01/23/2018 19:49    Procedures Procedures (including critical care time)  Medications Ordered in ED Medications  ondansetron (ZOFRAN) injection 4 mg (4 mg Intravenous Given 01/23/18 1852)  morphine 4 MG/ML injection 4 mg (4 mg Intravenous Given 01/23/18 1851)     Initial Impression / Assessment and Plan / ED Course  I have reviewed the triage vital signs and the nursing notes.  Pertinent labs & imaging results that were available during my care of the patient were reviewed by me and considered in my medical decision making (see chart for details).    No dislocation on xray.  Pt to be placed in a sling and instructed to f/u with ortho.  Return if worse.  Final Clinical Impressions(s) / ED Diagnoses   Final diagnoses:  Acute pain of right shoulder    ED Discharge Orders        Ordered    HYDROcodone-acetaminophen (NORCO/VICODIN) 5-325 MG tablet  Every 4 hours PRN     01/23/18 2002         Jacalyn Lefevre, MD 01/23/18 2004

## 2018-01-23 NOTE — ED Notes (Signed)
ED Provider at bedside. 

## 2018-01-23 NOTE — ED Triage Notes (Signed)
Patient reports that she was moving a box mattress and the mattress slid causing the right shoulder to pull downward.

## 2018-02-16 ENCOUNTER — Encounter (HOSPITAL_COMMUNITY): Payer: Self-pay | Admitting: *Deleted

## 2018-02-16 ENCOUNTER — Emergency Department (HOSPITAL_COMMUNITY)
Admission: EM | Admit: 2018-02-16 | Discharge: 2018-02-17 | Disposition: A | Discharge status: Home / Self Care | Payer: Self-pay | Attending: Emergency Medicine | Admitting: Emergency Medicine

## 2018-02-16 ENCOUNTER — Other Ambulatory Visit: Payer: Self-pay

## 2018-02-16 ENCOUNTER — Emergency Department (HOSPITAL_COMMUNITY): Payer: Self-pay

## 2018-02-16 DIAGNOSIS — J45909 Unspecified asthma, uncomplicated: Secondary | ICD-10-CM | POA: Insufficient documentation

## 2018-02-16 DIAGNOSIS — M25511 Pain in right shoulder: Secondary | ICD-10-CM | POA: Insufficient documentation

## 2018-02-16 DIAGNOSIS — R030 Elevated blood-pressure reading, without diagnosis of hypertension: Secondary | ICD-10-CM | POA: Insufficient documentation

## 2018-02-16 LAB — POC URINE PREG, ED: Preg Test, Ur: NEGATIVE

## 2018-02-16 MED ORDER — MORPHINE SULFATE (PF) 4 MG/ML IV SOLN
4.0000 mg | Freq: Once | INTRAVENOUS | Status: AC
  Administered 2018-02-16: 4 mg via INTRAMUSCULAR
  Filled 2018-02-16: qty 1

## 2018-02-16 MED ORDER — METHOCARBAMOL 500 MG PO TABS
500.0000 mg | ORAL_TABLET | Freq: Once | ORAL | Status: AC
  Administered 2018-02-16: 500 mg via ORAL
  Filled 2018-02-16: qty 1

## 2018-02-16 MED ORDER — METHOCARBAMOL 500 MG PO TABS: 500.0000 mg | ORAL_TABLET | Freq: Two times a day (BID) | ORAL | 0 refills | Status: DC

## 2018-02-16 NOTE — Discharge Instructions (Signed)
It was my pleasure taking care of you today!  Tylenol as needed for pain. Ice shoulder throughout the day (instructions below).   You are prescribed Robaxin, a muscle relaxant. Some common side effects of this medication include:  Feeling sleepy.  Dizziness. Take care upon going from a seated to a standing position.  Dry mouth.  Feeling tired or weak.  Hard stools (constipation).  Upset stomach. These are not all of the side effects that may occur. If you have questions about side effects, call your doctor. Call your primary care provider for medical advice about side effects.  This medication can be sedating. Only take this medication as needed. Please do not combine with alcohol. Do not drive or operate machinery while taking this medication.   This medication can interact with some other medications. Make sure to tell any provider you are taking this medication before they prescribe you a new medication.   Wear shoulder sling for no more than 3 days, then begin performing gentle range of motion exercises.   Call the orthopedist listed today or tomorrow to schedule a follow up appointment for recheck of ongoing shoulder pain in 1-2 weeks that can be canceled with a 24-48 hour notice if complete resolution of pain.   Call the orthopedist listed if symptoms are not improved in one week.   Return to the ER for new or worsening symptoms, any additional concerns.  COLD THERAPY DIRECTIONS:  Ice or gel packs can be used to reduce both pain and swelling. Ice is the most helpful within the first 24 to 48 hours after an injury or flareup from overusing a muscle or joint.  Ice is effective, has very few side effects, and is safe for most people to use.   If you expose your skin to cold temperatures for too [place] or without the proper protection, you can damage your skin or nerves. Watch for signs of skin damage due to cold.   HOME CARE INSTRUCTIONS  Follow these tips to use ice and cold packs  safely.  Place a dry or damp towel between the ice and skin. A damp towel will cool the skin more quickly, so you may need to shorten the time that the ice is used.  For a more rapid response, add gentle compression to the ice.  Ice for no more than 10 to 20 minutes at a time. The bonier the area you are icing, the less time it will take to get the benefits of ice.  Check your skin after 5 minutes to make sure there are no signs of a poor response to cold or skin damage.  Rest 20 minutes or more in between uses.  Once your skin is numb, you can end your treatment. You can test numbness by very lightly touching your skin. The touch should be so light that you do not see the skin dimple from the pressure of your fingertip. When using ice, most people will feel these normal sensations in this order: cold, burning, aching, and numbness.

## 2018-02-16 NOTE — ED Notes (Signed)
Patient stating she has an immobilizer at home and would like to use that one.

## 2018-02-16 NOTE — ED Triage Notes (Signed)
Pt was picking up juice and feels like she dislocated her right shoulder. Previous history of the same. No meds PTA.

## 2018-02-16 NOTE — ED Notes (Signed)
Patient transported to X-ray 

## 2018-02-16 NOTE — ED Provider Notes (Signed)
[facility]-EMERGENCY DEPT Provider Note   CSN: [id number] Arrival date & time: 02/16/18  2141     History   Chief Complaint Chief Complaint  Patient presents with  . Shoulder Pain    HPI Victoria Middleton is a 37 y.o. female.  HPI   Patient is a 37 year old female with a history of asthma, depression, headaches, presenting for right shoulder pain.  Patient reports that she felt that it subluxed while she was lifting a 64 ounce jug of juice.  Patient reports that she has recurrent history of subluxation of the right shoulder.  Patient reports at present, she cannot lift it higher than 90 degrees in any direction.  Patient denies any loss of sensation or weakness of her hand.  Patient reports that she has not seen any orthopedic physician regarding recurrent shoulder dislocations. Ice applied prior to arrival for symptoms.   Past Medical History:  Diagnosis Date  . Asthma   . Depression   . Drug-seeking behavior   . Headache(784.0)   . Kidney stones   . Migraine   . Narcotic abuse (HCC)   . Seizures (HCC)   . Suicide attempt [facility](HCC)     Patient Active Problem List   Diagnosis Date Noted  . Overdose 11/24/2016  . Elevated serum creatinine 11/24/2016  . Seizure disorder (HCC) 08/24/2016  . Intractable chronic migraine without aura and with status migrainosus 01/24/2016  . Major depressive disorder, recurrent episode, moderate (HCC) 09/13/2012    Past Surgical History:  Procedure Laterality Date  . ANKLE SURGERY     left  . ANTERIOR CERVICAL DECOMP/DISCECTOMY FUSION  2018  . APPENDECTOMY    . CHOLECYSTECTOMY    . KNEE SURGERY    . TONSILLECTOMY       OB History    Gravida  0   Para      Term      Preterm      AB      Living        SAB      TAB      Ectopic      Multiple      Live Births               Home Medications    Prior to Admission medications   Medication Sig Start Date End Date Taking? Authorizing Provider    acetaminophen (TYLENOL) 500 MG tablet Take 1,000 mg by mouth every 4 (four) hours as needed for headache (pain).     [provider]  cyclobenzaprine (FLEXERIL) 10 MG tablet Take 1 tablet (10 mg total) by mouth 2 (two) times daily as needed for muscle spasms. 01/14/18   Hedges, Tinnie GensJeffrey, PA-C  HYDROcodone-acetaminophen (NORCO/VICODIN) 5-325 MG tablet Take 1 tablet by mouth every 4 (four) hours as needed. 01/23/18   Jacalyn LefevreHaviland, Julie, MD  methocarbamol (ROBAXIN) 500 MG tablet Take 1 tablet (500 mg total) by mouth 2 (two) times daily. Patient not taking: Reported on 01/23/2018 11/13/17   Jaynie CrumbleKirichenko, Tatyana, PA-C    Family History Family History  Problem Relation Age of Onset  . Obesity Mother   . Diabetes Mother   . Hypertension Mother   . Depression Mother   . Suicidality Mother   . Diabetes Father   . Hypertension Father   . Heart attack Father     Social History Social History   Tobacco Use  . Smoking status: Never Smoker  . Smokeless tobacco: Never Used  Substance Use Topics  .  Alcohol use: Yes    Comment: Rare  . Drug use: No     Allergies   Compazine [prochlorperazine edisylate]; Sulfa antibiotics; Aripiprazole; Benadryl [diphenhydramine hcl]; Ketorolac tromethamine; Naprosyn [naproxen]; Nsaids; Penicillins; and Toradol [ketorolac tromethamine]   Review of Systems Review of Systems  Musculoskeletal: Positive for arthralgias and myalgias.  Skin: Negative for color change.  Neurological: Negative for syncope, weakness and numbness.  All other systems reviewed and are negative.    Physical Exam Updated Vital Signs BP (!) 132/99 (BP Location: Left Arm)   Pulse 84   Temp 98.9 F (37.2 C) (Oral)   Resp 18   Ht 5\' 8"  (1.727 m)   Wt (!) 140.6 kg (310 lb)   LMP 02/16/2018   SpO2 100%   BMI 47.14 kg/m   Physical Exam  Constitutional: She appears well-developed and well-nourished. No distress.  HENT:  Head: Normocephalic and atraumatic.  Mouth/Throat:  Oropharynx is clear and moist.  Eyes: Pupils are equal, round, and reactive to light. Conjunctivae and EOM are normal.  Neck: Normal range of motion. Neck supple.  Cardiovascular: Normal rate, regular rhythm, S1 normal and S2 normal.  No murmur heard. Intact, 2+ radial pulse on right.  Pulmonary/Chest: Effort normal and breath sounds normal. She has no wheezes. She has no rales.  Abdominal: She exhibits no distension.  Musculoskeletal:  Right shoulder with tenderness to palpation of glenohumeral joint as depicted in image. Decreased ROM. Cannot lift about 45 degrees in any direction passively. No swelling, erythema or ecchymosis present. No step-off, crepitus, or deformity appreciated. 5/5 muscle strength of UE. 2+ radial pulse, sensation intact and all compartments soft.  Lymphadenopathy:    She has no cervical adenopathy.  Neurological: She is alert.  Cranial nerves grossly intact. Patient moves extremities symmetrically and with good coordination.  Skin: Skin is warm and dry. No rash noted. No erythema.  Psychiatric: She has a normal mood and affect. Her behavior is normal. Judgment and thought content normal.  Nursing note and vitals reviewed.    ED Treatments / Results  Labs (all labs ordered are listed, but only abnormal results are displayed) Labs Reviewed  POC URINE PREG, ED    EKG None  Radiology Dg Shoulder Right  Result Date: 02/16/2018 CLINICAL DATA:  Shoulder pain EXAM: RIGHT SHOULDER - 2+ VIEW COMPARISON:  None. FINDINGS: There is no evidence of fracture or dislocation. There is no evidence of arthropathy or other focal bone abnormality. Soft tissues are unremarkable. IMPRESSION: Negative. Electronically Signed   By: Kennith Center M.D.   On: 02/16/2018 22:53    Procedures Procedures (including critical care time)  Medications Ordered in ED Medications  morphine 4 MG/ML injection 4 mg (4 mg Intramuscular Given 02/16/18 2321)  methocarbamol (ROBAXIN) tablet 500 mg  (500 mg Oral Given 02/16/18 2321)     Initial Impression / Assessment and Plan / ED Course  I have reviewed the triage vital signs and the nursing notes.  Pertinent labs & imaging results that were available during my care of the patient were reviewed by me and considered in my medical decision making (see chart for details).  Clinical Course as of Feb 16 2341  Sat Feb 16, 2018  2326 Reassessed. Feeling well. No further pain control needed. Xray negative for subluxation.   [AM]    Clinical Course User Index [AM] Elisha Ponder, PA-C    Patient nontoxic-appearing neurovascularly intact in the right upper extremity.  X-ray demonstrates no subluxation.  Unclear if this  happened prior to arrival.  Patient placed in shoulder immobilizer and instructed not he is a greater than 72 hours.  I stressed the importance of follow-up with orthopedics, if this occurs repeatedly.  Patient was given return precautions for any increasing pain, pallor, or paresthesias in the right upper extremity.  Patient is in understanding and agrees with the plan of care.  Final Clinical Impressions(s) / ED Diagnoses   Final diagnoses:  Acute pain of right shoulder  Elevated blood pressure reading without diagnosis of hypertension    ED Discharge Orders        Ordered    methocarbamol (ROBAXIN) 500 MG tablet  2 times daily     02/16/18 2343       Delia Chimes 02/16/18 2346    Shaune Pollack, MD 02/16/18 2351

## 2018-04-05 ENCOUNTER — Encounter (HOSPITAL_COMMUNITY): Payer: Self-pay

## 2018-04-05 ENCOUNTER — Emergency Department (HOSPITAL_COMMUNITY): Payer: Self-pay

## 2018-04-05 ENCOUNTER — Other Ambulatory Visit: Payer: Self-pay

## 2018-04-05 ENCOUNTER — Emergency Department (HOSPITAL_COMMUNITY)
Admission: EM | Admit: 2018-04-05 | Discharge: 2018-04-05 | Disposition: A | Discharge status: Home / Self Care | Payer: Self-pay | Attending: Emergency Medicine | Admitting: Emergency Medicine

## 2018-04-05 DIAGNOSIS — M79641 Pain in right hand: Secondary | ICD-10-CM | POA: Insufficient documentation

## 2018-04-05 DIAGNOSIS — Y999 Unspecified external cause status: Secondary | ICD-10-CM | POA: Insufficient documentation

## 2018-04-05 DIAGNOSIS — Z79899 Other long term (current) drug therapy: Secondary | ICD-10-CM | POA: Insufficient documentation

## 2018-04-05 DIAGNOSIS — W228XXA Striking against or struck by other objects, initial encounter: Secondary | ICD-10-CM | POA: Insufficient documentation

## 2018-04-05 DIAGNOSIS — J45909 Unspecified asthma, uncomplicated: Secondary | ICD-10-CM | POA: Insufficient documentation

## 2018-04-05 DIAGNOSIS — Y9383 Activity, rough housing and horseplay: Secondary | ICD-10-CM | POA: Insufficient documentation

## 2018-04-05 DIAGNOSIS — Y929 Unspecified place or not applicable: Secondary | ICD-10-CM | POA: Insufficient documentation

## 2018-04-05 NOTE — Discharge Instructions (Signed)

## 2018-04-05 NOTE — ED Triage Notes (Signed)
Patient states she was horse playing with her husband and slammed her right hand into the counter. Patient has swelling and pain.

## 2018-04-05 NOTE — ED Provider Notes (Signed)
[facility]-EMERGENCY DEPT Provider Note   CSN: [id number] Arrival date & time: 04/05/18  1558     History   Chief Complaint Chief Complaint  Patient presents with  . hand swelling    HPI [facility] is a 37 y.o. female.  HPI   Patient is a 37 year old female with history of asthma, depression, drug-seeking behavior, headaches, kidney stones, migraine, narcotic abuse, seizures, suicide attempt who presents emergency department today for evaluation of right hand pain that began suddenly earlier today.  Patient states that she was horse playing with her husband and slammed her right hand onto the counter.  She then developed swelling and pain to the right hand.  States pain is severe and worse with with movement and palpation.  Denies any exacerbating or alleviating factors.  Has not tried any medications for her symptoms.  Past Medical History:  Diagnosis Date  . Asthma   . Depression   . Drug-seeking behavior   . Headache(784.0)   . Kidney stones   . Migraine   . Narcotic abuse (HCC)   . Seizures (HCC)   . Suicide attempt [facility])     Patient Active Problem List   Diagnosis Date Noted  . Overdose 11/24/2016  . Elevated serum creatinine 11/24/2016  . Seizure disorder (HCC) 08/24/2016  . Intractable chronic migraine without aura and with status migrainosus 01/24/2016  . Major depressive disorder, recurrent episode, moderate (HCC) 09/13/2012    Past Surgical History:  Procedure Laterality Date  . ANKLE SURGERY     left  . ANTERIOR CERVICAL DECOMP/DISCECTOMY FUSION  2018  . APPENDECTOMY    . CHOLECYSTECTOMY    . KNEE SURGERY    . TONSILLECTOMY       OB History    Gravida  0   Para      Term      Preterm      AB      Living        SAB      TAB      Ectopic      Multiple      Live Births               Home Medications    Prior to Admission medications   Medication Sig Start Date End Date Taking? Authorizing  Provider  acetaminophen (TYLENOL) 500 MG tablet Take 1,000 mg by mouth every 4 (four) hours as needed for headache (pain).     [provider]  cyclobenzaprine (FLEXERIL) 10 MG tablet Take 1 tablet (10 mg total) by mouth 2 (two) times daily as needed for muscle spasms. 01/14/18   Hedges, Tinnie Gens, PA-C  HYDROcodone-acetaminophen (NORCO/VICODIN) 5-325 MG tablet Take 1 tablet by mouth every 4 (four) hours as needed. 01/23/18   Jacalyn Lefevre, MD  methocarbamol (ROBAXIN) 500 MG tablet Take 1 tablet (500 mg total) by mouth 2 (two) times daily. 02/16/18   Elisha Ponder, PA-C    Family History Family History  Problem Relation Age of Onset  . Obesity Mother   . Diabetes Mother   . Hypertension Mother   . Depression Mother   . Suicidality Mother   . Diabetes Father   . Hypertension Father   . Heart attack Father     Social History Social History   Tobacco Use  . Smoking status: Never Smoker  . Smokeless tobacco: Never Used  Substance Use Topics  . Alcohol use: Yes    Comment: Rare  .  Drug use: No     Allergies   Compazine [prochlorperazine edisylate]; Sulfa antibiotics; Aripiprazole; Benadryl [diphenhydramine hcl]; Ketorolac tromethamine; Naprosyn [naproxen]; Nsaids; Penicillins; and Toradol [ketorolac tromethamine]   Review of Systems Review of Systems  Constitutional: Negative for chills and fever.  Musculoskeletal:       Right hand pain  Neurological: Negative for weakness and numbness.     Physical Exam Updated Vital Signs BP 139/75 (BP Location: Left Arm)   Pulse 79   Temp 99.1 F (37.3 C) (Oral)   Resp 18   Ht 5\' 8"  (1.727 m)   Wt 136.1 kg   LMP 03/22/2018 (Approximate)   SpO2 100%   BMI 45.61 kg/m   Physical Exam  Constitutional: She appears well-developed and well-nourished. No distress.  HENT:  Head: Normocephalic and atraumatic.  Eyes: Conjunctivae are normal.  Neck: Neck supple.  Cardiovascular: Normal rate.  Pulmonary/Chest: Effort normal.   Musculoskeletal: Normal range of motion.  TTP to the right 4th and 5th metacarpals. No obvious deformity.  Neurovascularly intact.  Decreased strength secondary to pain.  Flexion/extension grossly intact to fingers of right hand  Neurological: She is alert.  Skin: Skin is warm and dry.  Psychiatric: She has a normal mood and affect.  Nursing note and vitals reviewed.    ED Treatments / Results  Labs (all labs ordered are listed, but only abnormal results are displayed) Labs Reviewed - No data to display  EKG None  Radiology Dg Hand Complete Right  Result Date: 04/05/2018 CLINICAL DATA:  Hit countertop with hand. Pain and swelling in fourth and fifth metacarpals EXAM: RIGHT HAND - COMPLETE 3+ VIEW COMPARISON:  None. FINDINGS: There is no evidence of fracture or dislocation. There is no evidence of arthropathy or other focal bone abnormality. Soft tissues are unremarkable. IMPRESSION: Negative. Electronically Signed   By: Signa Kellaylor  Stroud M.D.   On: 04/05/2018 17:10    Procedures Procedures (including critical care time)  Medications Ordered in ED Medications - No data to display   Initial Impression / Assessment and Plan / ED Course  I have reviewed the triage vital signs and the nursing notes.  Pertinent labs & imaging results that were available during my care of the patient were reviewed by me and considered in my medical decision making (see chart for details).   Final Clinical Impressions(s) / ED Diagnoses   Final diagnoses:  Right hand pain   Patient presented complaining of right hand pain after she punched a countertop.  Has tenderness over the fourth and fifth metacarpals on the right hand with no obvious deformity.  Neurovascularly intact.  Range of motion is grossly intact though strength is decreased secondary to pain.  X-ray of the right hand ibuprofen and rice protocol for her symptoms.  Advised to follow-up with PCP and return to the ER for new or worsening  symptoms in the meantime.  Patient voiced understanding the plan reasons to return to the ED.  All questions answered.   ED Discharge Orders    None       Rayne DuCouture, Amanada Philbrick S, PA-C 04/05/18 1755    Loren RacerYelverton, David, MD 04/05/18 [phone]

## 2018-06-13 IMAGING — CT CT CERVICAL SPINE W/O CM
4 of 8 series · 12 of 33 positions shown, 13 images · non-contrast
Comparison: CT HEAD April 06, 2013

CLINICAL DATA: 10 minutes loss of consciousness while exiting a
people today. Neck pain and posterior headache. History of seizures.

EXAM:
CT HEAD WITHOUT CONTRAST
CT CERVICAL SPINE WITHOUT CONTRAST
TECHNIQUE: Multidetector CT imaging of the head and cervical spine was
performed following the standard protocol without intravenous
contrast. Multiplanar CT image reconstructions of the cervical spine
were also generated.

[Series 5: coronal · coronal · 0.30mm/px · 1 of 76 slices shown]
[im 38/76  bone]
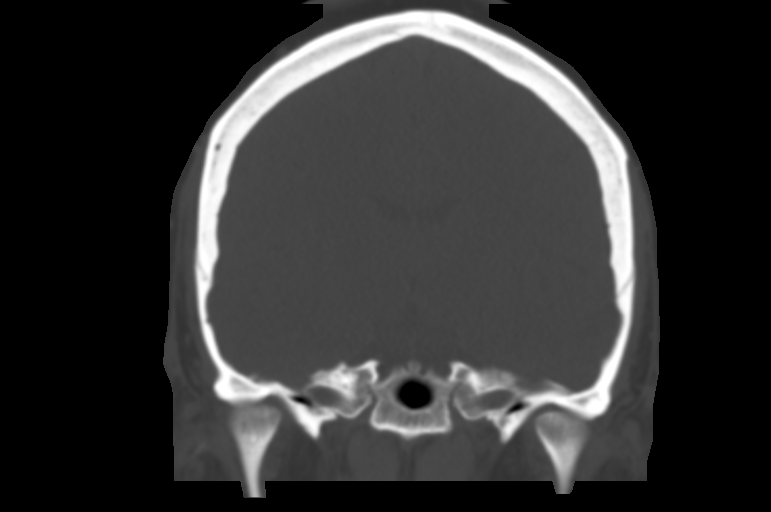

[Series 6: sagittal · sagittal · 0.30mm/px · 5 of 74 slices shown]
[im 13/74  bone]
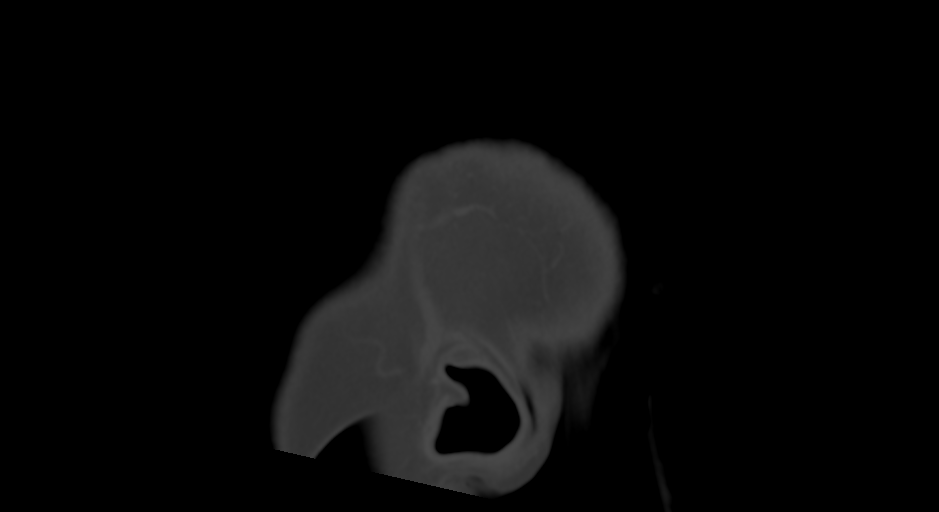
[im 25/74  bone]
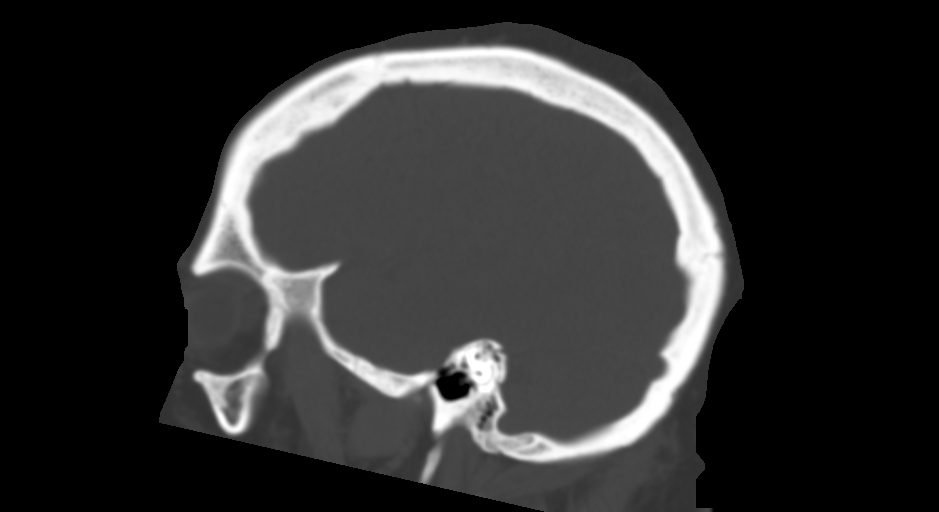
[im 37/74  bone]
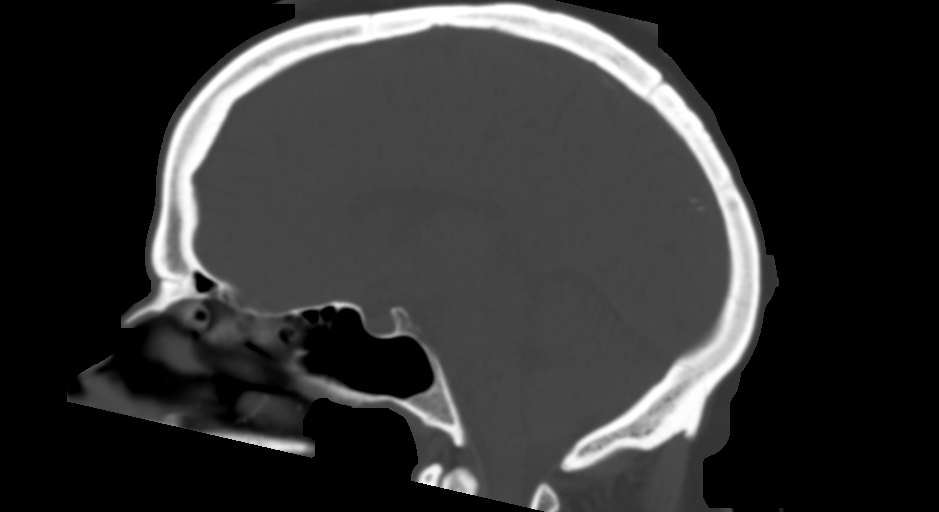
[im 49/74  bone]
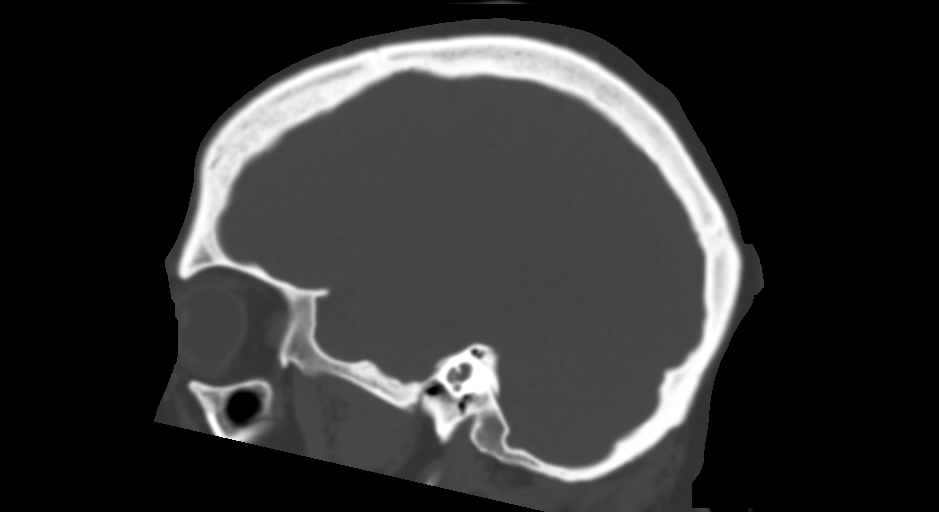
[im 61/74  bone]
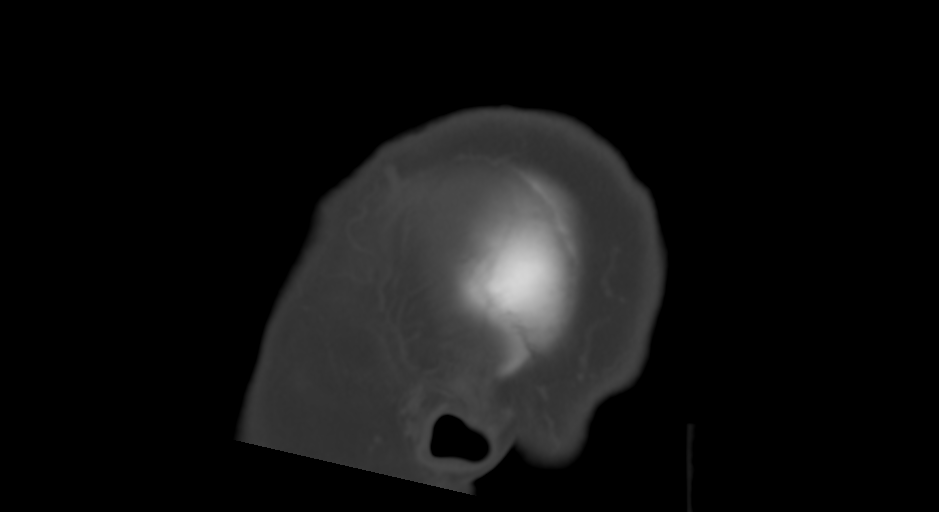

[Series 7: c-spine st · axial · 0.29mm/px · z∈[-309,-197]mm · 3 of 113 slices shown, 4 images]
[im 29/113  soft-tissue]
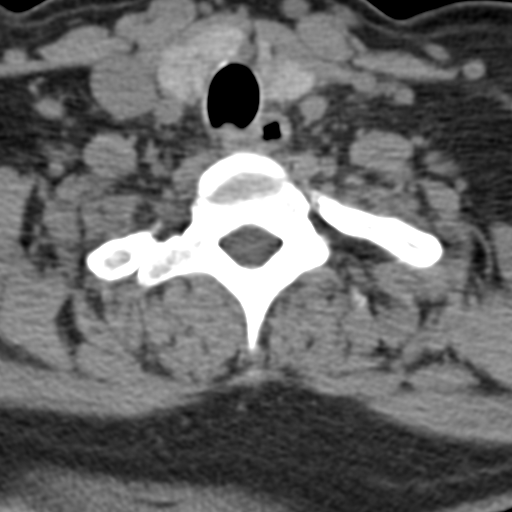
[im 29/113  bone]
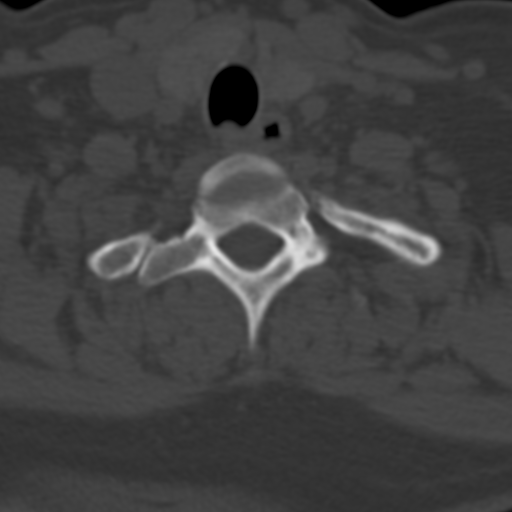
[im 57/113  bone]
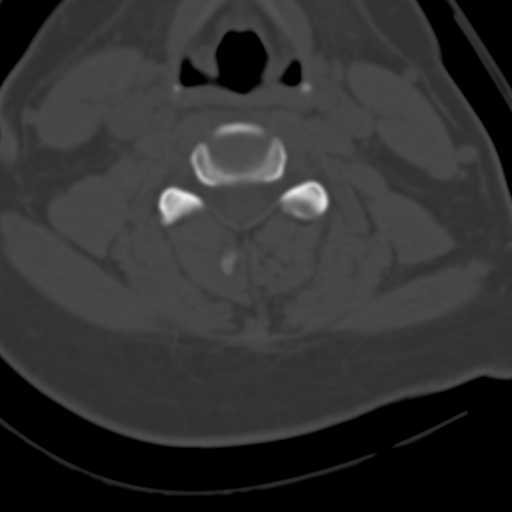
[im 85/113  bone]
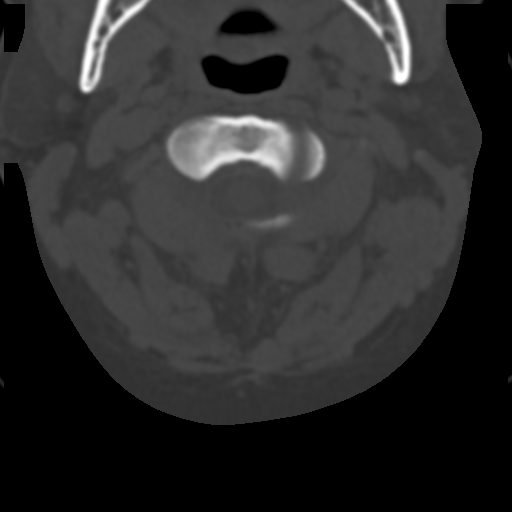

[Series 10: axial recon · axial · 0.23mm/px · z∈[-315,-204]mm · 3 of 112 slices shown]
[im 28/112  bone]
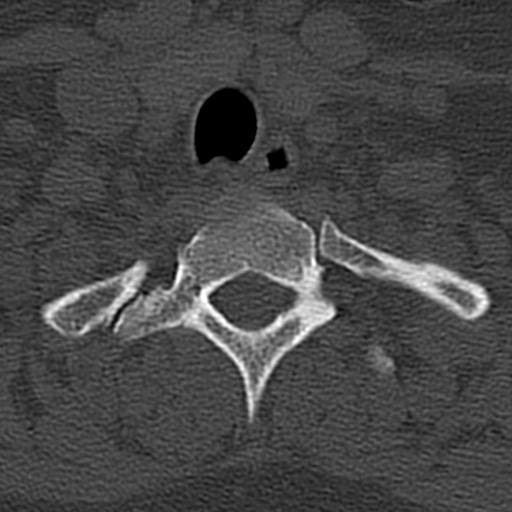
[im 56/112  bone]
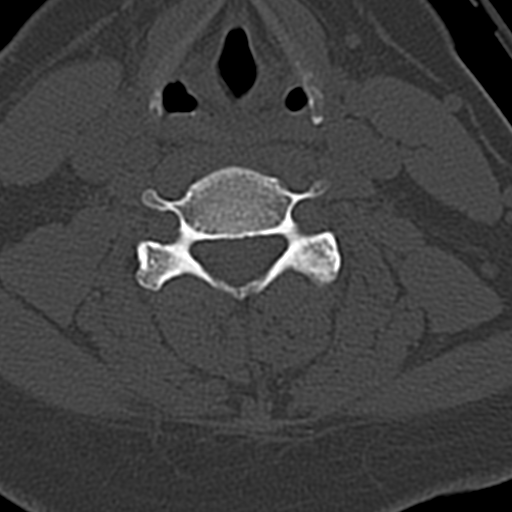
[im 84/112  bone]
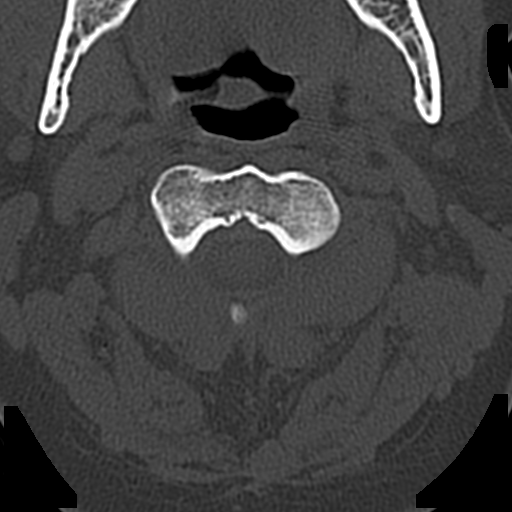

[12 of 33 positions shown; findings below may reference images not displayed]

FINDINGS: CT HEAD FINDINGS

BRAIN: No intraparenchymal hemorrhage, mass effect nor midline
shift. The ventricles and sulci are normal. No acute large vascular
territory infarcts. No abnormal extra-axial fluid collections. Basal
cisterns are patent.

VASCULAR: Unremarkable.

SKULL/SOFT TISSUES: No skull fracture. No significant soft tissue
swelling.

ORBITS/SINUSES: The included ocular globes and orbital contents are
normal.Status post FESS/ethmoidectomies.

OTHER: None.

CT CERVICAL SPINE FINDINGS

ALIGNMENT: Maintained lordosis. Vertebral bodies in alignment.

SKULL BASE AND VERTEBRAE: Cervical vertebral bodies and posterior
elements are intact. Status post C5-6 ACDF with arthrodesis. No
periprosthetic lucency. Intervertebral disc heights preserved. No
destructive bony lesions. C1-2 articulation maintained.

SOFT TISSUES AND SPINAL CANAL: Normal.

DISC LEVELS: No significant osseous canal stenosis or neural
foraminal narrowing.

UPPER CHEST: Lung apices are clear.

OTHER: None.
IMPRESSION: CT HEAD: Normal.

CT CERVICAL SPINE: No acute fracture or malalignment. C5-6 ACDF and
arthrodesis.

## 2018-07-18 ENCOUNTER — Emergency Department (HOSPITAL_COMMUNITY)
Admission: EM | Admit: 2018-07-18 | Discharge: 2018-07-18 | Disposition: A | Discharge status: Home / Self Care | Payer: Self-pay | Attending: Emergency Medicine | Admitting: Emergency Medicine

## 2018-07-18 ENCOUNTER — Emergency Department (HOSPITAL_COMMUNITY): Payer: Self-pay

## 2018-07-18 ENCOUNTER — Encounter (HOSPITAL_COMMUNITY): Payer: Self-pay | Admitting: *Deleted

## 2018-07-18 DIAGNOSIS — L988 Other specified disorders of the skin and subcutaneous tissue: Secondary | ICD-10-CM

## 2018-07-18 DIAGNOSIS — J45909 Unspecified asthma, uncomplicated: Secondary | ICD-10-CM | POA: Insufficient documentation

## 2018-07-18 DIAGNOSIS — L539 Erythematous condition, unspecified: Secondary | ICD-10-CM | POA: Insufficient documentation

## 2018-07-18 DIAGNOSIS — Z79899 Other long term (current) drug therapy: Secondary | ICD-10-CM | POA: Insufficient documentation

## 2018-07-18 DIAGNOSIS — R059 Cough, unspecified: Secondary | ICD-10-CM

## 2018-07-18 DIAGNOSIS — R69 Illness, unspecified: Secondary | ICD-10-CM

## 2018-07-18 DIAGNOSIS — R05 Cough: Secondary | ICD-10-CM

## 2018-07-18 DIAGNOSIS — N649 Disorder of breast, unspecified: Secondary | ICD-10-CM

## 2018-07-18 DIAGNOSIS — J111 Influenza due to unidentified influenza virus with other respiratory manifestations: Secondary | ICD-10-CM | POA: Insufficient documentation

## 2018-07-18 MED ORDER — OSELTAMIVIR PHOSPHATE 75 MG PO CAPS: 75.0000 mg | ORAL_CAPSULE | Freq: Two times a day (BID) | ORAL | 0 refills | Status: DC

## 2018-07-18 MED ORDER — BENZONATATE 100 MG PO CAPS: 100.0000 mg | ORAL_CAPSULE | Freq: Three times a day (TID) | ORAL | 0 refills | Status: DC

## 2018-07-18 NOTE — ED Notes (Signed)
Pt states she will need a work note.

## 2018-07-18 NOTE — ED Notes (Signed)
Pt returned from radiology at this time.  

## 2018-07-18 NOTE — ED Notes (Signed)
ED Provider at bedside. 

## 2018-07-18 NOTE — Discharge Instructions (Signed)
You were evaluated in the emergency department for cough along with body aches and fever.  This is likely the flu.  Your chest x-ray did not show an obvious pneumonia.  Please get plenty rest drink plenty of fluids and take Tylenol and ibuprofen for pain.  We are prescribing a medication to help with cough and a medication to help with flu.  Follow-up with your doctor return if any worsening symptoms.  You were also concerned about a breast lesion, I do not think this is any new with your current illness.  If it is continuing there after a few more days you should follow-up with the breast center.

## 2018-07-18 NOTE — ED Notes (Signed)
Patient transported to X-ray 

## 2018-07-18 NOTE — ED Provider Notes (Signed)
[facility] EMERGENCY DEPARTMENT Provider Note   CSN: [id number] Arrival date & time: 07/18/18  1132     History   Chief Complaint Chief Complaint  Patient presents with  . Cough    HPI Victoria Middleton is a 38 y.o. female.  She is complaining of 1 day of fevers cough myalgias.  Little bit of nasal congestion and sore throat.  No sick contacts.  She is also noticed a red spot on her left breast at around the 4 o'clock position that was started a couple of days ago.  She states she has had a family history of breast cancer.  She has not noticed any other lesions.  The history is provided by the patient.  Cough  This is a new problem. The current episode started yesterday. The problem occurs every few minutes. The problem has not changed since onset.The cough is non-productive. The maximum temperature recorded prior to her arrival was 100 to 100.9 F. The fever has been present for less than 1 day. Associated symptoms include chills, sweats, headaches, rhinorrhea, sore throat, myalgias and shortness of breath. Pertinent negatives include no chest pain, no ear congestion, no ear pain and no wheezing. She has tried nothing for the symptoms. The treatment provided no relief. She is not a smoker.    Past Medical History:  Diagnosis Date  . Asthma   . Depression   . Drug-seeking behavior   . Headache(784.0)   . Kidney stones   . Migraine   . Narcotic abuse (HCC)   . Seizures (HCC)   . Suicide attempt [facility])     Patient Active Problem List   Diagnosis Date Noted  . Overdose 11/24/2016  . Elevated serum creatinine 11/24/2016  . Seizure disorder (HCC) 08/24/2016  . Intractable chronic migraine without aura and with status migrainosus 01/24/2016  . Major depressive disorder, recurrent episode, moderate (HCC) 09/13/2012    Past Surgical History:  Procedure Laterality Date  . ANKLE SURGERY     left  . ANTERIOR CERVICAL DECOMP/DISCECTOMY FUSION  2018  . APPENDECTOMY     . CHOLECYSTECTOMY    . KNEE SURGERY    . TONSILLECTOMY       OB History    Gravida  0   Para      Term      Preterm      AB      Living        SAB      TAB      Ectopic      Multiple      Live Births               Home Medications    Prior to Admission medications   Medication Sig Start Date End Date Taking? Authorizing Provider  acetaminophen (TYLENOL) 500 MG tablet Take 1,000 mg by mouth every 4 (four) hours as needed for headache (pain).     [provider]  cyclobenzaprine (FLEXERIL) 10 MG tablet Take 1 tablet (10 mg total) by mouth 2 (two) times daily as needed for muscle spasms. 01/14/18   Hedges, Tinnie Gens, PA-C  HYDROcodone-acetaminophen (NORCO/VICODIN) 5-325 MG tablet Take 1 tablet by mouth every 4 (four) hours as needed. 01/23/18   Jacalyn Lefevre, MD  methocarbamol (ROBAXIN) 500 MG tablet Take 1 tablet (500 mg total) by mouth 2 (two) times daily. 02/16/18   Elisha Ponder, PA-C    Family History Family History  Problem Relation Age of Onset  .  Obesity Mother   . Diabetes Mother   . Hypertension Mother   . Depression Mother   . Suicidality Mother   . Diabetes Father   . Hypertension Father   . Heart attack Father     Social History Social History   Tobacco Use  . Smoking status: Never Smoker  . Smokeless tobacco: Never Used  Substance Use Topics  . Alcohol use: Yes    Comment: Rare  . Drug use: No     Allergies   Compazine [prochlorperazine edisylate]; Sulfa antibiotics; Aripiprazole; Benadryl [diphenhydramine hcl]; Ketorolac tromethamine; Naprosyn [naproxen]; Nsaids; Penicillins; and Toradol [ketorolac tromethamine]   Review of Systems Review of Systems  Constitutional: Positive for chills, fatigue and fever.  HENT: Positive for rhinorrhea and sore throat. Negative for ear pain.   Eyes: Negative for visual disturbance.  Respiratory: Positive for cough and shortness of breath. Negative for wheezing.   Cardiovascular:  Negative for chest pain.  Gastrointestinal: Negative for diarrhea and vomiting.  Genitourinary: Negative for dysuria.  Musculoskeletal: Positive for myalgias.  Skin: Positive for rash.  Neurological: Positive for headaches.     Physical Exam Updated Vital Signs BP 105/80 (BP Location: Right Arm)   Pulse 81   Temp 99.1 F (37.3 C) Comment: Took Tylenol at 10:00AM; 1000mg   Resp 17   Ht 5\' 8"  (1.727 m)   Wt 133.8 kg   SpO2 100%   BMI 44.85 kg/m   Physical Exam Vitals signs and nursing note reviewed. Exam conducted with a chaperone present.  Constitutional:      General: She is not in acute distress.    Appearance: She is well-developed.  HENT:     Head: Normocephalic and atraumatic.     Right Ear: Tympanic membrane normal.     Left Ear: Tympanic membrane normal.     Nose: Nose normal.     Mouth/Throat:     Mouth: Mucous membranes are moist.     Pharynx: Oropharynx is clear.  Eyes:     Conjunctiva/sclera: Conjunctivae normal.  Neck:     Musculoskeletal: Normal range of motion and neck supple.  Cardiovascular:     Rate and Rhythm: Normal rate and regular rhythm.     Heart sounds: No murmur.  Pulmonary:     Effort: Pulmonary effort is normal. No respiratory distress.     Breath sounds: Normal breath sounds. No wheezing or rhonchi.  Chest:    Abdominal:     Palpations: Abdomen is soft.     Tenderness: There is no abdominal tenderness.  Musculoskeletal: Normal range of motion.  Skin:    General: Skin is warm and dry.     Capillary Refill: Capillary refill takes less than 2 seconds.  Neurological:     General: No focal deficit present.     Mental Status: She is alert.     Gait: Gait normal.      ED Treatments / Results  Labs (all labs ordered are listed, but only abnormal results are displayed) Labs Reviewed - No data to display  EKG None  Radiology Dg Chest 2 View  Result Date: 07/18/2018 CLINICAL DATA:  One day history of cough and fever. Current  history of asthma. EXAM: CHEST - 2 VIEW COMPARISON:  09/04/2017 and earlier. FINDINGS: Cardiomediastinal silhouette unremarkable and unchanged. Minimal linear scarring in the lingula, unchanged. Lungs otherwise clear. No localized airspace consolidation. No pleural effusions. No pneumothorax. Normal pulmonary vascularity. Visualized bony thorax intact. IMPRESSION: No acute cardiopulmonary disease. Electronically Signed  By: Hulan Saas M.D.   On: 07/18/2018 13:09    Procedures Procedures (including critical care time)  Medications Ordered in ED Medications - No data to display   Initial Impression / Assessment and Plan / ED Course  I have reviewed the triage vital signs and the nursing notes.  Pertinent labs & imaging results that were available during my care of the patient were reviewed by me and considered in my medical decision making (see chart for details).  Clinical Course as of Jul 19 902  Thu Jul 18, 2018  6359 38 year old female with acute onset of cough and myalgias.  Did not get a flu shot this year.  She is uncomfortable appearing although not toxic.  Getting a chest x-ray.  Sats 100%.  Likely viral would cover with Tamiflu.   [MB]  1219 As far as the breast lesion goes I told her to get through this illness and if it still remains she would need to follow-up with her doctor for further work-up.   [MB]    Clinical Course User Index [MB] Terrilee Files, MD     Final Clinical Impressions(s) / ED Diagnoses   Final diagnoses:  Influenza-like illness  Cough  Lesion of skin of breast    ED Discharge Orders         Ordered    oseltamivir (TAMIFLU) 75 MG capsule  Every 12 hours     07/18/18 1320    benzonatate (TESSALON) 100 MG capsule  Every 8 hours     07/18/18 1320           Terrilee Files, MD 07/19/18 [phone]

## 2018-07-18 NOTE — ED Triage Notes (Signed)
Pt in c/o cough and congestion with a fever since last night, no distress noted

## 2018-08-09 ENCOUNTER — Emergency Department (HOSPITAL_COMMUNITY)
Admission: EM | Admit: 2018-08-09 | Discharge: 2018-08-09 | Disposition: A | Discharge status: Home / Self Care | Payer: Self-pay | Attending: Emergency Medicine | Admitting: Emergency Medicine

## 2018-08-09 ENCOUNTER — Other Ambulatory Visit: Payer: Self-pay

## 2018-08-09 ENCOUNTER — Encounter (HOSPITAL_COMMUNITY): Payer: Self-pay

## 2018-08-09 ENCOUNTER — Emergency Department (HOSPITAL_COMMUNITY): Payer: Self-pay

## 2018-08-09 DIAGNOSIS — H538 Other visual disturbances: Secondary | ICD-10-CM | POA: Insufficient documentation

## 2018-08-09 DIAGNOSIS — J45909 Unspecified asthma, uncomplicated: Secondary | ICD-10-CM | POA: Insufficient documentation

## 2018-08-09 DIAGNOSIS — R51 Headache: Secondary | ICD-10-CM | POA: Insufficient documentation

## 2018-08-09 DIAGNOSIS — Z79899 Other long term (current) drug therapy: Secondary | ICD-10-CM | POA: Insufficient documentation

## 2018-08-09 DIAGNOSIS — R519 Headache, unspecified: Secondary | ICD-10-CM

## 2018-08-09 DIAGNOSIS — R42 Dizziness and giddiness: Secondary | ICD-10-CM | POA: Insufficient documentation

## 2018-08-09 DIAGNOSIS — R4182 Altered mental status, unspecified: Secondary | ICD-10-CM | POA: Insufficient documentation

## 2018-08-09 LAB — COMPREHENSIVE METABOLIC PANEL
ALT: 8 U/L (ref 0–44)
AST: 14 U/L — ABNORMAL LOW (ref 15–41)
Albumin: 3.9 g/dL (ref 3.5–5.0)
Alkaline Phosphatase: 46 U/L (ref 38–126)
Anion gap: 10 (ref 5–15)
BUN: 6 mg/dL (ref 6–20)
CO2: 22 mmol/L (ref 22–32)
Calcium: 8.8 mg/dL — ABNORMAL LOW (ref 8.9–10.3)
Chloride: 106 mmol/L (ref 98–111)
Creatinine, Ser: 0.87 mg/dL (ref 0.44–1.00)
GFR calc non Af Amer: >60 mL/min (ref >60)
Glucose, Bld: 110 mg/dL — ABNORMAL HIGH (ref 70–99)
Potassium: 4 mmol/L (ref 3.5–5.1)
SODIUM: 138 mmol/L (ref 135–145)
Total Bilirubin: 0.5 mg/dL (ref 0.3–1.2)
Total Protein: 6.5 g/dL (ref 6.5–8.1)

## 2018-08-09 LAB — URINALYSIS, COMPLETE (UACMP) WITH MICROSCOPIC
Bilirubin Urine: NEGATIVE
Glucose, UA: NEGATIVE mg/dL
Hgb urine dipstick: NEGATIVE
Ketones, ur: NEGATIVE mg/dL
Nitrite: NEGATIVE
Protein, ur: NEGATIVE mg/dL
SPECIFIC GRAVITY, URINE: 1.016 (ref 1.005–1.030)
pH: 8 (ref 5.0–8.0)

## 2018-08-09 LAB — I-STAT BETA HCG BLOOD, ED (MC, WL, AP ONLY): I-stat hCG, quantitative: <5 m[IU]/mL (ref <5)

## 2018-08-09 LAB — CBC WITH DIFFERENTIAL/PLATELET
Abs Immature Granulocytes: 0.02 10*3/uL (ref 0.00–0.07)
Basophils Absolute: 0 10*3/uL (ref 0.0–0.1)
Basophils Relative: 0 %
Eosinophils Absolute: 0 10*3/uL (ref 0.0–0.5)
Eosinophils Relative: 0 %
HCT: 38.3 % (ref 36.0–46.0)
Hemoglobin: 11.8 g/dL — ABNORMAL LOW (ref 12.0–15.0)
Immature Granulocytes: 0 %
Lymphocytes Relative: 10 %
Lymphs Abs: 0.7 10*3/uL (ref 0.7–4.0)
MCH: 26.5 pg (ref 26.0–34.0)
MCHC: 30.8 g/dL (ref 30.0–36.0)
MCV: 86.1 fL (ref 80.0–100.0)
Monocytes Absolute: 0.4 10*3/uL (ref 0.1–1.0)
Monocytes Relative: 6 %
NRBC: 0 % (ref 0.0–0.2)
Neutro Abs: 5.8 10*3/uL (ref 1.7–7.7)
Neutrophils Relative %: 84 %
Platelets: 188 10*3/uL (ref 150–400)
RBC: 4.45 MIL/uL (ref 3.87–5.11)
RDW: 13.2 % (ref 11.5–15.5)
WBC: 6.9 10*3/uL (ref 4.0–10.5)

## 2018-08-09 LAB — RAPID URINE DRUG SCREEN, HOSP PERFORMED
Amphetamines: NOT DETECTED
BENZODIAZEPINES: NOT DETECTED
Barbiturates: NOT DETECTED
COCAINE: NOT DETECTED
Opiates: NOT DETECTED
Tetrahydrocannabinol: NOT DETECTED

## 2018-08-09 LAB — ETHANOL: Alcohol, Ethyl (B): <10 mg/dL (ref <10)

## 2018-08-09 LAB — ACETAMINOPHEN LEVEL: Acetaminophen (Tylenol), Serum: <10 ug/mL — ABNORMAL LOW (ref 10–30)

## 2018-08-09 LAB — SALICYLATE LEVEL: Salicylate Lvl: <7 mg/dL (ref 2.8–30.0)

## 2018-08-09 LAB — CBG MONITORING, ED: Glucose-Capillary: 100 mg/dL — ABNORMAL HIGH (ref 70–99)

## 2018-08-09 MED ORDER — SODIUM CHLORIDE 0.9 % IV BOLUS
1000.0000 mL | Freq: Once | INTRAVENOUS | Status: AC
  Administered 2018-08-09: 1000 mL via INTRAVENOUS

## 2018-08-09 MED ORDER — METOCLOPRAMIDE HCL 5 MG/ML IJ SOLN
10.0000 mg | Freq: Once | INTRAMUSCULAR | Status: AC
  Administered 2018-08-09: 10 mg via INTRAVENOUS
  Filled 2018-08-09: qty 2

## 2018-08-09 MED ORDER — SODIUM CHLORIDE 0.9 % IV SOLN
INTRAVENOUS | Status: DC
  Administered 2018-08-09: 11:00:00 via INTRAVENOUS

## 2018-08-09 MED ORDER — LEVETIRACETAM 1000 MG PO TABS: 1000.0000 mg | ORAL_TABLET | Freq: Two times a day (BID) | ORAL | 0 refills | Status: DC

## 2018-08-09 MED ORDER — SODIUM CHLORIDE 0.9% FLUSH: 10.0000 mL | INTRAVENOUS | Status: DC | PRN

## 2018-08-09 NOTE — ED Notes (Signed)
Patient ambulatory to bathroom with steady gait at this time without assistance.

## 2018-08-09 NOTE — ED Notes (Signed)
Patient verbalizes understanding of discharge instructions. Opportunity for questioning and answers were provided. Armband removed by staff, pt discharged from ED.  

## 2018-08-09 NOTE — ED Triage Notes (Signed)
Pt arrives via EMS after bieng found in a parking lot of a convenience store, sitting outside the car leaned up against the door in the rain. EMS states it does not appear the patient fell, no injuries noted. PT does not know hoe she got to that position, but pt also has seizure hx, does not know how she got to the position at which she was found; pt stopped taking her meds about a year and a half ago r/t financial reasons. Pt is A&Ox4 upon arrival

## 2018-08-09 NOTE — Discharge Instructions (Signed)
Please see the information and instructions below regarding your visit.  Your diagnoses today include:  1. Altered mental status, unspecified altered mental status type   2. Frontal headache     Tests performed today include: See side panel of your discharge paperwork for testing performed today. Vital signs are listed at the bottom of these instructions.   Fortunately, your workup today including labs are within normal limits.  Medications prescribed:    Take any prescribed medications only as prescribed, and any over the counter medications only as directed on the packaging.  Please resume your Keppra.  Please visit the website [email or web address] in order to find coupons.  You should be able to get a 1 month supply for under $20 of this medication.  Home care instructions:  Please follow any educational materials contained in this packet.   It is very important that you change your lifestyle in order to protect yourself and others from harm in the event that you have a seizure.  This includes:  No driving for 6 months after your most recent loss of consciousness event, and you must be cleared by a neurologist before resuming driving after this interval.  If you work in a job where you operate heavy machinery (e.g. Fork lifts), or any duty such that a sudden loss of consciousness could lead serious harm to yourself or others, you must cease that activity until cleared by a neurologist. Please ask your healthcare provider to provide any documentation necessary for your employer. Do not swim unsupervised or be around any body of water unsupervised (including bathtubs).  Do not climb ladders. Do not stay locked in enclosed spaces where no one could access you if you needed help. This includes bedrooms and bathrooms.  Assess all activity for safety before participating if a sudden loss of consciousness could lead to harm to self or others.  Follow-up instructions:  Please follow up with your  neurologist or establish care with the neurologist listed on your paperwork.   Return instructions:  Please return to the Emergency Department if you experience worsening symptoms.  Return to the emergency department if you have any further seizures, develop any weakness/numbness of any arm/leg, confusion, slurred speech, or sudden/severe headache. Please return if you have any other emergent concerns.  Additional Information:   Your vital signs today were: BP 123/71 (BP Location: Right Arm)    Pulse 72    Temp 98.4 F (36.9 C) (Oral)    Resp 12    Ht 5\' 8"  (1.727 m)    Wt 133.8 kg    LMP 07/31/2018    SpO2 100%    BMI 44.85 kg/m  If your blood pressure (BP) was elevated on multiple readings during this visit above 130 for the top number or above 80 for the bottom number, please have this repeated by your primary care provider within one month. --------------  Thank you for allowing [place] to participate in your care today.

## 2018-08-09 NOTE — ED Provider Notes (Signed)
[facility] EMERGENCY DEPARTMENT Provider Note   CSN: [id number] Arrival date & time: 08/09/18  0809     History   Chief Complaint Chief Complaint  Patient presents with  . Altered Mental Status    HPI Victoria Middleton is a 39 y.o. female.  HPI  Patient is a 38 year old female with a history of seizures, off of Keppra for approximately 1 year, major depressive disorder, headaches, depression presenting for altered mental status.  Patient reports that this morning she was driving and running errands, dropped her son off at her mother-in-law's, but then does not remember what happened next.  She was found leaning up against the door of a gas station.  She does not remember getting there.  Per EMS, there were no signs of trauma, or having knocked over objects.  There were no bystander reports of seizure-like activity.  Patient reports that she felt "off" yesterday.  She reports in the morning she was experiencing some "dizziness" with both a lightheaded and off-balance sensation.  Her husband reports that by the time he saw her in the evening, she was not experiencing the symptoms.  Patient reports these were gone this morning.  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  She says that they can vary from minutes to hours to even days.  She describes that these prodromal symptoms are variable, but usually involve altered sensorium and awareness.  Patient is also reporting a headache in the frontal-parietal region bilaterally.  She reports that she has some blurred vision bilaterally currently.  No diplopia.  Patient denies weakness or numbness in bilateral extremities, loss of continence of urine or tongue biting.  She does report that she has some right shoulder pain and has a history of dislocating her right shoulder, most recently in the summer of 2019.  Patient does have a history of suicidal gesture, but denies self harm attempts today.   Patient reports only occasional alcohol use, and denies tobacco or illicit drug use.  Past Medical History:  Diagnosis Date  . Asthma   . Depression   . Drug-seeking behavior   . Headache(784.0)   . Kidney stones   . Migraine   . Narcotic abuse (HCC)   . Seizures (HCC)   . Suicide attempt [facility])     Patient Active Problem List   Diagnosis Date Noted  . Overdose 11/24/2016  . Elevated serum creatinine 11/24/2016  . Seizure disorder (HCC) 08/24/2016  . Intractable chronic migraine without aura and with status migrainosus 01/24/2016  . Major depressive disorder, recurrent episode, moderate (HCC) 09/13/2012    Past Surgical History:  Procedure Laterality Date  . ANKLE SURGERY     left  . ANTERIOR CERVICAL DECOMP/DISCECTOMY FUSION  2018  . APPENDECTOMY    . CHOLECYSTECTOMY    . KNEE SURGERY    . TONSILLECTOMY       OB History    Gravida  0   Para      Term      Preterm      AB      Living        SAB      TAB      Ectopic      Multiple      Live Births               Home Medications    Prior to Admission medications   Medication Sig Start Date End  Date Taking? Authorizing Provider  acetaminophen (TYLENOL) 500 MG tablet Take 1,000 mg by mouth every 4 (four) hours as needed for headache (pain).     [provider]  benzonatate (TESSALON) 100 MG capsule Take 1 capsule (100 mg total) by mouth every 8 (eight) hours. 07/18/18   Terrilee Files, MD  cyclobenzaprine (FLEXERIL) 10 MG tablet Take 1 tablet (10 mg total) by mouth 2 (two) times daily as needed for muscle spasms. 01/14/18   Hedges, Tinnie Gens, PA-C  HYDROcodone-acetaminophen (NORCO/VICODIN) 5-325 MG tablet Take 1 tablet by mouth every 4 (four) hours as needed. 01/23/18   Jacalyn Lefevre, MD  methocarbamol (ROBAXIN) 500 MG tablet Take 1 tablet (500 mg total) by mouth 2 (two) times daily. 02/16/18   Aviva Kluver B, PA-C  oseltamivir (TAMIFLU) 75 MG capsule Take 1 capsule (75 mg total) by mouth  every 12 (twelve) hours. 07/18/18   Terrilee Files, MD    Family History Family History  Problem Relation Age of Onset  . Obesity Mother   . Diabetes Mother   . Hypertension Mother   . Depression Mother   . Suicidality Mother   . Diabetes Father   . Hypertension Father   . Heart attack Father     Social History Social History   Tobacco Use  . Smoking status: Never Smoker  . Smokeless tobacco: Never Used  Substance Use Topics  . Alcohol use: Yes    Comment: Rare  . Drug use: No     Allergies   Compazine [prochlorperazine edisylate]; Sulfa antibiotics; Aripiprazole; Benadryl [diphenhydramine hcl]; Ketorolac tromethamine; Naprosyn [naproxen]; Nsaids; Penicillins; and Toradol [ketorolac tromethamine]   Review of Systems Review of Systems  Constitutional: Negative for chills and fever.  HENT: Negative for congestion and sore throat.   Eyes: Negative for visual disturbance.  Respiratory: Negative for cough, chest tightness and shortness of breath.   Cardiovascular: Negative for chest pain.  Gastrointestinal: Negative for abdominal pain, nausea and vomiting.  Genitourinary: Negative for dysuria and flank pain.  Musculoskeletal: Negative for back pain and myalgias.  Skin: Negative for rash.  Neurological: Positive for headaches. Negative for dizziness and light-headedness.  Psychiatric/Behavioral: Positive for confusion. Negative for hallucinations.       +Altered awareness.     Physical Exam Updated Vital Signs Ht 5\' 8"  (1.727 m)   Wt 133.8 kg   LMP 07/10/2018   BMI 44.85 kg/m   Physical Exam Vitals signs and nursing note reviewed.  Constitutional:      General: She is not in acute distress.    Appearance: She is well-developed.  HENT:     Head: Normocephalic and atraumatic.     Mouth/Throat:     Mouth: Mucous membranes are moist.  Eyes:     Extraocular Movements: Extraocular movements intact.     Conjunctiva/sclera: Conjunctivae normal.     Pupils:  Pupils are equal, round, and reactive to light.  Neck:     Musculoskeletal: Normal range of motion and neck supple.  Cardiovascular:     Rate and Rhythm: Normal rate and regular rhythm.     Heart sounds: S1 normal and S2 normal. No murmur.  Pulmonary:     Effort: Pulmonary effort is normal.     Breath sounds: Normal breath sounds. No wheezing or rales.  Abdominal:     General: There is no distension.     Palpations: Abdomen is soft.     Tenderness: There is no abdominal tenderness. There is no guarding.  Musculoskeletal:  General: No deformity.     Comments: Patient has tenderness to palpation over the glenohumeral joint of the right shoulder.  No palpable step-off or crepitus.  Lymphadenopathy:     Cervical: No cervical adenopathy.  Skin:    General: Skin is warm and dry.     Findings: No erythema or rash.  Neurological:     Mental Status: She is alert.     Comments: Mental Status:  Alert, oriented, thought content appropriate, able to give a coherent history. Speech fluent without evidence of aphasia. Able to follow 2 step commands without difficulty.  Cranial Nerves:  II:  Peripheral visual fields grossly normal, pupils equal, round, reactive to light III,IV, VI: ptosis not present, extra-ocular motions intact bilaterally  V,VII: smile symmetric, facial light touch sensation equal VIII: hearing grossly normal to voice  X: uvula elevates symmetrically  XI: bilateral shoulder shrug symmetric and strong XII: midline tongue extension without fassiculations Motor:  Normal tone. 5/5 in upper and lower extremities bilaterally including strong and equal grip strength. Full strength exam limited at this time due to right shoulder pain.  Sensory: Pinprick and light touch normal in all extremities.  Cerebellar: normal finger-to-nose with bilateral upper extremities Gait: normal gait and balance Stance: No pronator drift and good coordination, strength, and position sense with  tapping of bilateral arms (performed in sitting position). CV: distal pulses palpable throughout    Psychiatric:        Behavior: Behavior normal.        Thought Content: Thought content normal.        Judgment: Judgment normal.      ED Treatments / Results  Labs (all labs ordered are listed, but only abnormal results are displayed) Labs Reviewed  COMPREHENSIVE METABOLIC PANEL - Abnormal; Notable for the following components:      Result Value   Glucose, Bld 110 (*)    Calcium 8.8 (*)    AST 14 (*)    All other components within normal limits  CBC WITH DIFFERENTIAL/PLATELET - Abnormal; Notable for the following components:   Hemoglobin 11.8 (*)    All other components within normal limits  URINALYSIS, COMPLETE (UACMP) WITH MICROSCOPIC - Abnormal; Notable for the following components:   Leukocytes, UA SMALL (*)    Bacteria, UA RARE (*)    All other components within normal limits  ACETAMINOPHEN LEVEL - Abnormal; Notable for the following components:   Acetaminophen (Tylenol), Serum <10 (*)    All other components within normal limits  CBG MONITORING, ED - Abnormal; Notable for the following components:   Glucose-Capillary 100 (*)    All other components within normal limits  RAPID URINE DRUG SCREEN, HOSP PERFORMED  ETHANOL  SALICYLATE LEVEL  I-STAT BETA HCG BLOOD, ED ([facility], [facility], [facility] ONLY)    EKG EKG Interpretation  Date/Time:  Friday August 09 2018 08:16:21 EST Ventricular Rate:  67 PR Interval:    QRS Duration: 118 QT Interval:  389 QTC Calculation: 411 R Axis:   -10 Text Interpretation:  Sinus rhythm Nonspecific intraventricular conduction delay Nonspecific T wave abnormality Confirmed by Cathren Laine ([id number]) on 08/09/2018 11:23:56 AM   Radiology Dg Chest 2 View  Result Date: 08/09/2018 CLINICAL DATA:  Seizure and loss of consciousness EXAM: CHEST - 2 VIEW COMPARISON:  07/18/2018 FINDINGS: Generous heart size on frontal view related to low volumes based on  lateral view. There is no edema, consolidation, effusion, or pneumothorax. Minimal atelectasis on the left. IMPRESSION: No active cardiopulmonary disease. Electronically  Signed   By: Marnee Spring M.D.   On: 08/09/2018 09:33   Dg Shoulder Right  Result Date: 08/09/2018 CLINICAL DATA:  Severe right shoulder pain EXAM: RIGHT SHOULDER - 2+ VIEW COMPARISON:  02/16/2018 FINDINGS: There is no evidence of fracture or dislocation. There is no evidence of arthropathy or other focal bone abnormality. Soft tissues are unremarkable. IMPRESSION: Negative. Electronically Signed   By: Marnee Spring M.D.   On: 08/09/2018 09:33   Ct Head Wo Contrast  Result Date: 08/09/2018 CLINICAL DATA:  Headache and dizziness with blurred vision. History of seizures EXAM: CT HEAD WITHOUT CONTRAST TECHNIQUE: Contiguous axial images were obtained from the base of the skull through the vertex without intravenous contrast. COMPARISON:  March 15, 2018 FINDINGS: Brain: The ventricles are normal in size and configuration. There is no intracranial mass, hemorrhage, extra-axial fluid collection, or midline shift. The brain parenchyma appears unremarkable. No evident acute infarct. Vascular: No hyperdense vessel. There is no appreciable vascular calcification. Skull: The bony calvarium appears intact. Sinuses/Orbits: There is mucosal thickening in several ethmoid air cells. Frontal sinuses are aplastic. Other visualized paranasal sinuses are clear. Visualized orbits appear symmetric bilaterally. Other: Visualized mastoid air cells are clear. IMPRESSION: Mucosal thickening in several ethmoid air cells. Study otherwise unremarkable. Electronically Signed   By: Bretta Bang III M.D.   On: 08/09/2018 09:11    Procedures Procedures (including critical care time)  Medications Ordered in ED Medications  sodium chloride 0.9 % bolus 1,000 mL (0 mLs Intravenous Stopped 08/09/18 1257)    And  0.9 %  sodium chloride infusion ( Intravenous  New Bag/Given 08/09/18 1107)  sodium chloride flush (NS) 0.9 % injection 10-40 mL (has no administration in time range)  metoCLOPramide (REGLAN) injection 10 mg (10 mg Intravenous Given 08/09/18 1105)     Initial Impression / Assessment and Plan / ED Course  I have reviewed the triage vital signs and the nursing notes.  Pertinent labs & imaging results that were available during my care of the patient were reviewed by me and considered in my medical decision making (see chart for details).  Clinical Course as of Aug 09 1304  Fri Aug 09, 2018  1225 Not suggestive of infection.   Urinalysis, Complete w Microscopic(!) [AM]    Clinical Course User Index [AM] Elisha Ponder, PA-C    Patient is nontoxic-appearing, afebrile, hemodynamically stable and in no acute distress.  No hypoglycemia on arrival.  Differential diagnosis includes: ICH / Stroke, seizure, ACS, Sepsis syndrome, Infection - UTI/Pneumonia, Encephalopathy, Electrolyte abnormality, Drug overdose, DKA, Metabolic disorders including thyroid disorders, adrenal insufficiency, Hypercapnia / COPD, Hypoxia  Given patient's history of seizures, possible right shoulder dislocation, apparent post ictal state, seizures most likely.  Will assess for overdose given patient's history of suicidal gesture although she denies today.  Will assess for ICH given apparent loss of consciousness and headache.  Patient is presenting within 6 hours of onset of symptoms, therefore noncontrast head CT should be sensitive for assessment for subarachnoid hemorrhage.  Workup demonstrating normal electrolytes, no signs of ingestions.  Patient is not pregnant.  Urinalysis not suggestive of infection.  UDS is negative.  Patient made hemodynamically stable and in no acute distress during emergency department course. EKG in NS rhythm without ischemia, infarction, or arrhythmia.  I did discuss with the patient that her loss of consciousness and awareness episodes are  of unclear etiology at this time, and she should avoid driving or participating in any activity during which  a sudden loss of consciousness could lead to harm to self or others.  Placed ambulatory referral to neurology.  Case management consult placed to assist with setting [facility].  Patient was given resources.  Represcribed short course of patient's Keppra.  Return precautions given for any further episodes of loss of consciousness, alterations in awareness, fevers with alterations in awareness, weakness or numbness, or new or worsening symptoms.  Patient and family are in understanding and agree with the plan of care.  This is a shared visit with Dr. Cathren LaineKevin Steinl. Patient was independently evaluated by this attending physician. Attending physician consulted in evaluation and discharge management.  Final Clinical Impressions(s) / ED Diagnoses   Final diagnoses:  Altered mental status, unspecified altered mental status type  Frontal headache    ED Discharge Orders         Ordered    levETIRAcetam (KEPPRA) 1000 MG tablet  2 times daily     08/09/18 1300    Ambulatory referral to Neurology    Comments:  An appointment is requested in approximately: 4 weeks   08/09/18 1306           Delia ChimesMurray, Traeton Bordas B, PA-C 08/09/18 1309    Cathren LaineSteinl, Kevin, MD 08/10/18 1520

## 2018-08-14 ENCOUNTER — Emergency Department
Admission: EM | Admit: 2018-08-14 | Discharge: 2018-08-15 | Disposition: A | Discharge status: Home / Self Care | Payer: Self-pay | Attending: Emergency Medicine | Admitting: Emergency Medicine

## 2018-08-14 ENCOUNTER — Other Ambulatory Visit: Payer: Self-pay

## 2018-08-14 ENCOUNTER — Encounter: Payer: Self-pay | Admitting: Emergency Medicine

## 2018-08-14 ENCOUNTER — Emergency Department: Payer: Self-pay

## 2018-08-14 DIAGNOSIS — Y939 Activity, unspecified: Secondary | ICD-10-CM | POA: Insufficient documentation

## 2018-08-14 DIAGNOSIS — J45909 Unspecified asthma, uncomplicated: Secondary | ICD-10-CM | POA: Insufficient documentation

## 2018-08-14 DIAGNOSIS — S86002A Unspecified injury of left Achilles tendon, initial encounter: Secondary | ICD-10-CM | POA: Insufficient documentation

## 2018-08-14 DIAGNOSIS — X509XXA Other and unspecified overexertion or strenuous movements or postures, initial encounter: Secondary | ICD-10-CM | POA: Insufficient documentation

## 2018-08-14 DIAGNOSIS — Y92009 Unspecified place in unspecified non-institutional (private) residence as the place of occurrence of the external cause: Secondary | ICD-10-CM | POA: Insufficient documentation

## 2018-08-14 DIAGNOSIS — Y999 Unspecified external cause status: Secondary | ICD-10-CM | POA: Insufficient documentation

## 2018-08-14 MED ORDER — HYDROCODONE-ACETAMINOPHEN 5-325 MG PO TABS
1.0000 | ORAL_TABLET | Freq: Once | ORAL | Status: AC
  Administered 2018-08-14: 1 via ORAL
  Filled 2018-08-14: qty 1

## 2018-08-14 NOTE — ED Provider Notes (Signed)
[facility] Emergency Department Provider Note  ____________________________________________   First MD Initiated Contact with Patient 08/14/18 2257     (approximate)  I have reviewed the triage vital signs and the nursing notes.   HISTORY  Chief Complaint Ankle Pain    HPI Victoria Middleton is a 38 y.o. female presents emergency department with complaints of left ankle pain.  She states that she stepped on a small plastic hanger and slipped.  She states that the ankle twisted over and she felt a large pop in the posterior of her ankle.  She denies any numbness or tingling.  She denies any other injuries.    Past Medical History:  Diagnosis Date  . Asthma   . Depression   . Drug-seeking behavior   . Headache(784.0)   . Kidney stones   . Migraine   . Narcotic abuse (HCC)   . Seizures (HCC)   . Suicide attempt [facility](HCC)     Patient Active Problem List   Diagnosis Date Noted  . Overdose 11/24/2016  . Elevated serum creatinine 11/24/2016  . Seizure disorder (HCC) 08/24/2016  . Intractable chronic migraine without aura and with status migrainosus 01/24/2016  . Major depressive disorder, recurrent episode, moderate (HCC) 09/13/2012    Past Surgical History:  Procedure Laterality Date  . ANKLE SURGERY     left  . ANTERIOR CERVICAL DECOMP/DISCECTOMY FUSION  2018  . APPENDECTOMY    . CHOLECYSTECTOMY    . KNEE SURGERY    . TONSILLECTOMY      Prior to Admission medications   Medication Sig Start Date End Date Taking? Authorizing Provider  acetaminophen (TYLENOL) 500 MG tablet Take 1,000 mg by mouth every 4 (four) hours as needed for mild pain.     [provider]  levETIRAcetam (KEPPRA) 1000 MG tablet Take 1 tablet (1,000 mg total) by mouth 2 (two) times daily for 14 days. 08/09/18 08/23/18  [name], Alyssa B, PA-C    Allergies Compazine [prochlorperazine edisylate]; Sulfa antibiotics; Aripiprazole; Benadryl [diphenhydramine hcl]; Ketorolac  tromethamine; Naprosyn [naproxen]; Nsaids; Penicillins; and Toradol [ketorolac tromethamine]  Family History  Problem Relation Age of Onset  . Obesity Mother   . Diabetes Mother   . Hypertension Mother   . Depression Mother   . Suicidality Mother   . Diabetes Father   . Hypertension Father   . Heart attack Father     Social History Social History   Tobacco Use  . Smoking status: Never Smoker  . Smokeless tobacco: Never Used  Substance Use Topics  . Alcohol use: Yes    Comment: Rare  . Drug use: No    Review of Systems  Constitutional: No fever/chills Eyes: No visual changes. ENT: No sore throat. Respiratory: Denies cough Genitourinary: Negative for dysuria. Musculoskeletal: Negative for back pain.  Positive for left ankle pain Skin: Negative for rash.    ____________________________________________   PHYSICAL EXAM:  VITAL SIGNS: ED Triage Vitals  Enc Vitals Group     BP 08/14/18 2119 124/68     Pulse Rate 08/14/18 2119 72     Resp 08/14/18 2119 20     Temp 08/14/18 2119 98.6 F (37 C)     Temp Source 08/14/18 2119 Oral     SpO2 08/14/18 2119 99 %     Weight 08/14/18 2118 290 lb (131.5 kg)     Height 08/14/18 2118 5\' 8"  (1.727 m)     Head Circumference --      Peak Flow --  Pain Score 08/14/18 2118 6     Pain Loc --      Pain Edu? --      Excl. in GC? --     Constitutional: Alert and oriented. Well appearing and in no acute distress. Eyes: Conjunctivae are normal.  Head: Atraumatic. Nose: No congestion/rhinnorhea. Mouth/Throat: Mucous membranes are moist.   Neck:  supple no lymphadenopathy noted Cardiovascular: Normal rate, regular rhythm.  Respiratory: Normal respiratory effort.  No retractions GU: deferred Musculoskeletal: Left ankle is tender at the posterior lung of Achilles tendon, patient still has movement of the foot and is able to flex and extend but reproduces pain.  Thompson test does show movement that reproduces pain,   neurovascular is intact  neurologic:  Normal speech and language.  Skin:  Skin is warm, dry and intact. No rash noted. Psychiatric: Mood and affect are normal. Speech and behavior are normal.  ____________________________________________   LABS (all labs ordered are listed, but only abnormal results are displayed)  Labs Reviewed - No data to display ____________________________________________   ____________________________________________  RADIOLOGY  X-ray of the left ankle is negative  ____________________________________________   PROCEDURES  Procedure(s) performed: Posterior OCL was applied by the tech, patient has crutches at home.  Procedures    ____________________________________________   INITIAL IMPRESSION / ASSESSMENT AND PLAN / ED COURSE  Pertinent labs & imaging results that were available during my care of the patient were reviewed by me and considered in my medical decision making (see chart for details).   Patient is 38 year old female presents emergency department after slipping at [facility] and feeling a large pop in the back of her ankle.  Physical exam shows tenderness along the Achilles, she does still have movement but it reproduces pain.  X-ray of the left ankle is negative  Explained the findings to the patient.  She was placed in a posterior OCL for protection of the Achilles.  She is to use crutches.  Follow-up with orthopedics.  She is requesting to see Delbert Harness in [place].  Explained to her we do not refer her but that she can surely call for an appointment.  She is to elevate and ice.  Take Tylenol for pain as needed.  Return if worsening.  She was given a work note as requested.     As part of my medical decision making, I reviewed the following data within the electronic MEDICAL RECORD NUMBER History obtained from family, Nursing notes reviewed and incorporated, Old chart reviewed, Radiograph reviewed x-ray left ankle is negative, Notes  from prior ED visits and [place] Controlled Substance Database  ____________________________________________   FINAL CLINICAL IMPRESSION(S) / ED DIAGNOSES  Final diagnoses:  Injury of left Achilles tendon, initial encounter      NEW MEDICATIONS STARTED DURING THIS VISIT:  Current Discharge Medication List       Note:  This document was prepared using Dragon voice recognition software and may include unintentional dictation errors.    Faythe Ghee, PA-C 08/15/18 0017    Pershing Proud Myra Rude, MD 08/15/18 [phone]

## 2018-08-14 NOTE — ED Triage Notes (Signed)
Pt to triage via w/c with no distress noted; reports slipped at [facility] rolling left ankle PTA

## 2018-08-14 NOTE — Discharge Instructions (Addendum)
Follow-up with your regular doctor for evaluation of the Achilles tendon.  Wear the splint until evaluated by your orthopedist.  Elevate and ice the ankle.  Take Tylenol for pain as needed.  No work until evaluated by orthopedist.

## 2018-10-01 ENCOUNTER — Ambulatory Visit: Payer: Self-pay | Admitting: Neurology

## 2018-10-28 ENCOUNTER — Telehealth: Payer: Self-pay

## 2018-10-28 NOTE — Telephone Encounter (Signed)
I contacted the pt in regards to her 10/30/18 8:30 appt. Pt was advised due to current COVID 19 pandemic, our office is severely reducing in office visits for at least the next 2 weeks, in order to minimize the risk to our patients and healthcare providers.   Pt was offered a video visit but declined. Patient states she is a self pay pt and does not have the funds to complete a visit at this time.   Patient advised she would call our office back to schedule once she has the funds to do so.

## 2018-10-30 ENCOUNTER — Ambulatory Visit: Payer: Self-pay | Admitting: Neurology

## 2018-12-17 ENCOUNTER — Other Ambulatory Visit: Payer: Self-pay

## 2018-12-17 ENCOUNTER — Encounter (HOSPITAL_COMMUNITY): Payer: Self-pay

## 2018-12-17 ENCOUNTER — Emergency Department (HOSPITAL_COMMUNITY): Payer: Self-pay

## 2018-12-17 ENCOUNTER — Emergency Department (HOSPITAL_COMMUNITY)
Admission: EM | Admit: 2018-12-17 | Discharge: 2018-12-17 | Disposition: A | Discharge status: Home / Self Care | Payer: Self-pay | Attending: Emergency Medicine | Admitting: Emergency Medicine

## 2018-12-17 DIAGNOSIS — Y92310 Basketball court as the place of occurrence of the external cause: Secondary | ICD-10-CM | POA: Insufficient documentation

## 2018-12-17 DIAGNOSIS — X509XXA Other and unspecified overexertion or strenuous movements or postures, initial encounter: Secondary | ICD-10-CM | POA: Insufficient documentation

## 2018-12-17 DIAGNOSIS — Y999 Unspecified external cause status: Secondary | ICD-10-CM | POA: Insufficient documentation

## 2018-12-17 DIAGNOSIS — Y9367 Activity, basketball: Secondary | ICD-10-CM | POA: Insufficient documentation

## 2018-12-17 DIAGNOSIS — J45909 Unspecified asthma, uncomplicated: Secondary | ICD-10-CM | POA: Insufficient documentation

## 2018-12-17 DIAGNOSIS — S86002A Unspecified injury of left Achilles tendon, initial encounter: Secondary | ICD-10-CM | POA: Insufficient documentation

## 2018-12-17 NOTE — ED Triage Notes (Signed)
Per EMS, Pt is coming from home, while playing basketball she heard a pop in her left ankle. Tender to touch, and swelling noted. Previous torn ligament in that area. No rotation, or obvious deformity.

## 2018-12-17 NOTE — ED Notes (Signed)
Bed: WA07 Expected date:  Expected time:  Means of arrival:  Comments: EMS 38 yo female from home/ankle pain-felt a pop playing basketball-Fentanyl 50 mcg

## 2018-12-17 NOTE — ED Provider Notes (Signed)
[facility]-EMERGENCY DEPT Provider Note   CSN: [id number] Arrival date & time: 12/17/18  2047    History   Chief Complaint Chief Complaint  Patient presents with  . Ankle Pain    HPI Victoria Middleton is a 38 y.o. female.     Patient with history of previous left foot surgery presents the emergency department today with complaint of acute onset of left posterior ankle pain.  She patient states she was playing basketball with her family and jumped up and when she came down developed acute pain in the back of the ankle causing her to fall to the ground.  She did not hit her head or lose consciousness.  EMS was called and patient was placed in a splint prior to arrival.  She denies other injuries.  EMS administered pain medication.  Patient felt a pop when she landed.  She had a ankle injury earlier this year and had an MRI done which showed tendinitis.  She does have a podiatrist.  The onset of this condition was acute. The course is constant. Aggravating factors: movement. Alleviating factors: none.       Past Medical History:  Diagnosis Date  . Asthma   . Depression   . Drug-seeking behavior   . Headache(784.0)   . Kidney stones   . Migraine   . Narcotic abuse (HCC)   . Seizures (HCC)   . Suicide attempt [facility](HCC)     Patient Active Problem List   Diagnosis Date Noted  . Overdose 11/24/2016  . Elevated serum creatinine 11/24/2016  . Seizure disorder (HCC) 08/24/2016  . Intractable chronic migraine without aura and with status migrainosus 01/24/2016  . Major depressive disorder, recurrent episode, moderate (HCC) 09/13/2012    Past Surgical History:  Procedure Laterality Date  . ANKLE SURGERY     left  . ANTERIOR CERVICAL DECOMP/DISCECTOMY FUSION  2018  . APPENDECTOMY    . CHOLECYSTECTOMY    . KNEE SURGERY    . TONSILLECTOMY       OB History    Gravida  0   Para      Term      Preterm      AB      Living        SAB      TAB      Ectopic      Multiple      Live Births               Home Medications    Prior to Admission medications   Medication Sig Start Date End Date Taking? Authorizing Provider  acetaminophen (TYLENOL) 500 MG tablet Take 1,000 mg by mouth every 4 (four) hours as needed for mild pain.    Yes [provider]  levETIRAcetam (KEPPRA) 1000 MG tablet Take 1 tablet (1,000 mg total) by mouth 2 (two) times daily for 14 days. 08/09/18 08/23/18  Elisha PonderMurray, Alyssa B, PA-C    Family History Family History  Problem Relation Age of Onset  . Obesity Mother   . Diabetes Mother   . Hypertension Mother   . Depression Mother   . Suicidality Mother   . Diabetes Father   . Hypertension Father   . Heart attack Father     Social History Social History   Tobacco Use  . Smoking status: Never Smoker  . Smokeless tobacco: Never Used  Substance Use Topics  . Alcohol use: Yes    Comment: Rare  .  Drug use: No     Allergies   Compazine [prochlorperazine edisylate]; Sulfa antibiotics; Aripiprazole; Benadryl [diphenhydramine hcl]; Ketorolac tromethamine; Naprosyn [naproxen]; Nsaids; Penicillins; and Toradol [ketorolac tromethamine]   Review of Systems Review of Systems  Constitutional: Negative for activity change.  Musculoskeletal: Positive for gait problem and myalgias. Negative for arthralgias, back pain, joint swelling and neck pain.  Skin: Negative for wound.  Neurological: Negative for weakness and numbness.     Physical Exam Updated Vital Signs BP 139/82   Pulse 77   Temp 98.5 F (36.9 C)   Resp 20   Ht  (1.727 m)   Wt 136.1 kg   LMP 12/10/2018   SpO2 100%   BMI 45.61 kg/m   Physical Exam Vitals signs and nursing note reviewed.  Constitutional:      Appearance: She is well-developed.  HENT:     Head: Normocephalic and atraumatic.  Eyes:     Pupils: Pupils are equal, round, and reactive to light.  Neck:     Musculoskeletal: Normal range of motion and neck  supple.  Cardiovascular:     Pulses: Normal pulses. No decreased pulses.          Dorsalis pedis pulses are 2+ on the left side.  Musculoskeletal:        General: Tenderness present.     Left knee: Normal.     Left ankle: She exhibits decreased range of motion (Patient unable to dorsiflex at the ankle). Tenderness. No lateral malleolus and no medial malleolus tenderness found. Achilles tendon exhibits abnormal Thompson's test results.     Left lower leg: She exhibits tenderness. She exhibits no bony tenderness and no swelling.     Left foot: Normal.  Skin:    General: Skin is warm and dry.  Neurological:     Mental Status: She is alert.     Sensory: No sensory deficit.     Comments: Motor, sensation, and vascular distal to the injury is fully intact.       ED Treatments / Results  Labs (all labs ordered are listed, but only abnormal results are displayed) Labs Reviewed - No data to display  EKG None  Radiology Dg Ankle Complete Left  Result Date: 12/17/2018 CLINICAL DATA:  Basketball injury. Pain. EXAM: LEFT ANKLE COMPLETE - 3+ VIEW COMPARISON:  10/22/2017. FINDINGS: No evidence of fracture or dislocation. Osseous spurring from the posterior malleolus is stable. Cannulated screw transfixes an old calcaneal fracture. Soft tissue swelling. IMPRESSION: No fracture or dislocation. Soft tissue swelling. Electronically Signed   By: Elsie Stain M.D.   On: 12/17/2018 21:43    Procedures Procedures (including critical care time)  Medications Ordered in ED Medications - No data to display   Initial Impression / Assessment and Plan / ED Course  I have reviewed the triage vital signs and the nursing notes.  Pertinent labs & imaging results that were available during my care of the patient were reviewed by me and considered in my medical decision making (see chart for details).        Patient seen and examined.  Exam is concerning for Achilles tendon injury.  Patient will need  to be placed in a splint and provided with crutches.  She will need podiatry/orthopedic follow-up.  Pain currently controlled.  X-ray read pending, reviewed by myself, no obvious fractures.   Vital signs reviewed and are as follows: BP 139/82   Pulse 77   Temp 98.5 F (36.9 C)   Resp 20  Ht 5\' 8"  (1.727 m)   Wt 136.1 kg   LMP 12/10/2018   SpO2 100%   BMI 45.61 kg/m   10:33 PM patient updated on results.  She has been placed in a posterior short leg splint with plantar flexion and given crutches.  Patient will be discharged home with rice protocol, orthopedic follow-up.  Final Clinical Impressions(s) / ED Diagnoses   Final diagnoses:  Achilles tendon injury, left, initial encounter   Patient with left ankle pain.  Mechanism is concerning for an Achilles injury.  X-rays negative.  Lower extremity is neurovascularly intact distally.  Patient has decreased range of motion with dorsiflexion and a positive Thompson test.  She will need orthopedic follow-up.  Immobilization as above.  ED Discharge Orders    None       Renne Crigler, Cordelia Poche 12/17/18 2234    Gerhard Munch, MD 12/18/18 [phone]

## 2018-12-17 NOTE — Discharge Instructions (Signed)
Please read and follow all provided instructions.  Your diagnoses today include:  1. Achilles tendon injury, left, initial encounter     Tests performed today include:  An x-ray of the affected area - does NOT show any broken bones  Vital signs. See below for your results today.   Medications prescribed:   None  Take any prescribed medications only as directed.  Home care instructions:   Follow any educational materials contained in this packet  Use crutches until told otherwise by your orthopedic doctor.  Follow R.I.C.E. Protocol:  R - rest your injury   I  - use ice on injury without applying directly to skin  C - compress injury with bandage or splint  E - elevate the injury as much as possible  Follow-up instructions: Please follow-up with the provided orthopedic physician (bone specialist) in 1 week.  You may have an Achilles tendon injury which will require specialty follow-up.  Return instructions:   Please return if your toes or feet are numb or tingling, appear gray or blue, or you have severe pain (also elevate the leg and loosen splint or wrap if you were given one)  Please return to the Emergency Department if you experience worsening symptoms.   Please return if you have any other emergent concerns.  Additional Information:  Your vital signs today were: BP 139/82    Pulse 77    Temp 98.5 F (36.9 C)    Resp 20    Ht 5\' 8"  (1.727 m)    Wt 136.1 kg    LMP 12/10/2018    SpO2 100%    BMI 45.61 kg/m  If your blood pressure (BP) was elevated above 135/85 this visit, please have this repeated by your doctor within one month. --------------

## 2018-12-26 ENCOUNTER — Encounter: Payer: Self-pay | Admitting: Family Medicine

## 2018-12-26 ENCOUNTER — Other Ambulatory Visit: Payer: Self-pay

## 2018-12-26 ENCOUNTER — Ambulatory Visit (INDEPENDENT_AMBULATORY_CARE_PROVIDER_SITE_OTHER): Payer: Self-pay | Admitting: Family Medicine

## 2018-12-26 DIAGNOSIS — G8929 Other chronic pain: Secondary | ICD-10-CM

## 2018-12-26 DIAGNOSIS — M79672 Pain in left foot: Secondary | ICD-10-CM

## 2018-12-26 MED ORDER — TRAMADOL HCL 50 MG PO TABS: 50.0000 mg | ORAL_TABLET | Freq: Every evening | ORAL | 0 refills | Status: DC | PRN

## 2018-12-26 NOTE — Progress Notes (Signed)
Office Visit Note   Patient: Victoria Middleton           Date of Birth: 1981/05/07           MRN: [id number] Visit Date: 12/26/2018 Requested by: No referring provider defined for this encounter. PCP: Patient, No Pcp Per  Subjective: Chief Complaint  Patient presents with  . Left Ankle - Pain    Original injury was 08/14/2018, when slipped on a hanger. Went to podiatrist - diagnosed with tendonitis (had MRI). Then, was shooting basketball 6/02, felt a snap and pain when pushed off with left foot. In posterior splint/using crutches.    HPI: She is a 38 year old with left heel pain.  Original injury January 29, she slipped on a hanger in [place].  She felt immediate pain in the Achilles area and was evaluated by podiatrist.  Eventually she had an MRI scan ordered by podiatrist which I reviewed today on computer.  MRI showed insertional Achilles tendinopathy with retro-calcaneal bursitis, no obvious full-thickness tear.  She was placed in a fracture boot for few weeks and gradually started increasing her activities but pain was not getting better.  She has been out of work since that injury.  On June 2, she was with her kids shooting baskets at the basketball hoop, did not jump but felt something pop with sudden severe nauseating pain on the posterior heel.  Since then she has not been able to bear weight.  Using Tylenol with no relief.                ROS: Denies fevers or chills.  All other systems were reviewed and are negative.  Objective: Vital Signs: LMP 12/10/2018   Physical Exam:  General:  Alert and oriented, in no acute distress. Pulm:  Breathing unlabored. Psy:  Normal mood, congruent affect. Skin: No skin breakdown, no rash or bruising. Left heel: There is no palpable defect in the Achilles, she is very tender to palpation at the insertion on the calcaneus.  There is some tenderness in the retrocalcaneal bursa area as well.  She has pain with plantarflexion against resistance but  is still able to do it.  Thompson test is negative.  Imaging: Limited diagnostic ultrasound left Achilles: There is fusiform swelling of the distal Achilles, but I do not see a full-thickness tear.  She still has retrocalcaneal bursal swelling.  No hyperemia on power Doppler imaging.  Assessment & Plan: 1.  Chronic left heel pain with acute worsening, Achilles tendinopathy but no definite tear as well as retrocalcaneal bursitis. -Discussed options with patient, she does not have health insurance right now and does not think she could do physical therapy with iontophoresis.  Due to the chronicity of her symptoms she would like to consult with Dr. Sharol Given for surgical options. -If he does not feel surgery is indicated, we could contemplate a one-time cortisone injection into the retrocalcaneal bursa but she would need to be very cautious with activities for a couple weeks afterward.     Procedures: No procedures performed  No notes on file     PMFS History: Patient Active Problem List   Diagnosis Date Noted  . Overdose 11/24/2016  . Elevated serum creatinine 11/24/2016  . Seizure disorder ([place]) 08/24/2016  . Intractable chronic migraine without aura and with status migrainosus 01/24/2016  . Major depressive disorder, recurrent episode, moderate ([place]) 09/13/2012   Past Medical History:  Diagnosis Date  . Asthma   . Depression   . Drug-seeking  behavior   . Headache(784.0)   . Kidney stones   . Migraine   . Narcotic abuse (HCC)   . Seizures (HCC)   . Suicide attempt [facility](HCC)     Family History  Problem Relation Age of Onset  . Obesity Mother   . Diabetes Mother   . Hypertension Mother   . Depression Mother   . Suicidality Mother   . Diabetes Father   . Hypertension Father   . Heart attack Father     Past Surgical History:  Procedure Laterality Date  . ANKLE SURGERY     left  . ANTERIOR CERVICAL DECOMP/DISCECTOMY FUSION  2018  . APPENDECTOMY    . CHOLECYSTECTOMY    .  KNEE SURGERY    . TONSILLECTOMY     Social History   Occupational History  . Not on file  Tobacco Use  . Smoking status: Never Smoker  . Smokeless tobacco: Never Used  Substance and Sexual Activity  . Alcohol use: Yes    Comment: Rare  . Drug use: No  . Sexual activity: Yes

## 2018-12-30 ENCOUNTER — Other Ambulatory Visit: Payer: Self-pay

## 2018-12-30 ENCOUNTER — Ambulatory Visit (INDEPENDENT_AMBULATORY_CARE_PROVIDER_SITE_OTHER): Payer: Self-pay | Admitting: Orthopedic Surgery

## 2018-12-30 ENCOUNTER — Encounter: Payer: Self-pay | Admitting: Orthopedic Surgery

## 2018-12-30 VITALS — Ht 68.0 in | Wt 300.0 lb

## 2018-12-30 DIAGNOSIS — S86112A Strain of other muscle(s) and tendon(s) of posterior muscle group at lower leg level, left leg, initial encounter: Secondary | ICD-10-CM

## 2018-12-30 NOTE — Progress Notes (Signed)
Office Visit Note   Patient: Victoria Middleton           Date of Birth: 1981-05-15           MRN: [id number] Visit Date: 12/30/2018              Requested by: No referring provider defined for this encounter. PCP: Patient, No Pcp Per  Chief Complaint  Patient presents with  . Left Ankle - Pain    Original injury 08/14/2018, new injury 12/17/2018      HPI: Patient is a 38 year old woman who was seen for initial evaluation after sustaining a injury to her Achilles while playing basketball.  Patient states that she had a remote injury where she felt a pop in her calf and then felt a new pop in her calf when she was playing basketball.  She states she was trying to jump.  She was placed in a splint in the emergency department was followed up with Dr. Junius Roads.  An ultrasound showed edema but no definitive tearing patient is seen for initial evaluation.  Assessment & Plan: Visit Diagnoses:  1. Gastrocnemius tear, left, initial encounter     Plan: Patient clinically has a tear of the medial head of the musculotendinous junction of the gastrocnemius muscle.  We will place her in a fracture boot with 2 9/16'  heel lifts weightbearing as tolerated follow-up in the office in 4 weeks.  Follow-Up Instructions: Return in about 4 weeks (around 01/27/2019).   Ortho Exam  Patient is alert, oriented, no adenopathy, well-dressed, normal affect, normal respiratory effort. Examination patient has a palpable pulse she has tenderness with dorsiflexion of the ankle.  Palpation the Achilles tendon is palpable and intact.  With the patient kneeling compression of the calf reproduces good plantar flexion of the foot.  She is maximally tender to palpation over the medial head of the gastrocnemius muscle at the musculotendinous junction consistent with a injury of the medial head.  Imaging: No results found. No images are attached to the encounter.  Labs: Lab Results  Component Value Date   REPTSTATUS  08/28/2011 FINAL 08/27/2011   CULT  08/27/2011    Multiple bacterial morphotypes present, none predominant. Suggest appropriate recollection if clinically indicated. Note: NO GROUP B STREP (S.AGALACTIAE) ISOLATED     Lab Results  Component Value Date   ALBUMIN 3.9 08/09/2018   ALBUMIN 3.9 01/18/2018   ALBUMIN 4.8 07/18/2017    Body mass index is 45.61 kg/m.  Orders:  No orders of the defined types were placed in this encounter.  No orders of the defined types were placed in this encounter.    Procedures: No procedures performed  Clinical Data: No additional findings.  ROS:  All other systems negative, except as noted in the HPI. Review of Systems  Objective: Vital Signs: Ht 5\' 8"  (1.727 m)   Wt 300 lb (136.1 kg)   LMP 12/10/2018   BMI 45.61 kg/m   Specialty Comments:  No specialty comments available.  PMFS History: Patient Active Problem List   Diagnosis Date Noted  . Overdose 11/24/2016  . Elevated serum creatinine 11/24/2016  . Seizure disorder ([place]) 08/24/2016  . Intractable chronic migraine without aura and with status migrainosus 01/24/2016  . Major depressive disorder, recurrent episode, moderate ([place]) 09/13/2012   Past Medical History:  Diagnosis Date  . Asthma   . Depression   . Drug-seeking behavior   . Headache(784.0)   . Kidney stones   . Migraine   .  Narcotic abuse (HCC)   . Seizures (HCC)   . Suicide attempt [facility](HCC)     Family History  Problem Relation Age of Onset  . Obesity Mother   . Diabetes Mother   . Hypertension Mother   . Depression Mother   . Suicidality Mother   . Diabetes Father   . Hypertension Father   . Heart attack Father     Past Surgical History:  Procedure Laterality Date  . ANKLE SURGERY     left  . ANTERIOR CERVICAL DECOMP/DISCECTOMY FUSION  2018  . APPENDECTOMY    . CHOLECYSTECTOMY    . KNEE SURGERY    . TONSILLECTOMY     Social History   Occupational History  . Not on file  Tobacco Use  .  Smoking status: Never Smoker  . Smokeless tobacco: Never Used  Substance and Sexual Activity  . Alcohol use: Yes    Comment: Rare  . Drug use: No  . Sexual activity: Yes

## 2019-01-03 ENCOUNTER — Encounter: Payer: Self-pay | Admitting: Orthopedic Surgery

## 2019-01-03 ENCOUNTER — Other Ambulatory Visit: Payer: Self-pay | Admitting: Physician Assistant

## 2019-01-03 DIAGNOSIS — S86112A Strain of other muscle(s) and tendon(s) of posterior muscle group at lower leg level, left leg, initial encounter: Secondary | ICD-10-CM

## 2019-01-03 MED ORDER — METHOCARBAMOL 500 MG PO TABS: 500.0000 mg | ORAL_TABLET | Freq: Four times a day (QID) | ORAL | 2 refills | Status: DC | PRN

## 2019-01-03 MED ORDER — HYDROCODONE-ACETAMINOPHEN 5-325 MG PO TABS: 1.0000 | ORAL_TABLET | Freq: Four times a day (QID) | ORAL | 0 refills | Status: DC | PRN

## 2019-01-03 NOTE — Telephone Encounter (Signed)
Spoke with the patient and she reports pain over the gastroc muscle especially at night. She reports she feels like there are spasms and then sharper pain at night. The tramadol did not help with the pain. She would like to try something different for pain and spasms.   Advised have ordered hydrocodone for pain and robaxin for muscle spasms sent to [facility].  [place]

## 2019-01-23 ENCOUNTER — Encounter: Payer: Self-pay | Admitting: Orthopedic Surgery

## 2019-01-23 ENCOUNTER — Other Ambulatory Visit: Payer: Self-pay

## 2019-01-23 ENCOUNTER — Ambulatory Visit (INDEPENDENT_AMBULATORY_CARE_PROVIDER_SITE_OTHER): Payer: Self-pay | Admitting: Physician Assistant

## 2019-01-23 VITALS — Ht 68.0 in | Wt 300.0 lb

## 2019-01-23 DIAGNOSIS — S86112D Strain of other muscle(s) and tendon(s) of posterior muscle group at lower leg level, left leg, subsequent encounter: Secondary | ICD-10-CM

## 2019-01-24 ENCOUNTER — Encounter: Payer: Self-pay | Admitting: Physician Assistant

## 2019-01-24 NOTE — Progress Notes (Signed)
Office Visit Note   Patient: Victoria SealMelissa M Myers           Date of Birth: 1980/08/20           MRN: [id number] Visit Date: 01/23/2019              Requested by: No referring provider defined for this encounter. PCP: Patient, No Pcp Per  Chief Complaint  Patient presents with  . Left Ankle - Follow-up      HPI: The patient is a 38 year old woman who is seen for follow-up following a left gastrocnemius muscle tear.  She has been wearing the fracture boot and weightbearing as tolerated.  She reports some minimal residual pain but overall is been doing much better.  Assessment & Plan: Visit Diagnoses:  1. Gastrocnemius tear, left, subsequent encounter     Plan: Recommended the patient begin ambulating a regular shoe out of her fracture boot to tolerance.  If she is tolerating this well she can begin Achilles stretching exercises and this was demonstrated for the patient.  She will follow-up here on an as-needed basis.  Follow-Up Instructions: Return if symptoms worsen or fail to improve.   Ortho Exam  Patient is alert, oriented, no adenopathy, well-dressed, normal affect, normal respiratory effort. Patient is tender to palpation over the posterior calf.  Compression of the calf does produce a good plantarflexion of the foot.  She is able to dorsiflex actively to neutral but not beyond.  Achilles stretching exercises were demonstrated for the patient.  She has good pedal pulses.  There is no signs of cellulitis or infection.  Imaging: No results found. No images are attached to the encounter.  Labs: Lab Results  Component Value Date   REPTSTATUS 08/28/2011 FINAL 08/27/2011   CULT  08/27/2011    Multiple bacterial morphotypes present, none predominant. Suggest appropriate recollection if clinically indicated. Note: NO GROUP B STREP (S.AGALACTIAE) ISOLATED     Lab Results  Component Value Date   ALBUMIN 3.9 08/09/2018   ALBUMIN 3.9 01/18/2018   ALBUMIN 4.8 07/18/2017     No results found for: MG No results found for: VD25OH  No results found for: PREALBUMIN CBC EXTENDED Latest Ref Rng & Units 08/09/2018 01/18/2018 11/13/2017  WBC 4.0 - 10.5 K/uL 6.9 8.6 8.9  RBC 3.87 - 5.11 MIL/uL 4.45 4.38 4.68  HGB 12.0 - 15.0 g/dL 11.8(L) 12.2 13.1  HCT 36.0 - 46.0 % 38.3 37.4 41.5  PLT 150 - 400 K/uL 188 199 216  NEUTROABS 1.7 - 7.7 K/uL 5.8 - 7.2  LYMPHSABS 0.7 - 4.0 K/uL 0.7 - 1.0     Body mass index is 45.61 kg/m.  Orders:  No orders of the defined types were placed in this encounter.  No orders of the defined types were placed in this encounter.    Procedures: No procedures performed  Clinical Data: No additional findings.  ROS:  All other systems negative, except as noted in the HPI. Review of Systems  Objective: Vital Signs: Ht 5\' 8"  (1.727 m)   Wt 300 lb (136.1 kg)   BMI 45.61 kg/m   Specialty Comments:  No specialty comments available.  PMFS History: Patient Active Problem List   Diagnosis Date Noted  . Gastrocnemius tear, left, initial encounter 12/30/2018  . Overdose 11/24/2016  . Elevated serum creatinine 11/24/2016  . Seizure disorder (HCC) 08/24/2016  . Intractable chronic migraine without aura and with status migrainosus 01/24/2016  . Major depressive disorder, recurrent episode, moderate (HCC) 09/13/2012  Past Medical History:  Diagnosis Date  . Asthma   . Depression   . Drug-seeking behavior   . Headache(784.0)   . Kidney stones   . Migraine   . Narcotic abuse ([place])   . Seizures ([place])   . Suicide attempt [facility])     Family History  Problem Relation Age of Onset  . Obesity Mother   . Diabetes Mother   . Hypertension Mother   . Depression Mother   . Suicidality Mother   . Diabetes Father   . Hypertension Father   . Heart attack Father     Past Surgical History:  Procedure Laterality Date  . ANKLE SURGERY     left  . ANTERIOR CERVICAL DECOMP/DISCECTOMY FUSION  2018  . APPENDECTOMY    . CHOLECYSTECTOMY     . KNEE SURGERY    . TONSILLECTOMY     Social History   Occupational History  . Not on file  Tobacco Use  . Smoking status: Never Smoker  . Smokeless tobacco: Never Used  Substance and Sexual Activity  . Alcohol use: Yes    Comment: Rare  . Drug use: No  . Sexual activity: Yes

## 2019-10-07 ENCOUNTER — Encounter: Payer: Self-pay | Admitting: Emergency Medicine

## 2019-10-07 ENCOUNTER — Other Ambulatory Visit: Payer: Self-pay

## 2019-10-07 ENCOUNTER — Ambulatory Visit
Admission: EM | Admit: 2019-10-07 | Discharge: 2019-10-07 | Disposition: A | Discharge status: Home / Self Care | Payer: BC Managed Care – PPO

## 2019-10-07 DIAGNOSIS — N751 Abscess of Bartholin's gland: Secondary | ICD-10-CM | POA: Diagnosis not present

## 2019-10-07 NOTE — Discharge Instructions (Signed)
Keep area(s) clean and dry. Apply hot compress / take warm bath for 5-10 minutes 3-5 times daily. Take tylenol/ibuprofen (alternate around the clock) for pain/inflammation. Return for worsening pain, redness, swelling, discharge, fever.  Helpful prevention tips: Keep nails short to avoid secondary skin infections. Use new, clean razors when shaving. Avoid antiperspirants - look for deodorants without aluminum. Avoid wearing underwire bras as this can irritate the area further.

## 2019-10-07 NOTE — ED Provider Notes (Signed)
[facility]    CSN: [id number] Arrival date & time: 10/07/19  1030      History   Chief Complaint Chief Complaint  Patient presents with  . Abscess    HPI Victoria Middleton is a 39 y.o. female with history of obesity presenting for concern of abscess to left labia.  Patient also noting swollen lymph nodes around the area for 2 days.  Patient feels like she had sudden worsening of pain and increase in size since Sunday.  Patient states her husband was able to "mash on it "and have some bloody pus come out.  Denies active discharge, pelvic or vaginal pain, discharge, fever.  No arthralgias, myalgias.   Past Medical History:  Diagnosis Date  . Asthma   . Depression   . Drug-seeking behavior   . Headache(784.0)   . Kidney stones   . Migraine   . Narcotic abuse (HCC)   . Seizures (HCC)   . Suicide attempt [facility])     Patient Active Problem List   Diagnosis Date Noted  . Gastrocnemius tear, left, initial encounter 12/30/2018  . Overdose 11/24/2016  . Elevated serum creatinine 11/24/2016  . Seizure disorder (HCC) 08/24/2016  . Intractable chronic migraine without aura and with status migrainosus 01/24/2016  . Major depressive disorder, recurrent episode, moderate (HCC) 09/13/2012    Past Surgical History:  Procedure Laterality Date  . ANKLE SURGERY     left  . ANTERIOR CERVICAL DECOMP/DISCECTOMY FUSION  2018  . APPENDECTOMY    . CHOLECYSTECTOMY    . KNEE SURGERY    . TONSILLECTOMY      OB History    Gravida  0   Para      Term      Preterm      AB      Living        SAB      TAB      Ectopic      Multiple      Live Births               Home Medications    Prior to Admission medications   Medication Sig Start Date End Date Taking? Authorizing Provider  FLUoxetine (PROZAC) 40 MG capsule Take 40 mg by mouth daily.   Yes [provider]  gabapentin (NEURONTIN) 300 MG capsule Take 300 mg by mouth at bedtime.   Yes [provider]  acetaminophen (TYLENOL) 500 MG tablet Take 1,000 mg by mouth every 4 (four) hours as needed for mild pain.     [provider]    Family History Family History  Problem Relation Age of Onset  . Obesity Mother   . Diabetes Mother   . Hypertension Mother   . Depression Mother   . Suicidality Mother   . Diabetes Father   . Hypertension Father   . Heart attack Father     Social History Social History   Tobacco Use  . Smoking status: Never Smoker  . Smokeless tobacco: Never Used  Substance Use Topics  . Alcohol use: Yes    Comment: Rare  . Drug use: No     Allergies   Compazine [prochlorperazine edisylate], Sulfa antibiotics, Aripiprazole, Benadryl [diphenhydramine hcl], Ketorolac tromethamine, Naprosyn [naproxen], Nsaids, Penicillins, and Toradol [ketorolac tromethamine]   Review of Systems As per HPI   Physical Exam Triage Vital Signs ED Triage Vitals  Enc Vitals Group     BP  Pulse      Resp      Temp      Temp src      SpO2      Weight      Height      Head Circumference      Peak Flow      Pain Score      Pain Loc      Pain Edu?      Excl. in [place]?    No data found.  Updated Vital Signs BP 133/82 (BP Location: Left Arm)   Pulse 85   Temp 98.2 F (36.8 C) (Temporal)   Resp 18   LMP 09/24/2019   SpO2 98%   Visual Acuity Right Eye Distance:   Left Eye Distance:   Bilateral Distance:    Right Eye Near:   Left Eye Near:    Bilateral Near:     Physical Exam Constitutional:      General: She is not in acute distress. HENT:     Head: Normocephalic and atraumatic.  Eyes:     General: No scleral icterus.    Pupils: Pupils are equal, round, and reactive to light.  Cardiovascular:     Rate and Rhythm: Normal rate.  Pulmonary:     Effort: Pulmonary effort is normal.  Genitourinary:    Comments: Left Bartholin's gland edematous, tender to palpation.  Small open wound without active discharge noted. Skin:     Coloration: Skin is not jaundiced or pale.  Neurological:     Mental Status: She is alert and oriented to person, place, and time.      UC Treatments / Results  Labs (all labs ordered are listed, but only abnormal results are displayed) Labs Reviewed - No data to display  EKG   Radiology No results found.  Procedures Incision and Drainage  Date/Time: 10/07/2019 11:14 AM Performed by: Quincy Sheehan, PA-C Authorized by: Quincy Sheehan, PA-C   Consent:    Consent obtained:  Verbal   Consent given by:  Patient   Risks discussed:  Bleeding, incomplete drainage, pain, damage to other organs and infection   Alternatives discussed:  No treatment and observation Universal protocol:    Patient identity confirmed:  Verbally with patient Location:    Type:  Bartholin cyst   Size:  3 cm   Location:  Anogenital   Anogenital location:  Bartholin's gland (LEFT) Pre-procedure details:    Skin preparation:  Betadine Anesthesia (see MAR for exact dosages):    Anesthesia method:  Local infiltration   Local anesthetic:  Lidocaine 2% w/o epi Procedure type:    Complexity:  Simple Procedure details:    Needle aspiration: no     Incision types:  Stab incision   Incision depth:  Subcutaneous   Scalpel blade:  11   Wound management:  Probed and deloculated   Drainage:  Purulent and bloody   Drainage amount:  Scant   Wound treatment:  Wound left open   Packing materials:  None Post-procedure details:    Patient tolerance of procedure:  Tolerated well, no immediate complications   (including critical care time)  Medications Ordered in UC Medications - No data to display  Initial Impression / Assessment and Plan / UC Course  I have reviewed the triage vital signs and the nursing notes.  Pertinent labs & imaging results that were available during my care of the patient were reviewed by me and considered in my medical decision making (see chart for  details).      Patient febrile, nontoxic in office today.  I&D attempted: Scant purulent, mostly sanguinous discharge noted.  Patient tolerates well.  Will defer antibiotics at this time, treat supportively as outlined below.  Return precautions discussed, patient verbalized understanding and is agreeable to plan. Final Clinical Impressions(s) / UC Diagnoses   Final diagnoses:  Bartholin's gland abscess     Discharge Instructions     Keep area(s) clean and dry. Apply hot compress / take warm bath for 5-10 minutes 3-5 times daily. Take tylenol/ibuprofen (alternate around the clock) for pain/inflammation. Return for worsening pain, redness, swelling, discharge, fever.  Helpful prevention tips: Keep nails short to avoid secondary skin infections. Use new, clean razors when shaving. Avoid antiperspirants - look for deodorants without aluminum. Avoid wearing underwire bras as this can irritate the area further.     ED Prescriptions    None     PDMP not reviewed this encounter.   Hall-Potvin, [place], [place] 10/07/19 1116

## 2019-10-07 NOTE — ED Triage Notes (Signed)
Patient presents to [facility] for assessment of abscess to patient's groin, and c/o swelling lymph nodes x 2 days, worsening in size and pain since Sunday.

## 2019-10-15 ENCOUNTER — Other Ambulatory Visit: Payer: Self-pay

## 2019-10-15 ENCOUNTER — Ambulatory Visit
Admission: EM | Admit: 2019-10-15 | Discharge: 2019-10-15 | Disposition: A | Discharge status: Home / Self Care | Payer: BC Managed Care – PPO | Attending: Emergency Medicine | Admitting: Emergency Medicine

## 2019-10-15 ENCOUNTER — Ambulatory Visit (INDEPENDENT_AMBULATORY_CARE_PROVIDER_SITE_OTHER): Payer: BC Managed Care – PPO

## 2019-10-15 ENCOUNTER — Encounter: Payer: Self-pay | Admitting: Emergency Medicine

## 2019-10-15 DIAGNOSIS — M25571 Pain in right ankle and joints of right foot: Secondary | ICD-10-CM | POA: Diagnosis not present

## 2019-10-15 DIAGNOSIS — S93401A Sprain of unspecified ligament of right ankle, initial encounter: Secondary | ICD-10-CM

## 2019-10-15 DIAGNOSIS — W109XXA Fall (on) (from) unspecified stairs and steps, initial encounter: Secondary | ICD-10-CM

## 2019-10-15 NOTE — Discharge Instructions (Addendum)
Recommend RICE: rest, ice, compression, elevation as needed for pain.   Cold therapy (ice packs) can be used to help swelling both after injury and after prolonged use of areas of chronic pain/aches.  For pain: recommend 350 mg-1000 mg of Tylenol (acetaminophen) and/or 200 mg - 800 mg of Advil (ibuprofen, Motrin) every 8 hours as needed.  May alternate between the two throughout the day as they are generally safe to take together.  DO NOT exceed more than 3000 mg of Tylenol or 3200 mg of ibuprofen in a 24 hour period as this could damage your stomach, kidneys, liver, or increase your bleeding risk. 

## 2019-10-15 NOTE — ED Provider Notes (Signed)
[facility]    CSN: [id number] Arrival date & time: 10/15/19  1257      History   Chief Complaint Chief Complaint  Patient presents with  . Ankle Pain  . Foot Pain    HPI Candid Bovey Stoffer is a 39 y.o. female with history of obesity presenting for right ankle pain and swelling to medial aspect status post fall.  Patient states that her crocs slipped when she was going down the stairs.  Endorsing inversion injury and hearing a pop.  Patient having difficulty weightbearing.  States this happened 20 minutes PTA.  Denies numbness, deformity, head trauma, LOC.  Has not taken anything for pain.   Past Medical History:  Diagnosis Date  . Asthma   . Depression   . Drug-seeking behavior   . Headache(784.0)   . Kidney stones   . Migraine   . Narcotic abuse ([place])   . Seizures ([place])   . Suicide attempt [facility])     Patient Active Problem List   Diagnosis Date Noted  . Gastrocnemius tear, left, initial encounter 12/30/2018  . Overdose 11/24/2016  . Elevated serum creatinine 11/24/2016  . Seizure disorder ([place]) 08/24/2016  . Intractable chronic migraine without aura and with status migrainosus 01/24/2016  . Major depressive disorder, recurrent episode, moderate ([place]) 09/13/2012    Past Surgical History:  Procedure Laterality Date  . ANKLE SURGERY     left  . ANTERIOR CERVICAL DECOMP/DISCECTOMY FUSION  2018  . APPENDECTOMY    . CHOLECYSTECTOMY    . KNEE SURGERY    . TONSILLECTOMY      OB History    Gravida  0   Para      Term      Preterm      AB      Living        SAB      TAB      Ectopic      Multiple      Live Births               Home Medications    Prior to Admission medications   Medication Sig Start Date End Date Taking? Authorizing Provider  acetaminophen (TYLENOL) 500 MG tablet Take 1,000 mg by mouth every 4 (four) hours as needed for mild pain.     [provider]  FLUoxetine (PROZAC) 40 MG capsule Take 40 mg by  mouth daily.    [provider]  gabapentin (NEURONTIN) 300 MG capsule Take 300 mg by mouth at bedtime.    [provider]    Family History Family History  Problem Relation Age of Onset  . Obesity Mother   . Diabetes Mother   . Hypertension Mother   . Depression Mother   . Suicidality Mother   . Diabetes Father   . Hypertension Father   . Heart attack Father     Social History Social History   Tobacco Use  . Smoking status: Never Smoker  . Smokeless tobacco: Never Used  Substance Use Topics  . Alcohol use: Yes    Comment: Rare  . Drug use: No     Allergies   Compazine [prochlorperazine edisylate], Sulfa antibiotics, Aripiprazole, Benadryl [diphenhydramine hcl], Ketorolac tromethamine, Naprosyn [naproxen], Nsaids, Penicillins, and Toradol [ketorolac tromethamine]   Review of Systems As per HPI   Physical Exam Triage Vital Signs ED Triage Vitals  Enc Vitals Group     BP      Pulse  Resp      Temp      Temp src      SpO2      Weight      Height      Head Circumference      Peak Flow      Pain Score      Pain Loc      Pain Edu?      Excl. in [place]?    No data found.  Updated Vital Signs BP (!) 142/82 (BP Location: Left Arm)   Pulse 85   Temp 97.8 F (36.6 C) (Temporal)   Resp 18   LMP 10/09/2019   SpO2 95%   Visual Acuity Right Eye Distance:   Left Eye Distance:   Bilateral Distance:    Right Eye Near:   Left Eye Near:    Bilateral Near:     Physical Exam Constitutional:      General: She is not in acute distress. HENT:     Head: Normocephalic and atraumatic.  Eyes:     General: No scleral icterus.    Pupils: Pupils are equal, round, and reactive to light.  Cardiovascular:     Rate and Rhythm: Normal rate.  Pulmonary:     Effort: Pulmonary effort is normal.  Musculoskeletal:        General: Swelling, tenderness and signs of injury present. No deformity.     Comments: Decreased ROM second to pain.  TTP to medial  malleoli.  Pt unable to bear weight on right foot second to pain.  Skin:    Coloration: Skin is not jaundiced or pale.  Neurological:     Mental Status: She is alert and oriented to person, place, and time.      UC Treatments / Results  Labs (all labs ordered are listed, but only abnormal results are displayed) Labs Reviewed - No data to display  EKG   Radiology DG Ankle Complete Right  Result Date: 10/15/2019 CLINICAL DATA:  Degen range of motion, right ankle pain EXAM: RIGHT ANKLE - COMPLETE 3+ VIEW COMPARISON:  None. FINDINGS: There is no evidence of fracture, dislocation, or joint effusion. There is no evidence of arthropathy or other focal bone abnormality. Soft tissues are unremarkable. IMPRESSION: Negative. Electronically Signed   By: Elige Ko   On: 10/15/2019 13:39    Procedures Procedures (including critical care time)  Medications Ordered in UC Medications - No data to display  Initial Impression / Assessment and Plan / UC Course  I have reviewed the triage vital signs and the nursing notes.  Pertinent labs & imaging results that were available during my care of the patient were reviewed by me and considered in my medical decision making (see chart for details).     Right ankle x-ray done office, reviewed by me radiology: Negative for fracture, dislocation, effusion.  Soft tissues unremarkable.  Reviewed findings with patient verbalized understanding.  Offered Ace wrap, crutches to help with ambulation and compression: Patient declined, stating she has these at home.  Provided contact info for ortho follow up if needed.  Return precautions discussed, patient verbalized understanding and is agreeable to plan. Final Clinical Impressions(s) / UC Diagnoses   Final diagnoses:  Sprain of right ankle, unspecified ligament, initial encounter     Discharge Instructions     Recommend RICE: rest, ice, compression, elevation as needed for pain.   Cold therapy (ice  packs) can be used to help swelling both after injury and after prolonged  use of areas of chronic pain/aches.  For pain: recommend 350 mg-1000 mg of Tylenol (acetaminophen) and/or 200 mg - 800 mg of Advil (ibuprofen, Motrin) every 8 hours as needed.  May alternate between the two throughout the day as they are generally safe to take together.  DO NOT exceed more than 3000 mg of Tylenol or 3200 mg of ibuprofen in a 24 hour period as this could damage your stomach, kidneys, liver, or increase your bleeding risk.    ED Prescriptions    None     I have reviewed the PDMP during this encounter.   Hall-Potvin, [place], [place] 10/15/19 1411

## 2019-10-15 NOTE — ED Notes (Signed)
Patient able to ambulate with limp bearing weight on husband in lobby.

## 2019-10-15 NOTE — ED Triage Notes (Addendum)
Pt presents to [facility] for assessment after having her right foot slip out from under her on the steps, she felt a twisting sensation and a "pop".  Now having medial ankle pain with radiation to foot.  Patient using wheelchair at time of visit, states she is unable to bear weight.

## 2019-10-22 DIAGNOSIS — R42 Dizziness and giddiness: Secondary | ICD-10-CM | POA: Diagnosis not present

## 2019-11-01 ENCOUNTER — Emergency Department
Admission: EM | Admit: 2019-11-01 | Discharge: 2019-11-01 | Disposition: A | Discharge status: Home / Self Care | Payer: BC Managed Care – PPO | Attending: Emergency Medicine | Admitting: Emergency Medicine

## 2019-11-01 ENCOUNTER — Other Ambulatory Visit: Payer: Self-pay

## 2019-11-01 ENCOUNTER — Emergency Department: Payer: BC Managed Care – PPO

## 2019-11-01 DIAGNOSIS — Y9301 Activity, walking, marching and hiking: Secondary | ICD-10-CM | POA: Diagnosis not present

## 2019-11-01 DIAGNOSIS — Y999 Unspecified external cause status: Secondary | ICD-10-CM | POA: Diagnosis not present

## 2019-11-01 DIAGNOSIS — X500XXA Overexertion from strenuous movement or load, initial encounter: Secondary | ICD-10-CM | POA: Insufficient documentation

## 2019-11-01 DIAGNOSIS — M25572 Pain in left ankle and joints of left foot: Secondary | ICD-10-CM | POA: Diagnosis not present

## 2019-11-01 DIAGNOSIS — I10 Essential (primary) hypertension: Secondary | ICD-10-CM | POA: Diagnosis not present

## 2019-11-01 DIAGNOSIS — S99912A Unspecified injury of left ankle, initial encounter: Secondary | ICD-10-CM | POA: Diagnosis not present

## 2019-11-01 DIAGNOSIS — Y92838 Other recreation area as the place of occurrence of the external cause: Secondary | ICD-10-CM | POA: Diagnosis not present

## 2019-11-01 DIAGNOSIS — J45909 Unspecified asthma, uncomplicated: Secondary | ICD-10-CM | POA: Diagnosis not present

## 2019-11-01 DIAGNOSIS — Z79899 Other long term (current) drug therapy: Secondary | ICD-10-CM | POA: Diagnosis not present

## 2019-11-01 DIAGNOSIS — R52 Pain, unspecified: Secondary | ICD-10-CM | POA: Diagnosis not present

## 2019-11-01 MED ORDER — TRAMADOL HCL 50 MG PO TABS
50.0000 mg | ORAL_TABLET | Freq: Once | ORAL | Status: AC
  Administered 2019-11-01: 50 mg via ORAL
  Filled 2019-11-01: qty 1

## 2019-11-01 MED ORDER — TRAMADOL HCL 50 MG PO TABS: 50.0000 mg | ORAL_TABLET | Freq: Four times a day (QID) | ORAL | 0 refills | Status: AC | PRN

## 2019-11-01 NOTE — ED Notes (Signed)
Pt refused walking boot/air cast and crutches ordered due to already having both at home. Pt states she will be fine until she gets home and then use what she has. EDP aware.

## 2019-11-01 NOTE — Discharge Instructions (Addendum)
Follow discharge care instructions and wear a walking boot as directed.  Follow-up orthopedic if no improvement in 3 to 5 days.  Be advised medication may cause drowsiness.

## 2019-11-01 NOTE — ED Triage Notes (Signed)
Pt arrives to ER via [facility]. Pt was walking on a trail, stepped on a root and rolled L ankle. Pt c/o L posterior ankle pain. A&O, in wheelchair.

## 2019-11-01 NOTE — ED Provider Notes (Signed)
[facility] Emergency Department Provider Note   ____________________________________________   First MD Initiated Contact with Patient 11/01/19 1628     (approximate)  I have reviewed the triage vital signs and the nursing notes.   HISTORY  Chief Complaint Ankle Pain    HPI Victoria Middleton is a 39 y.o. female patient presents with posterior left ankle pain secondary to rolling her ankle while hiking trail prior to arrival.  Patient is unable to bear weight secondary to pain.  Patient rates the pain 6/10.  Patient described pain as "achy".  Patient placed in a posterior leg splint by EMS prior to arrival.  Patient complaint is complicated by morbid obesity.  History also reveals patient had a left posterior calcaneal screw.         Past Medical History:  Diagnosis Date  . Asthma   . Depression   . Drug-seeking behavior   . Headache(784.0)   . Kidney stones   . Migraine   . Narcotic abuse (HCC)   . Seizures (HCC)   . Suicide attempt [facility])     Patient Active Problem List   Diagnosis Date Noted  . Gastrocnemius tear, left, initial encounter 12/30/2018  . Overdose 11/24/2016  . Elevated serum creatinine 11/24/2016  . Seizure disorder (HCC) 08/24/2016  . Intractable chronic migraine without aura and with status migrainosus 01/24/2016  . Major depressive disorder, recurrent episode, moderate (HCC) 09/13/2012    Past Surgical History:  Procedure Laterality Date  . ANKLE SURGERY     left  . ANTERIOR CERVICAL DECOMP/DISCECTOMY FUSION  2018  . APPENDECTOMY    . CHOLECYSTECTOMY    . KNEE SURGERY    . TONSILLECTOMY      Prior to Admission medications   Medication Sig Start Date End Date Taking? Authorizing Provider  acetaminophen (TYLENOL) 500 MG tablet Take 1,000 mg by mouth every 4 (four) hours as needed for mild pain.     [provider]  FLUoxetine (PROZAC) 40 MG capsule Take 40 mg by mouth daily.    [provider]   gabapentin (NEURONTIN) 300 MG capsule Take 300 mg by mouth at bedtime.    [provider]  traMADol (ULTRAM) 50 MG tablet Take 1 tablet (50 mg total) by mouth every 6 (six) hours as needed. 11/01/19 10/31/20  Joni Reining, PA-C    Allergies Compazine [prochlorperazine edisylate], Sulfa antibiotics, Aripiprazole, Benadryl [diphenhydramine hcl], Ketorolac tromethamine, Naprosyn [naproxen], Nsaids, Penicillins, and Toradol [ketorolac tromethamine]  Family History  Problem Relation Age of Onset  . Obesity Mother   . Diabetes Mother   . Hypertension Mother   . Depression Mother   . Suicidality Mother   . Diabetes Father   . Hypertension Father   . Heart attack Father     Social History Social History   Tobacco Use  . Smoking status: Never Smoker  . Smokeless tobacco: Never Used  Substance Use Topics  . Alcohol use: Yes    Comment: Rare  . Drug use: No    Review of Systems Constitutional: No fever/chills Eyes: No visual changes. ENT: No sore throat. Cardiovascular: Denies chest pain. Respiratory: Denies shortness of breath. Gastrointestinal: No abdominal pain.  No nausea, no vomiting.  No diarrhea.  No constipation. Genitourinary: Negative for dysuria. Musculoskeletal: Left ankle pain.   Skin: Negative for rash. Neurological: Negative for headaches, focal weakness or numbness. Psychiatric:  Depression Allergic/Immunilogical: Compazine, penicillin, sulfa antibiotics, Benadryl, tramadol, naproxen, and other NSAIDs. ____________________________________________   PHYSICAL  EXAM:  VITAL SIGNS: ED Triage Vitals  Enc Vitals Group     BP 11/01/19 1616 105/72     Pulse Rate 11/01/19 1614 81     Resp 11/01/19 1614 18     Temp 11/01/19 1614 99.1 F (37.3 C)     Temp Source 11/01/19 1614 Oral     SpO2 11/01/19 1614 100 %     Weight 11/01/19 1614 (!) 330 lb (149.7 kg)     Height 11/01/19 1614 5\' 8"  (1.727 m)     Head Circumference --      Peak Flow --      Pain  Score 11/01/19 1614 6     Pain Loc --      Pain Edu? --      Excl. in [place]? --    Constitutional: Alert and oriented. Well appearing and in no acute distress. Cardiovascular: Normal rate, regular rhythm. Grossly normal heart sounds.  Good peripheral circulation. Respiratory: Normal respiratory effort.  No retractions. Lungs CTAB. Musculoskeletal: Body habitus limits the exam.  No obvious deformity to the left ankle.  Moderate guarding palpation of the insertion point of the Achilles tendon.  Patient was able to plantar and dorsiflex. Neurologic:  Normal speech and language. No gross focal neurologic deficits are appreciated. No gait instability. Skin:  Skin is warm, dry and intact. No rash noted.  No abrasion or ecchymosis.  No erythema. Psychiatric: Mood and affect are normal. Speech and behavior are normal.  ____________________________________________   LABS (all labs ordered are listed, but only abnormal results are displayed)  Labs Reviewed - No data to display ____________________________________________  EKG   ____________________________________________  RADIOLOGY  ED MD interpretation:    Official radiology report(s): DG Ankle Complete Left  Result Date: 11/01/2019 CLINICAL DATA:  Rolled ankle while hiking. Posterior ankle pain. EXAM: LEFT ANKLE COMPLETE - 3+ VIEW COMPARISON:  Ankle radiograph 12/17/2018 FINDINGS: There is no evidence of fracture, dislocation, or joint effusion. Posterior calcaneal screw unchanged. No periprosthetic lucency. No focal soft tissue abnormality. IMPRESSION: No fracture or subluxation of the left ankle. Electronically Signed   By: Keith Rake M.D.   On: 11/01/2019 16:51    ____________________________________________   PROCEDURES  Procedure(s) performed (including Critical Care):  Procedures   ____________________________________________   INITIAL IMPRESSION / ASSESSMENT AND PLAN / ED COURSE  As part of my medical decision  making, I reviewed the following data within the [place]     Patient presents with left ankle pain secondary to a "rolling" incident prior to arrival.  Discussed no acute findings on x-ray of the left ankle.  Patient has moderate guarding with palpation of the insertion point of the Achilles.  Advised a walking boot which patient states he already has at home.  Patient given discharge care instruction advised follow orthopedic in her home station if no improvement or worsening of complaint.   Victoria Middleton was evaluated in Emergency Department on 11/01/2019 for the symptoms described in the history of present illness. She was evaluated in the context of the global COVID-19 pandemic, which necessitated consideration that the patient might be at risk for infection with the SARS-CoV-2 virus that causes COVID-19. Institutional protocols and algorithms that pertain to the evaluation of patients at risk for COVID-19 are in a state of rapid change based on information released by regulatory bodies including the CDC and federal and state organizations. These policies and algorithms were followed during the patient's care in the ED.  ____________________________________________   FINAL CLINICAL IMPRESSION(S) / ED DIAGNOSES  Final diagnoses:  Acute left ankle pain     ED Discharge Orders         Ordered    traMADol (ULTRAM) 50 MG tablet  Every 6 hours PRN     11/01/19 1757           Note:  This document was prepared using Dragon voice recognition software and may include unintentional dictation errors.    Joni Reining, PA-C 11/01/19 1800    Sharman Cheek, MD 11/01/19 2342

## 2019-11-01 NOTE — ED Notes (Signed)
Pt twisted left ankle while hiking. Pt with left leg elevated, ice applied.

## 2019-11-01 NOTE — ED Notes (Signed)
First Nurse Note: Pt to ED via [facility]. Pt was hiking on a trail today and hurt her ankle. Pt was hiking 2.5 miles. Per EMS pt rate pain 10/10. BP 154/60, HR 84. Pt denies calling. Pt is in NAD

## 2021-08-08 DIAGNOSIS — Z6841 Body Mass Index (BMI) 40.0 and over, adult: Secondary | ICD-10-CM | POA: Diagnosis not present

## 2021-08-08 DIAGNOSIS — Z131 Encounter for screening for diabetes mellitus: Secondary | ICD-10-CM | POA: Diagnosis not present

## 2021-08-08 DIAGNOSIS — F339 Major depressive disorder, recurrent, unspecified: Secondary | ICD-10-CM | POA: Diagnosis not present

## 2021-08-08 DIAGNOSIS — Z13220 Encounter for screening for lipoid disorders: Secondary | ICD-10-CM | POA: Diagnosis not present

## 2021-08-08 DIAGNOSIS — G43111 Migraine with aura, intractable, with status migrainosus: Secondary | ICD-10-CM | POA: Diagnosis not present

## 2021-08-08 DIAGNOSIS — F411 Generalized anxiety disorder: Secondary | ICD-10-CM | POA: Diagnosis not present

## 2021-08-08 DIAGNOSIS — Z1329 Encounter for screening for other suspected endocrine disorder: Secondary | ICD-10-CM | POA: Diagnosis not present

## 2021-08-19 DIAGNOSIS — N924 Excessive bleeding in the premenopausal period: Secondary | ICD-10-CM | POA: Diagnosis not present

## 2021-08-19 DIAGNOSIS — F909 Attention-deficit hyperactivity disorder, unspecified type: Secondary | ICD-10-CM | POA: Diagnosis not present

## 2021-08-19 DIAGNOSIS — N939 Abnormal uterine and vaginal bleeding, unspecified: Secondary | ICD-10-CM | POA: Diagnosis not present

## 2021-08-19 DIAGNOSIS — R41840 Attention and concentration deficit: Secondary | ICD-10-CM | POA: Diagnosis not present

## 2021-08-26 DIAGNOSIS — N200 Calculus of kidney: Secondary | ICD-10-CM | POA: Diagnosis not present

## 2021-08-26 DIAGNOSIS — R102 Pelvic and perineal pain: Secondary | ICD-10-CM | POA: Diagnosis not present

## 2021-08-26 DIAGNOSIS — R319 Hematuria, unspecified: Secondary | ICD-10-CM | POA: Diagnosis not present

## 2021-09-05 DIAGNOSIS — F909 Attention-deficit hyperactivity disorder, unspecified type: Secondary | ICD-10-CM | POA: Diagnosis not present

## 2021-09-05 DIAGNOSIS — F411 Generalized anxiety disorder: Secondary | ICD-10-CM | POA: Diagnosis not present

## 2021-09-05 DIAGNOSIS — F339 Major depressive disorder, recurrent, unspecified: Secondary | ICD-10-CM | POA: Diagnosis not present

## 2021-09-05 DIAGNOSIS — Z1331 Encounter for screening for depression: Secondary | ICD-10-CM | POA: Diagnosis not present

## 2021-09-13 DIAGNOSIS — G43831 Menstrual migraine, intractable, with status migrainosus: Secondary | ICD-10-CM | POA: Diagnosis not present

## 2021-09-13 DIAGNOSIS — R110 Nausea: Secondary | ICD-10-CM | POA: Diagnosis not present

## 2021-09-13 DIAGNOSIS — G43901 Migraine, unspecified, not intractable, with status migrainosus: Secondary | ICD-10-CM | POA: Diagnosis not present

## 2021-09-19 DIAGNOSIS — N946 Dysmenorrhea, unspecified: Secondary | ICD-10-CM | POA: Diagnosis not present

## 2021-09-19 DIAGNOSIS — R42 Dizziness and giddiness: Secondary | ICD-10-CM | POA: Diagnosis not present

## 2021-09-19 DIAGNOSIS — Z1231 Encounter for screening mammogram for malignant neoplasm of breast: Secondary | ICD-10-CM | POA: Diagnosis not present

## 2021-09-19 DIAGNOSIS — Z6841 Body Mass Index (BMI) 40.0 and over, adult: Secondary | ICD-10-CM | POA: Diagnosis not present

## 2021-09-19 DIAGNOSIS — R102 Pelvic and perineal pain: Secondary | ICD-10-CM | POA: Diagnosis not present

## 2021-09-19 DIAGNOSIS — R55 Syncope and collapse: Secondary | ICD-10-CM | POA: Diagnosis not present

## 2021-09-19 DIAGNOSIS — N920 Excessive and frequent menstruation with regular cycle: Secondary | ICD-10-CM | POA: Diagnosis not present

## 2021-09-23 DIAGNOSIS — R110 Nausea: Secondary | ICD-10-CM | POA: Diagnosis not present

## 2021-09-23 DIAGNOSIS — G43111 Migraine with aura, intractable, with status migrainosus: Secondary | ICD-10-CM | POA: Diagnosis not present

## 2021-09-26 DIAGNOSIS — R059 Cough, unspecified: Secondary | ICD-10-CM | POA: Diagnosis not present

## 2021-09-26 DIAGNOSIS — R509 Fever, unspecified: Secondary | ICD-10-CM | POA: Diagnosis not present

## 2021-09-26 DIAGNOSIS — J069 Acute upper respiratory infection, unspecified: Secondary | ICD-10-CM | POA: Diagnosis not present

## 2021-10-04 DIAGNOSIS — N920 Excessive and frequent menstruation with regular cycle: Secondary | ICD-10-CM | POA: Diagnosis not present

## 2021-10-04 DIAGNOSIS — M25572 Pain in left ankle and joints of left foot: Secondary | ICD-10-CM | POA: Diagnosis not present

## 2021-10-04 DIAGNOSIS — R55 Syncope and collapse: Secondary | ICD-10-CM | POA: Diagnosis not present

## 2021-10-04 DIAGNOSIS — M79672 Pain in left foot: Secondary | ICD-10-CM | POA: Diagnosis not present

## 2021-10-06 DIAGNOSIS — M25572 Pain in left ankle and joints of left foot: Secondary | ICD-10-CM | POA: Diagnosis not present

## 2021-10-06 DIAGNOSIS — S82892D Other fracture of left lower leg, subsequent encounter for closed fracture with routine healing: Secondary | ICD-10-CM | POA: Diagnosis not present

## 2021-10-12 DIAGNOSIS — N920 Excessive and frequent menstruation with regular cycle: Secondary | ICD-10-CM | POA: Diagnosis not present

## 2021-10-12 DIAGNOSIS — E860 Dehydration: Secondary | ICD-10-CM | POA: Diagnosis not present

## 2021-10-13 DIAGNOSIS — S9032XA Contusion of left foot, initial encounter: Secondary | ICD-10-CM | POA: Diagnosis not present

## 2021-10-14 DIAGNOSIS — N6012 Diffuse cystic mastopathy of left breast: Secondary | ICD-10-CM | POA: Diagnosis not present

## 2021-10-14 DIAGNOSIS — R928 Other abnormal and inconclusive findings on diagnostic imaging of breast: Secondary | ICD-10-CM | POA: Diagnosis not present

## 2021-10-17 DIAGNOSIS — F909 Attention-deficit hyperactivity disorder, unspecified type: Secondary | ICD-10-CM | POA: Diagnosis not present

## 2021-10-17 DIAGNOSIS — F339 Major depressive disorder, recurrent, unspecified: Secondary | ICD-10-CM | POA: Diagnosis not present

## 2021-10-17 DIAGNOSIS — F411 Generalized anxiety disorder: Secondary | ICD-10-CM | POA: Diagnosis not present

## 2021-10-27 DIAGNOSIS — S9032XA Contusion of left foot, initial encounter: Secondary | ICD-10-CM | POA: Diagnosis not present

## 2021-10-27 DIAGNOSIS — M25572 Pain in left ankle and joints of left foot: Secondary | ICD-10-CM | POA: Diagnosis not present

## 2021-11-24 DIAGNOSIS — G43511 Persistent migraine aura without cerebral infarction, intractable, with status migrainosus: Secondary | ICD-10-CM | POA: Diagnosis not present

## 2021-12-02 DIAGNOSIS — F411 Generalized anxiety disorder: Secondary | ICD-10-CM | POA: Diagnosis not present

## 2022-01-09 DIAGNOSIS — G43119 Migraine with aura, intractable, without status migrainosus: Secondary | ICD-10-CM | POA: Diagnosis not present

## 2022-01-10 ENCOUNTER — Encounter: Payer: Self-pay | Admitting: *Deleted
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
## 2022-01-11 ENCOUNTER — Ambulatory Visit: Payer: Self-pay | Admitting: Psychiatry

## 2022-01-30 DIAGNOSIS — F909 Attention-deficit hyperactivity disorder, unspecified type: Secondary | ICD-10-CM | POA: Diagnosis not present

## 2022-01-30 DIAGNOSIS — Z6841 Body Mass Index (BMI) 40.0 and over, adult: Secondary | ICD-10-CM | POA: Diagnosis not present

## 2022-01-30 DIAGNOSIS — G43119 Migraine with aura, intractable, without status migrainosus: Secondary | ICD-10-CM | POA: Diagnosis not present

## 2022-02-06 ENCOUNTER — Telehealth: Payer: Self-pay | Admitting: Psychiatry

## 2022-02-06 NOTE — Telephone Encounter (Signed)
LVM and mychart msg informing pt of need to reschedule 7/25 appointment - MD out

## 2022-02-07 ENCOUNTER — Ambulatory Visit: Payer: Self-pay | Admitting: Psychiatry

## 2022-03-10 DIAGNOSIS — G43109 Migraine with aura, not intractable, without status migrainosus: Secondary | ICD-10-CM | POA: Diagnosis not present

## 2022-03-18 DIAGNOSIS — Z880 Allergy status to penicillin: Secondary | ICD-10-CM | POA: Diagnosis not present

## 2022-03-18 DIAGNOSIS — M79672 Pain in left foot: Secondary | ICD-10-CM | POA: Diagnosis not present

## 2022-03-18 DIAGNOSIS — Z882 Allergy status to sulfonamides status: Secondary | ICD-10-CM | POA: Diagnosis not present

## 2022-03-18 DIAGNOSIS — X58XXXA Exposure to other specified factors, initial encounter: Secondary | ICD-10-CM | POA: Diagnosis not present

## 2022-03-18 DIAGNOSIS — R609 Edema, unspecified: Secondary | ICD-10-CM | POA: Diagnosis not present

## 2022-03-18 DIAGNOSIS — S99912A Unspecified injury of left ankle, initial encounter: Secondary | ICD-10-CM | POA: Diagnosis not present

## 2022-03-18 DIAGNOSIS — Z888 Allergy status to other drugs, medicaments and biological substances status: Secondary | ICD-10-CM | POA: Diagnosis not present

## 2022-03-18 DIAGNOSIS — M25572 Pain in left ankle and joints of left foot: Secondary | ICD-10-CM | POA: Diagnosis not present

## 2022-03-18 DIAGNOSIS — W19XXXA Unspecified fall, initial encounter: Secondary | ICD-10-CM | POA: Diagnosis not present

## 2022-04-10 DIAGNOSIS — R519 Headache, unspecified: Secondary | ICD-10-CM | POA: Diagnosis not present

## 2022-04-10 DIAGNOSIS — R059 Cough, unspecified: Secondary | ICD-10-CM | POA: Diagnosis not present

## 2022-05-22 DIAGNOSIS — F419 Anxiety disorder, unspecified: Secondary | ICD-10-CM | POA: Diagnosis not present

## 2022-05-22 DIAGNOSIS — F902 Attention-deficit hyperactivity disorder, combined type: Secondary | ICD-10-CM | POA: Diagnosis not present

## 2022-05-22 DIAGNOSIS — G43109 Migraine with aura, not intractable, without status migrainosus: Secondary | ICD-10-CM | POA: Diagnosis not present

## 2022-05-22 DIAGNOSIS — Z23 Encounter for immunization: Secondary | ICD-10-CM | POA: Diagnosis not present

## 2022-06-28 ENCOUNTER — Other Ambulatory Visit: Payer: Self-pay

## 2022-06-28 MED ORDER — QULIPTA 60 MG PO TABS: 1.0000 | ORAL_TABLET | Freq: Every day | ORAL | 0 refills | Status: DC

## 2022-07-21 ENCOUNTER — Other Ambulatory Visit: Payer: Self-pay

## 2022-07-21 MED ORDER — QULIPTA 60 MG PO TABS: 1.0000 | ORAL_TABLET | Freq: Every day | ORAL | 0 refills | Status: DC

## 2022-08-02 ENCOUNTER — Other Ambulatory Visit: Payer: Self-pay

## 2022-08-02 MED ORDER — UBRELVY 100 MG PO TABS: 100.0000 mg | ORAL_TABLET | Freq: Every day | ORAL | 3 refills | Status: DC

## 2022-08-10 ENCOUNTER — Encounter: Payer: Self-pay | Admitting: Internal Medicine

## 2022-08-10 ENCOUNTER — Ambulatory Visit: Payer: BC Managed Care – PPO | Admitting: Internal Medicine

## 2022-08-10 VITALS — BP 126/80 | HR 76 | Temp 97.5°F | Resp 18 | Ht 68.0 in | Wt 316.0 lb

## 2022-08-10 DIAGNOSIS — R3129 Other microscopic hematuria: Secondary | ICD-10-CM

## 2022-08-10 DIAGNOSIS — N200 Calculus of kidney: Secondary | ICD-10-CM | POA: Diagnosis not present

## 2022-08-10 DIAGNOSIS — R319 Hematuria, unspecified: Secondary | ICD-10-CM | POA: Insufficient documentation

## 2022-08-10 LAB — POCT URINALYSIS DIPSTICK
Bilirubin, UA: NEGATIVE
Glucose, UA: NEGATIVE
Ketones, UA: NEGATIVE
Leukocytes, UA: NEGATIVE
Nitrite, UA: NEGATIVE
Protein, UA: NEGATIVE
Spec Grav, UA: 1.02 (ref 1.010–1.025)
Urobilinogen, UA: 0.2 E.U./dL
pH, UA: 7 (ref 5.0–8.0)

## 2022-08-10 MED ORDER — TAMSULOSIN HCL 0.4 MG PO CAPS: 0.4000 mg | ORAL_CAPSULE | Freq: Every day | ORAL | Status: DC

## 2022-08-10 NOTE — Assessment & Plan Note (Signed)
Need to repeat urine analysis next month for resolution of hematuria.

## 2022-08-10 NOTE — Assessment & Plan Note (Signed)
She will drink plenty of water.  I will start her on Flomax 0.4 mg daily that will help to get rid of stone.  If it is not better then we will do the ultrasound.

## 2022-08-10 NOTE — Progress Notes (Signed)
   Acute Office Visit  Subjective:     Patient ID: Victoria Middleton, female    DOB: [birth year], 42 y.o.   MRN: [id number]  Chief Complaint  Patient presents with   Nephrolithiasis    Kidney Stones    HPI Patient is in today for creased pain in the right abdomen radiating to right groin.  She has a history of kidney stone.  She says that pain started last night and did severe and she thinks that her kidney stone is acting up again.  No burning urine and no fever or chills.  Her urine analysis has large blood and she denies having any periods.  There is no sign of infection.  Review of Systems  Genitourinary:  Positive for flank pain and hematuria.        Objective:    BP 126/80 (BP Location: Left Leg, Patient Position: Sitting, Cuff Size: Large)   Pulse 76   Temp (!) 97.5 F (36.4 C)   Resp 18   Ht 5\' 8"  (1.727 m)   Wt (!) 316 lb (143.3 kg)   SpO2 98%   BMI 48.05 kg/m    Physical Exam Constitutional:      Appearance: She is obese.  Abdominal:     General: Bowel sounds are normal.     Palpations: Abdomen is soft.     Tenderness: There is no abdominal tenderness. There is no rebound.     Results for orders placed or performed in visit on 08/10/22  POCT urinalysis dipstick  Result Value Ref Range   Color, UA Brown    Clarity, UA Cloudy    Glucose, UA Negative Negative   Bilirubin, UA Negative    Ketones, UA Negative    Spec Grav, UA 1.020 1.010 - 1.025   Blood, UA Large    pH, UA 7.0 5.0 - 8.0   Protein, UA Negative Negative   Urobilinogen, UA 0.2 0.2 or 1.0 E.U./dL   Nitrite, UA Negative    Leukocytes, UA Negative Negative   Appearance     Odor          Assessment & Plan:   Problem List Items Addressed This Visit   None Visit Diagnoses     Kidney stones    -  Primary   Relevant Orders   POCT urinalysis dipstick   Uric acid nephrolithiasis           No orders of the defined types were placed in this encounter.   No follow-ups on  file.  Garwin Brothers, MD

## 2022-09-01 ENCOUNTER — Other Ambulatory Visit: Payer: Self-pay | Admitting: Internal Medicine

## 2022-09-01 MED ORDER — DIAZEPAM 5 MG PO TABS: 5.0000 mg | ORAL_TABLET | Freq: Three times a day (TID) | ORAL | 1 refills | Status: DC | PRN

## 2022-09-13 ENCOUNTER — Other Ambulatory Visit: Payer: Self-pay | Admitting: Internal Medicine

## 2022-09-13 MED ORDER — AMPHETAMINE-DEXTROAMPHET ER 20 MG PO CP24: 20.0000 mg | ORAL_CAPSULE | Freq: Every morning | ORAL | 0 refills | Status: DC

## 2022-09-22 ENCOUNTER — Encounter: Payer: Self-pay | Admitting: Internal Medicine

## 2022-09-22 ENCOUNTER — Ambulatory Visit: Payer: BC Managed Care – PPO | Admitting: Internal Medicine

## 2022-09-22 VITALS — BP 124/74 | HR 91 | Temp 97.5°F | Resp 18 | Ht 68.0 in

## 2022-09-22 DIAGNOSIS — J069 Acute upper respiratory infection, unspecified: Secondary | ICD-10-CM | POA: Diagnosis not present

## 2022-09-22 MED ORDER — METHYLPREDNISOLONE SODIUM SUCC 40 MG IJ SOLR: 80.0000 mg | Freq: Once | INTRAMUSCULAR | Status: DC

## 2022-09-22 MED ORDER — METHYLPREDNISOLONE 4 MG PO TBPK: ORAL_TABLET | ORAL | 0 refills | Status: DC

## 2022-09-22 MED ORDER — METHYLPREDNISOLONE SODIUM SUCC 40 MG IJ SOLR
80.0000 mg | Freq: Once | INTRAMUSCULAR | Status: AC
  Administered 2022-09-22: 80 mg via INTRAMUSCULAR

## 2022-09-22 MED ORDER — AZITHROMYCIN 250 MG PO TABS: ORAL_TABLET | ORAL | 0 refills | Status: AC

## 2022-09-22 NOTE — Addendum Note (Signed)
Addended by: Elva Breaker on: 09/22/2022 03:56 PM   Modules accepted: Orders

## 2022-09-22 NOTE — Progress Notes (Signed)
   Acute Office Visit  Subjective:     Patient ID: Victoria Middleton, female    DOB: 1981/02/11, 42 y.o.   MRN: [id number]  Chief Complaint  Patient presents with   office visit    Coughing X 2 days     HPI Patient is in today for cough, nasal congestion since Monday. She noted sore throat and test was negative for strep and COVID negative X 2. She is feeling very tired and lethargic. She has more cough. She has childhood asthma but have out grown it.   Review of Systems  Constitutional: Negative.   HENT:  Positive for congestion and sore throat.   Eyes: Negative.   Respiratory:  Positive for cough.   Cardiovascular: Negative.   Gastrointestinal: Negative.         Objective:    BP 124/74 (BP Location: Left Arm, Patient Position: Sitting, Cuff Size: Normal)   Pulse 91   Temp (!) 97.5 F (36.4 C)   Resp 18   Ht 5\' 8"  (1.727 m)   SpO2 98%   BMI 48.05 kg/m    Physical Exam Constitutional:      Appearance: Normal appearance.  HENT:     Head: Normocephalic and atraumatic.  Cardiovascular:     Rate and Rhythm: Normal rate and regular rhythm.     Heart sounds: Normal heart sounds.  Pulmonary:     Effort: Pulmonary effort is normal.     Breath sounds: Normal breath sounds.  Neurological:     Mental Status: She is alert.     No results found for any visits on 09/22/22.      Assessment & Plan:   Problem List Items Addressed This Visit       Respiratory   Upper respiratory infection - Primary    Her increase cough is suggestive of asthma exacerbation so will give solumedrol 80 mg IM, and medrol dose pack, zithromax and she will drink plenty of water.       No orders of the defined types were placed in this encounter.   No follow-ups on file.  Garwin Brothers, MD

## 2022-09-22 NOTE — Assessment & Plan Note (Signed)
Her increase cough is suggestive of asthma exacerbation so will give solumedrol 80 mg IM, and medrol dose pack, zithromax and she will drink plenty of water.

## 2022-10-06 ENCOUNTER — Other Ambulatory Visit: Payer: Self-pay

## 2022-10-07 ENCOUNTER — Emergency Department (HOSPITAL_BASED_OUTPATIENT_CLINIC_OR_DEPARTMENT_OTHER): Payer: BC Managed Care – PPO

## 2022-10-07 ENCOUNTER — Encounter (HOSPITAL_BASED_OUTPATIENT_CLINIC_OR_DEPARTMENT_OTHER): Payer: Self-pay

## 2022-10-07 ENCOUNTER — Emergency Department (HOSPITAL_BASED_OUTPATIENT_CLINIC_OR_DEPARTMENT_OTHER)
Admission: EM | Admit: 2022-10-07 | Discharge: 2022-10-08 | Disposition: A | Discharge status: Home / Self Care | Payer: BC Managed Care – PPO | Attending: Emergency Medicine | Admitting: Emergency Medicine

## 2022-10-07 ENCOUNTER — Other Ambulatory Visit: Payer: Self-pay

## 2022-10-07 DIAGNOSIS — W19XXXA Unspecified fall, initial encounter: Secondary | ICD-10-CM

## 2022-10-07 DIAGNOSIS — S63501A Unspecified sprain of right wrist, initial encounter: Secondary | ICD-10-CM | POA: Diagnosis not present

## 2022-10-07 DIAGNOSIS — W1789XA Other fall from one level to another, initial encounter: Secondary | ICD-10-CM | POA: Diagnosis not present

## 2022-10-07 DIAGNOSIS — Y9301 Activity, walking, marching and hiking: Secondary | ICD-10-CM | POA: Insufficient documentation

## 2022-10-07 DIAGNOSIS — S8002XA Contusion of left knee, initial encounter: Secondary | ICD-10-CM | POA: Diagnosis not present

## 2022-10-07 DIAGNOSIS — S6991XA Unspecified injury of right wrist, hand and finger(s), initial encounter: Secondary | ICD-10-CM | POA: Diagnosis not present

## 2022-10-07 DIAGNOSIS — M545 Low back pain, unspecified: Secondary | ICD-10-CM | POA: Diagnosis not present

## 2022-10-07 DIAGNOSIS — S5011XA Contusion of right forearm, initial encounter: Secondary | ICD-10-CM | POA: Insufficient documentation

## 2022-10-07 DIAGNOSIS — S0990XA Unspecified injury of head, initial encounter: Secondary | ICD-10-CM | POA: Insufficient documentation

## 2022-10-07 DIAGNOSIS — S8992XA Unspecified injury of left lower leg, initial encounter: Secondary | ICD-10-CM | POA: Diagnosis not present

## 2022-10-07 DIAGNOSIS — J45909 Unspecified asthma, uncomplicated: Secondary | ICD-10-CM | POA: Insufficient documentation

## 2022-10-07 DIAGNOSIS — Y9239 Other specified sports and athletic area as the place of occurrence of the external cause: Secondary | ICD-10-CM | POA: Insufficient documentation

## 2022-10-07 MED ORDER — HYDROCODONE-ACETAMINOPHEN 5-325 MG PO TABS
1.0000 | ORAL_TABLET | Freq: Once | ORAL | Status: AC
  Administered 2022-10-08: 1 via ORAL
  Filled 2022-10-07: qty 1

## 2022-10-07 NOTE — ED Provider Notes (Signed)
[facility] Provider Note   CSN: [id number] Arrival date & time: 10/07/22  2240     History  Chief Complaint  Patient presents with   Victoria Middleton is a 42 y.o. female.  With PMH of depression, asthma, PTSD, history of narcotic abuse presenting after fall with right wrist left knee and lower back pain.   Fall       Home Medications Prior to Admission medications   Medication Sig Start Date End Date Taking? Authorizing Provider  acetaminophen (TYLENOL) 500 MG tablet Take 1,000 mg by mouth every 4 (four) hours as needed for mild pain.     [provider]  amphetamine-dextroamphetamine (ADDERALL XR) 20 MG 24 hr capsule Take 1 capsule (20 mg total) by mouth every morning. 09/13/22   Garwin Brothers, MD  diazepam (VALIUM) 5 MG tablet Take 1 tablet (5 mg total) by mouth every 8 (eight) hours as needed for anxiety. 09/01/22 09/01/23  Garwin Brothers, MD  FLUoxetine (PROZAC) 40 MG capsule Take 40 mg by mouth daily.    [provider]  gabapentin (NEURONTIN) 300 MG capsule Take 300 mg by mouth at bedtime.    [provider]  methylPREDNISolone (MEDROL DOSEPAK) 4 MG TBPK tablet Use as directed 09/22/22   Garwin Brothers, MD  QULIPTA 60 MG TABS Take 1 tablet by mouth daily. 07/21/22   Garwin Brothers, MD  tranexamic acid (LYSTEDA) 650 MG TABS tablet Take 1,300 mg by mouth 3 (three) times daily. 07/05/22   [provider]  UBRELVY 100 MG TABS Take 100 mg by mouth daily. Take 1 tablet by mouth 1 time. May repeat dose 1 time after 2 hours as needed. Not to exceed 200mg  in 24 hours 08/02/22   Garwin Brothers, MD      Allergies    Compazine [prochlorperazine edisylate], Sulfa antibiotics, Aripiprazole, Benadryl [diphenhydramine hcl], Ketorolac tromethamine, Naprosyn [naproxen], Nsaids, Penicillins, and Toradol [ketorolac tromethamine]    Review of Systems   Review of Systems  Physical Exam Updated Vital Signs BP (!) 168/105    Pulse 76   Temp 98.3 F (36.8 C)   Resp 18   Ht 5\' 8"  (1.727 m)   Wt (!) 145.2 kg   LMP 10/05/2022   SpO2 100%   BMI 48.66 kg/m  Physical Exam  ED Results / Procedures / Treatments   Labs (all labs ordered are listed, but only abnormal results are displayed) Labs Reviewed - No data to display  EKG None  Radiology No results found.  Procedures Procedures  {Document cardiac monitor, telemetry assessment procedure when appropriate:1}  Medications Ordered in ED Medications - No data to display  ED Course/ Medical Decision Making/ A&P   {   Click here for ABCD2, HEART and other calculatorsREFRESH Note before signing :1}                          Medical Decision Making  ***  {Document critical care time when appropriate:1} {Document review of labs and clinical decision tools ie heart score, Chads2Vasc2 etc:1}  {Document your independent review of radiology images, and any outside records:1} {Document your discussion with family members, caretakers, and with consultants:1} {Document social determinants of health affecting pt's care:1} {Document your decision making why or why not admission, treatments were needed:1} Final Clinical Impression(s) / ED Diagnoses Final diagnoses:  None    Rx / DC Orders ED Discharge Orders  None       

## 2022-10-07 NOTE — ED Triage Notes (Signed)
POV from home, A&O x 4, GCS 15  Fall from bleachers to gym floor approx noon today. Sts 3 bleachers. C/o Right wrist, left knee, and lower back pain. Pt sts that she's unsure of LOC but husband sts that she was talking immediately after it happened. Pt sts headache as well. Denies blood thinners.

## 2022-10-08 DIAGNOSIS — S8992XA Unspecified injury of left lower leg, initial encounter: Secondary | ICD-10-CM | POA: Diagnosis not present

## 2022-10-08 DIAGNOSIS — Z043 Encounter for examination and observation following other accident: Secondary | ICD-10-CM | POA: Diagnosis not present

## 2022-10-08 DIAGNOSIS — S99912A Unspecified injury of left ankle, initial encounter: Secondary | ICD-10-CM | POA: Diagnosis not present

## 2022-10-08 DIAGNOSIS — S638X1A Sprain of other part of right wrist and hand, initial encounter: Secondary | ICD-10-CM | POA: Diagnosis not present

## 2022-10-08 DIAGNOSIS — S3992XA Unspecified injury of lower back, initial encounter: Secondary | ICD-10-CM | POA: Diagnosis not present

## 2022-10-08 DIAGNOSIS — S6991XA Unspecified injury of right wrist, hand and finger(s), initial encounter: Secondary | ICD-10-CM | POA: Diagnosis not present

## 2022-10-08 DIAGNOSIS — S59911A Unspecified injury of right forearm, initial encounter: Secondary | ICD-10-CM | POA: Diagnosis not present

## 2022-10-08 MED ORDER — METHOCARBAMOL 500 MG PO TABS: 500.0000 mg | ORAL_TABLET | Freq: Two times a day (BID) | ORAL | 0 refills | Status: DC | PRN

## 2022-10-08 NOTE — Discharge Instructions (Signed)
You have been seen in the Emergency Department (ED) today following a fall.  Your workup today did not reveal any injuries that require you to stay in the hospital. You can expect to be stiff and sore for the next several days.  Please take Tylenol or Motrin as needed for pain, but only as written on the box.  Your x-ray did not show any signs of an acute fracture or dislocation. Your pain and swelling is likely from a sprain.   To take care of your right wrist and left knee while you heal,  R- rest injury as needed, do not overexert yourself I - ice the affected area, 20 minutes every 4-6 hours, this will help with any swelling C- compress the affected area as needed to apply support and to decrease any swelling E- elevate frequently when in sitting or laying position.    Please follow up with the orthopedist as needed regarding today's ED visit and your recent accident.   Call your doctor or return to the ED if you develop a sudden or severe headache, confusion, slurred speech, facial droop, weakness or numbness in any arm or leg,  extreme fatigue, vomiting more than two times, severe abdominal pain, difficulty breathing worsening pain, swelling, redness, warmth, numbness, loss of sensation, fever, chills, vomiting, or any other symptoms you find concerning. or any other concerning signs or symptoms.

## 2022-10-09 ENCOUNTER — Other Ambulatory Visit: Payer: Self-pay

## 2022-10-09 MED ORDER — TRANEXAMIC ACID 650 MG PO TABS: 1300.0000 mg | ORAL_TABLET | Freq: Three times a day (TID) | ORAL | 0 refills | Status: DC

## 2022-10-11 ENCOUNTER — Ambulatory Visit: Payer: BC Managed Care – PPO | Admitting: Internal Medicine

## 2022-10-11 ENCOUNTER — Encounter: Payer: Self-pay | Admitting: Internal Medicine

## 2022-10-11 VITALS — HR 109 | Temp 98.2°F | Resp 18 | Ht 68.0 in

## 2022-10-11 DIAGNOSIS — M25531 Pain in right wrist: Secondary | ICD-10-CM

## 2022-10-12 ENCOUNTER — Other Ambulatory Visit: Payer: Self-pay | Admitting: Internal Medicine

## 2022-10-12 DIAGNOSIS — M25531 Pain in right wrist: Secondary | ICD-10-CM | POA: Insufficient documentation

## 2022-10-12 MED ORDER — TRAMADOL HCL 50 MG PO TABS: 50.0000 mg | ORAL_TABLET | Freq: Three times a day (TID) | ORAL | 0 refills | Status: DC | PRN

## 2022-10-12 NOTE — Assessment & Plan Note (Signed)
She cannot put or take NSAID so she will put ice packing.  She will continue to use splint and take Tylenol arthritis for pain.  Her x-ray was negative for any fracture.

## 2022-10-12 NOTE — Progress Notes (Signed)
   Acute Office Visit  Subjective:     Patient ID: Victoria Middleton, female    DOB: [birth year], 42 y.o.   MRN: [id number]  Chief Complaint  Patient presents with   Wrist Injury    Wrist Injury    Patient is in today for pain and swelling right wrist started after she fell 5 days ago.  Her x-ray was negative for any fracture.  She is allergic to NSAID and using splint but says that it hurt and she is right-handed.  She has a tenderness on her hand and wrist. Review of Systems  Musculoskeletal:        Right wrist swollen        Objective:    Pulse (!) 109   Temp 98.2 F (36.8 C)   Resp 18   Ht 5\' 8"  (1.727 m)   LMP 10/05/2022   SpO2 96%   BMI 48.66 kg/m    Physical Exam Constitutional:      Appearance: Normal appearance.  Musculoskeletal:        General: Swelling and tenderness present.     Comments: Right wrist and hand is swollen and tender to touch.  Neurological:     Mental Status: She is alert.     No results found for any visits on 10/11/22.      Assessment & Plan:   Problem List Items Addressed This Visit       Other   Wrist pain, acute, right - Primary    She cannot put or take NSAID so she will put ice packing.  She will continue to use splint and take Tylenol arthritis for pain.  Her x-ray was negative for any fracture.       No orders of the defined types were placed in this encounter.   No follow-ups on file.  Garwin Brothers, MD

## 2022-10-12 NOTE — Progress Notes (Signed)
Patient called today and says she has an appointment made with orthopedic for increased pain in her right wrist. She is asking something stronger for pain as she is allergic to NSAID. I have sent tramadol 50 mg q 8 hour PRN

## 2022-10-18 ENCOUNTER — Other Ambulatory Visit: Payer: Self-pay | Admitting: Internal Medicine

## 2022-10-18 MED ORDER — AMPHETAMINE-DEXTROAMPHET ER 20 MG PO CP24: 20.0000 mg | ORAL_CAPSULE | Freq: Every morning | ORAL | 0 refills | Status: DC

## 2022-10-26 ENCOUNTER — Other Ambulatory Visit: Payer: Self-pay | Admitting: Internal Medicine

## 2022-10-26 MED ORDER — DIAZEPAM 5 MG PO TABS: 5.0000 mg | ORAL_TABLET | Freq: Three times a day (TID) | ORAL | 2 refills | Status: DC | PRN

## 2022-12-07 DIAGNOSIS — G43909 Migraine, unspecified, not intractable, without status migrainosus: Secondary | ICD-10-CM | POA: Diagnosis not present

## 2022-12-07 DIAGNOSIS — R4182 Altered mental status, unspecified: Secondary | ICD-10-CM | POA: Diagnosis not present

## 2022-12-07 DIAGNOSIS — R519 Headache, unspecified: Secondary | ICD-10-CM | POA: Diagnosis not present

## 2022-12-13 ENCOUNTER — Encounter: Payer: Self-pay | Admitting: Internal Medicine

## 2022-12-13 ENCOUNTER — Ambulatory Visit: Payer: BC Managed Care – PPO | Admitting: Internal Medicine

## 2022-12-13 VITALS — BP 144/88 | HR 77 | Temp 97.4°F | Resp 18 | Wt 334.0 lb

## 2022-12-13 DIAGNOSIS — I10 Essential (primary) hypertension: Secondary | ICD-10-CM

## 2022-12-13 DIAGNOSIS — F419 Anxiety disorder, unspecified: Secondary | ICD-10-CM

## 2022-12-13 MED ORDER — LORAZEPAM 0.5 MG PO TABS: 0.5000 mg | ORAL_TABLET | Freq: Three times a day (TID) | ORAL | 1 refills | Status: DC | PRN

## 2022-12-13 MED ORDER — PROPRANOLOL HCL 20 MG PO TABS: 20.0000 mg | ORAL_TABLET | Freq: Two times a day (BID) | ORAL | 11 refills | Status: DC

## 2022-12-13 NOTE — Assessment & Plan Note (Signed)
Discussed with her to take half tablet as needed (0.5 mg)
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
## 2022-12-13 NOTE — Assessment & Plan Note (Signed)
Start her on propranolol 20 mg twice a day.  This will also help with blood pressure and migraine prophylaxis.

## 2023-02-08 ENCOUNTER — Other Ambulatory Visit: Payer: Self-pay | Admitting: Internal Medicine

## 2023-02-08 MED ORDER — LORAZEPAM 0.5 MG PO TABS: 0.5000 mg | ORAL_TABLET | Freq: Three times a day (TID) | ORAL | 0 refills | Status: AC | PRN

## 2023-03-16 ENCOUNTER — Ambulatory Visit: Payer: Self-pay | Admitting: Student

## 2023-06-19 ENCOUNTER — Ambulatory Visit (HOSPITAL_COMMUNITY)
Admission: EM | Admit: 2023-06-19 | Discharge: 2023-06-19 | Disposition: A | Discharge status: Home / Self Care | Payer: BC Managed Care – PPO | Attending: Family Medicine | Admitting: Family Medicine

## 2023-06-19 ENCOUNTER — Encounter (HOSPITAL_COMMUNITY): Payer: Self-pay | Admitting: *Deleted

## 2023-06-19 DIAGNOSIS — J4521 Mild intermittent asthma with (acute) exacerbation: Secondary | ICD-10-CM

## 2023-06-19 MED ORDER — BENZONATATE 200 MG PO CAPS: 200.0000 mg | ORAL_CAPSULE | Freq: Three times a day (TID) | ORAL | 0 refills | Status: DC | PRN

## 2023-06-19 MED ORDER — ALBUTEROL SULFATE HFA 108 (90 BASE) MCG/ACT IN AERS: 2.0000 | INHALATION_SPRAY | RESPIRATORY_TRACT | 0 refills | Status: DC | PRN

## 2023-06-19 MED ORDER — METHYLPREDNISOLONE 4 MG PO TBPK: ORAL_TABLET | ORAL | 0 refills | Status: DC

## 2023-06-19 NOTE — ED Triage Notes (Signed)
Pt states that she has cough and congestion x 3 days. She not been taking any meds.

## 2023-06-19 NOTE — Discharge Instructions (Signed)
You were seen today for upper respiratory symptoms.  Your exam is normal, so we will not do an xray today.  I am treating you for an asthma exacerbation with a steroid, inhaler and cough medication.  Please get plenty of rest and fluids.  Please return if you are not improving, or develop fevers.

## 2023-06-19 NOTE — ED Provider Notes (Signed)
[facility]    CSN: [id number] Arrival date & time: 06/19/23  1335      History   Chief Complaint Chief Complaint  Patient presents with   Cough   Nasal Congestion    HPI Victoria Middleton is a 42 y.o. female.    Cough Associated symptoms: shortness of breath and wheezing   Associated symptoms: no chills, no fever, no rhinorrhea and no sore throat    Patient is here for cough x 3 days, body aches.  No fevers/chills.  No runny nose, congestion.  Feeling drained.  Some wheezing/sob.  She does have asthma, but does not have any inhalers at home.  No otc meds used.  She does not have insurance.  Her son had pneumonia several weeks ago.      Past Medical History:  Diagnosis Date   Asthma    Depression    Drug-seeking behavior    Headache(784.0)    Kidney stones    Migraine    Narcotic abuse (HCC)    PTSD (post-traumatic stress disorder)    Seizures (HCC)    Suicide attempt [facility])     Patient Active Problem List   Diagnosis Date Noted   Anxiety 12/13/2022   Hypertension 12/13/2022   Wrist pain, acute, right 10/12/2022   Upper respiratory infection 09/22/2022   Kidney stones 08/10/2022   Hematuria 08/10/2022   Gastrocnemius tear, left, initial encounter 12/30/2018   Overdose 11/24/2016   Elevated serum creatinine 11/24/2016   Seizure disorder (HCC) 08/24/2016   Intractable chronic migraine without aura and with status migrainosus 01/24/2016   Major depressive disorder, recurrent episode, moderate (HCC) 09/13/2012    Past Surgical History:  Procedure Laterality Date   ANKLE SURGERY     left   ANTERIOR CERVICAL DECOMP/DISCECTOMY FUSION  2018   APPENDECTOMY     CHOLECYSTECTOMY     INCISIONAL HERNIA REPAIR     KNEE SURGERY     TONSILLECTOMY      OB History     Gravida  0   Para      Term      Preterm      AB      Living         SAB      IAB      Ectopic      Multiple      Live Births               Home  Medications    Prior to Admission medications   Medication Sig Start Date End Date Taking? Authorizing Provider  acetaminophen (TYLENOL) 500 MG tablet Take 1,000 mg by mouth every 4 (four) hours as needed for mild pain.     [provider]  amphetamine-dextroamphetamine (ADDERALL XR) 20 MG 24 hr capsule Take 1 capsule (20 mg total) by mouth every morning. 10/18/22   Eloisa Northern, MD  FLUoxetine (PROZAC) 40 MG capsule Take 40 mg by mouth daily.    [provider]  LORazepam (ATIVAN) 0.5 MG tablet Take 1 tablet (0.5 mg total) by mouth every 8 (eight) hours as needed for anxiety. 02/08/23 02/08/24  Eloisa Northern, MD  methocarbamol (ROBAXIN) 500 MG tablet Take 1 tablet (500 mg total) by mouth 2 (two) times daily as needed for muscle spasms. 10/08/22   Mardene Sayer, MD  methylPREDNISolone (MEDROL DOSEPAK) 4 MG TBPK tablet Use as directed 09/22/22   Eloisa Northern, MD  propranolol (INDERAL) 20 MG tablet Take 1 tablet (  20 mg total) by mouth 2 (two) times daily. 12/13/22 12/13/23  Eloisa Northern, MD  QULIPTA 60 MG TABS Take 1 tablet by mouth daily. 07/21/22   Eloisa Northern, MD  traMADol (ULTRAM) 50 MG tablet Take 1 tablet (50 mg total) by mouth every 8 (eight) hours as needed. 10/12/22   Eloisa Northern, MD  tranexamic acid (LYSTEDA) 650 MG TABS tablet Take 2 tablets (1,300 mg total) by mouth 3 (three) times daily. 10/09/22   Eloisa Northern, MD  UBRELVY 100 MG TABS Take 100 mg by mouth daily. Take 1 tablet by mouth 1 time. May repeat dose 1 time after 2 hours as needed. Not to exceed 200mg  in 24 hours 08/02/22   Eloisa Northern, MD    Family History Family History  Problem Relation Age of Onset   Obesity Mother    Diabetes Mother    Hypertension Mother    Depression Mother    Suicidality Mother    Hyperlipidemia Father    Diabetes Father    Hypertension Father    Heart attack Father    COPD Maternal Grandmother    Breast cancer Paternal Grandmother     Social History Social History   Tobacco Use   Smoking  status: Never   Smokeless tobacco: Never  Vaping Use   Vaping status: Never Used  Substance Use Topics   Alcohol use: Yes    Comment: Rare   Drug use: No     Allergies   Compazine [prochlorperazine edisylate], Sulfa antibiotics, Sumatriptan, Aripiprazole, Benadryl [diphenhydramine hcl], Ketorolac tromethamine, Naprosyn [naproxen], Nsaids, Penicillins, and Toradol [ketorolac tromethamine]   Review of Systems Review of Systems  Constitutional:  Positive for fatigue. Negative for chills and fever.  HENT:  Negative for congestion, rhinorrhea and sore throat.   Respiratory:  Positive for cough, shortness of breath and wheezing.   Cardiovascular: Negative.   Gastrointestinal: Negative.   Musculoskeletal: Negative.   Psychiatric/Behavioral: Negative.       Physical Exam Triage Vital Signs ED Triage Vitals  Encounter Vitals Group     BP 06/19/23 1458 (!) 165/84     Systolic BP Percentile --      Diastolic BP Percentile --      Pulse Rate 06/19/23 1458 69     Resp 06/19/23 1458 20     Temp 06/19/23 1458 98 F (36.7 C)     Temp Source 06/19/23 1458 Oral     SpO2 06/19/23 1458 98 %     Weight --      Height --      Head Circumference --      Peak Flow --      Pain Score 06/19/23 1455 0     Pain Loc --      Pain Education --      Exclude from Growth Chart --    No data found.  Updated Vital Signs BP (!) 165/84 (BP Location: Right Arm)   Pulse 69   Temp 98 F (36.7 C) (Oral)   Resp 20   LMP 05/17/2023   SpO2 98%   Visual Acuity Right Eye Distance:   Left Eye Distance:   Bilateral Distance:    Right Eye Near:   Left Eye Near:    Bilateral Near:     Physical Exam Constitutional:      Appearance: Normal appearance. She is normal weight. She is not ill-appearing.  HENT:     Nose: Nose normal. No congestion or rhinorrhea.     Mouth/Throat:  Mouth: Mucous membranes are moist.  Cardiovascular:     Rate and Rhythm: Normal rate and regular rhythm.   Pulmonary:     Effort: Pulmonary effort is normal.     Breath sounds: Normal breath sounds. No wheezing, rhonchi or rales.  Musculoskeletal:     Cervical back: Normal range of motion and neck supple. No tenderness.  Skin:    General: Skin is warm.  Neurological:     General: No focal deficit present.     Mental Status: She is alert.  Psychiatric:        Mood and Affect: Mood normal.     UC Treatments / Results  Labs (all labs ordered are listed, but only abnormal results are displayed) Labs Reviewed - No data to display  EKG   Radiology No results found.  Procedures Procedures (including critical care time)  Medications Ordered in UC Medications - No data to display  Initial Impression / Assessment and Plan / UC Course  I have reviewed the triage vital signs and the nursing notes.  Pertinent labs & imaging results that were available during my care of the patient were reviewed by me and considered in my medical decision making (see chart for details).   Final Clinical Impressions(s) / UC Diagnoses   Final diagnoses:  Mild intermittent asthma with acute exacerbation     Discharge Instructions      You were seen today for upper respiratory symptoms.  Your exam is normal, so we will not do an xray today.  I am treating you for an asthma exacerbation with a steroid, inhaler and cough medication.  Please get plenty of rest and fluids.  Please return if you are not improving, or develop fevers.      ED Prescriptions     Medication Sig Dispense Auth. Provider   methylPREDNISolone (MEDROL DOSEPAK) 4 MG TBPK tablet Take as directed 1 each Inri Sobieski, MD   albuterol (VENTOLIN HFA) 108 (90 Base) MCG/ACT inhaler Inhale 2 puffs into the lungs every 4 (four) hours as needed for wheezing or shortness of breath. 1 each Quintina Hakeem, MD   benzonatate (TESSALON) 200 MG capsule Take 1 capsule (200 mg total) by mouth 3 (three) times daily as needed for cough. 21 capsule  Jannifer Franklin, MD      PDMP not reviewed this encounter.   Jannifer Franklin, MD 06/19/23 1536

## 2023-07-30 NOTE — Progress Notes (Signed)
  Subjective Patient ID: Victoria Middleton is a 43 y.o. female.  Chief Complaint  Patient presents with  . Cough    Pt reported being exposed to FLU. Pt would like to be tested. Is complaining of fever, chills, cough, and sinus congestion.Pt reported taking tylenol  at 9am today    The following information was reviewed by members of the visit team:  Tobacco  Allergies  Meds  Problems  Med Hx  Surg Hx  OB Status   Fam Hx      Pt is a 43 yo here for cough/congestion/fever, onset Wednesday, fever last night 100.0, exposed to influenza concern for same; denies SOB, N/V/D, eating/drinking ok, cough dry and unproductive; stable and in NAD   Cough Associated symptoms include a fever and rhinorrhea.    Review of Systems  Constitutional:  Positive for fatigue and fever.  HENT:  Positive for congestion, rhinorrhea, sinus pressure and sinus pain.   Respiratory:  Positive for cough.   All other systems reviewed and are negative.   Objective Physical Exam Vitals and nursing note reviewed.  Constitutional:      General: She is not in acute distress.    Appearance: She is normal weight.  HENT:     Head: Normocephalic.     Right Ear: Tympanic membrane is bulging.     Left Ear: Tympanic membrane is bulging.     Nose: Congestion present.     Mouth/Throat:     Mouth: Mucous membranes are moist.  Eyes:     Extraocular Movements: Extraocular movements intact.     Pupils: Pupils are equal, round, and reactive to light.  Cardiovascular:     Rate and Rhythm: Normal rate and regular rhythm.     Heart sounds: Normal heart sounds.  Pulmonary:     Effort: Pulmonary effort is normal.     Breath sounds: Normal breath sounds.  Musculoskeletal:        General: Normal range of motion.     Cervical back: Normal range of motion.  Skin:    General: Skin is warm and dry.     Capillary Refill: Capillary refill takes less than 2 seconds.  Neurological:     General: No focal deficit present.      Mental Status: She is alert and oriented to person, place, and time.  Psychiatric:        Mood and Affect: Mood normal.        Behavior: Behavior normal.     Assessment/Plan Diagnoses and all orders for this visit:  Acute cough -     XR Chest 2 Views -     POC SARS-COV-2 SYMPTOMATIC (IDNOW) -     POC Influenza A&B NAT (IDNOW)  Fever, unspecified fever cause -     XR Chest 2 Views -     POC SARS-COV-2 SYMPTOMATIC (IDNOW) -     POC Influenza A&B NAT (IDNOW)  Viral illness   DDX: influenza, COVID, pneumonia, streptococcal tonsillitis, bronchitis, sinusitis, viral syndrome, bacterial infection  MDM: Pt here for cough, congestion, fever, CXR normal, favor viral; recc watchful waiting, symptomatic tx, discussed symptomatic treatment as well as follow up, advised to see PCP in 3-4 days for reassessment,  advised on red flags warranting ER evaluation, pt verbalized understanding of all instructions, agreeable to plan of care,  and is stable at departure.    Electronically signed: Rexene Charlies Pouch, NP 07/30/2023  1:49 PM

## 2024-03-13 ENCOUNTER — Encounter (HOSPITAL_COMMUNITY): Payer: Self-pay

## 2024-03-13 ENCOUNTER — Ambulatory Visit (HOSPITAL_COMMUNITY)
Admission: EM | Admit: 2024-03-13 | Discharge: 2024-03-13 | Disposition: A | Discharge status: Home / Self Care | Payer: Self-pay

## 2024-03-13 DIAGNOSIS — M5412 Radiculopathy, cervical region: Secondary | ICD-10-CM

## 2024-03-13 MED ORDER — DEXAMETHASONE SODIUM PHOSPHATE 10 MG/ML IJ SOLN
10.0000 mg | Freq: Once | INTRAMUSCULAR | Status: AC
  Administered 2024-03-13: 10 mg via INTRAMUSCULAR

## 2024-03-13 MED ORDER — DEXAMETHASONE SODIUM PHOSPHATE 10 MG/ML IJ SOLN
INTRAMUSCULAR | Status: AC
  Filled 2024-03-13: qty 1

## 2024-03-13 MED ORDER — CYCLOBENZAPRINE HCL 10 MG PO TABS: 10.0000 mg | ORAL_TABLET | Freq: Two times a day (BID) | ORAL | 0 refills | Status: AC | PRN

## 2024-03-13 NOTE — Discharge Instructions (Addendum)
 You were seen today for right-sided neck pain with numbness and tingling that radiates down your right arm. You also reported mild weakness and a tendency to drop objects. These symptoms are most consistent with cervical radiculopathy, which occurs when a nerve in the neck is irritated or compressed. Your history of prior cervical spine surgery increases the importance of close follow-up.  You received a steroid injection in the clinic today to help reduce inflammation. Flexeril  was prescribed to take as needed, up to twice a day, to help relax the muscles and relieve discomfort. This medication may make you drowsy, so avoid driving or operating machinery until you know how it affects you. Because of your allergy, you should not take NSAIDs such as ibuprofen  or naproxen. You may use Tylenol  for additional pain relief if needed, but do not exceed the recommended daily dose. At home, try to avoid activities or positions that make your pain worse, take breaks from prolonged sitting or driving, and use supportive pillows when resting. Gentle stretching may help, but stop if pain increases.  It is important that you follow up with orthopedic urgent care for further evaluation and management of your neck and arm symptoms. They may consider imaging studies or other treatments to prevent worsening nerve irritation.  Go to the emergency department immediately if you develop new or worsening weakness, loss of sensation, difficulty walking, loss of control of your bladder or bowels, severe worsening pain, or any other concerning changes.

## 2024-03-13 NOTE — ED Provider Notes (Signed)
 [facility]    CSN: [id number] Arrival date & time: 03/13/24  1745      History   Chief Complaint Chief Complaint  Patient presents with   Neck Pain   arm numbness    HPI Victoria Middleton is a 43 y.o. female.   Discussed the use of AI scribe software for clinical note transcription with the patient, who gave verbal consent to proceed.       Past Medical History:  Diagnosis Date   Asthma    Depression    Drug-seeking behavior    Headache(784.0)    Kidney stones    Migraine    Narcotic abuse (HCC)    PTSD (post-traumatic stress disorder)    Seizures (HCC)    Suicide attempt [facility])     Patient Active Problem List   Diagnosis Date Noted   Anxiety 12/13/2022   Hypertension 12/13/2022   Wrist pain, acute, right 10/12/2022   Upper respiratory infection 09/22/2022   Kidney stones 08/10/2022   Hematuria 08/10/2022   Gastrocnemius tear, left, initial encounter 12/30/2018   Overdose 11/24/2016   Elevated serum creatinine 11/24/2016   Seizure disorder (HCC) 08/24/2016   Intractable chronic migraine without aura and with status migrainosus 01/24/2016   Major depressive disorder, recurrent episode, moderate (HCC) 09/13/2012    Past Surgical History:  Procedure Laterality Date   ANKLE SURGERY     left   ANTERIOR CERVICAL DECOMP/DISCECTOMY FUSION  2018   APPENDECTOMY     CHOLECYSTECTOMY     INCISIONAL HERNIA REPAIR     KNEE SURGERY     TONSILLECTOMY      OB History     Gravida  0   Para      Term      Preterm      AB      Living         SAB      IAB      Ectopic      Multiple      Live Births               Home Medications    Prior to Admission medications   Medication Sig Start Date End Date Taking? Authorizing Provider  clonazePAM (KLONOPIN) 0.5 MG tablet Take 0.5 mg by mouth daily. And as needed   Yes [provider]  cyclobenzaprine  (FLEXERIL ) 10 MG tablet Take 1 tablet (10 mg total) by mouth 2 (two) times  daily as needed for muscle spasms. 03/13/24  Yes Iola Lukes, FNP  DULoxetine  (CYMBALTA ) 30 MG capsule Take 30 mg by mouth daily.   Yes [provider]  DULoxetine  (CYMBALTA ) 60 MG capsule Take 60 mg by mouth daily.   Yes [provider]  acetaminophen  (TYLENOL ) 500 MG tablet Take 1,000 mg by mouth every 4 (four) hours as needed for mild pain.     [provider]    Family History Family History  Problem Relation Age of Onset   Obesity Mother    Diabetes Mother    Hypertension Mother    Depression Mother    Suicidality Mother    Hyperlipidemia Father    Diabetes Father    Hypertension Father    Heart attack Father    COPD Maternal Grandmother    Breast cancer Paternal Grandmother     Social History Social History   Tobacco Use   Smoking status: Never   Smokeless tobacco: Never  Vaping Use   Vaping status: Never  Used  Substance Use Topics   Alcohol use: Yes    Comment: Rare   Drug use: No     Allergies   Compazine  [prochlorperazine  edisylate], Sulfa antibiotics, Sumatriptan , Aripiprazole, Benadryl [diphenhydramine hcl], Ketorolac tromethamine, Naprosyn [naproxen], Nsaids, Penicillins, and Toradol [ketorolac tromethamine]   Review of Systems Review of Systems  Musculoskeletal:  Positive for arthralgias (right shoulder pain x 1 year, increasd over the past month, worse over the past week) and neck pain (right sided).  Neurological:  Positive for weakness (decreased strength of right hand) and numbness (and tingling down the right arm).  All other systems reviewed and are negative.    Physical Exam Triage Vital Signs ED Triage Vitals  Encounter Vitals Group     BP 03/13/24 1809 (!) 155/95     Girls Systolic BP Percentile --      Girls Diastolic BP Percentile --      Boys Systolic BP Percentile --      Boys Diastolic BP Percentile --      Pulse Rate 03/13/24 1809 72     Resp 03/13/24 1809 16     Temp 03/13/24 1809 98.2 F (36.8  C)     Temp Source 03/13/24 1809 Oral     SpO2 03/13/24 1809 97 %     Weight --      Height --      Head Circumference --      Peak Flow --      Pain Score 03/13/24 1807 6     Pain Loc --      Pain Education --      Exclude from Growth Chart --    No data found.  Updated Vital Signs BP (!) 155/95 (BP Location: Left Arm)   Pulse 72   Temp 98.2 F (36.8 C) (Oral)   Resp 16   LMP 01/08/2024   SpO2 97%   Visual Acuity Right Eye Distance:   Left Eye Distance:   Bilateral Distance:    Right Eye Near:   Left Eye Near:    Bilateral Near:     Physical Exam Vitals reviewed.  Constitutional:      General: She is awake. She is not in acute distress.    Appearance: Normal appearance. She is well-developed. She is not ill-appearing, toxic-appearing or diaphoretic.  HENT:     Head: Normocephalic.     Right Ear: Hearing normal.     Left Ear: Hearing normal.     Nose: Nose normal.     Mouth/Throat:     Mouth: Mucous membranes are moist.  Eyes:     General: Vision grossly intact.     Conjunctiva/sclera: Conjunctivae normal.  Neck:   Cardiovascular:     Rate and Rhythm: Normal rate and regular rhythm.     Heart sounds: Normal heart sounds.  Pulmonary:     Effort: Pulmonary effort is normal.     Breath sounds: Normal breath sounds and air entry.  Musculoskeletal:        General: Normal range of motion.     Right shoulder: Normal.     Cervical back: Normal range of motion and neck supple. No rigidity, torticollis or crepitus. Pain with movement and muscular tenderness (right-sided) present. No spinous process tenderness. Normal range of motion.     Comments: Examination of the right shoulder reveals no pain, deformity, or limitation in range of motion. Shoulder movement does, however, reproduce discomfort along the right lateral aspect of the neck.  No  focal deficits noted.  Skin:    General: Skin is warm and dry.  Neurological:     General: No focal deficit present.      Mental Status: She is alert and oriented to person, place, and time.     Sensory: Sensation is intact. No sensory deficit.     Motor: Motor function is intact.  Psychiatric:        Speech: Speech normal.        Behavior: Behavior is cooperative.      UC Treatments / Results  Labs (all labs ordered are listed, but only abnormal results are displayed) Labs Reviewed - No data to display  EKG   Radiology No results found.  Procedures Procedures (including critical care time)  Medications Ordered in UC Medications  dexamethasone  (DECADRON ) injection 10 mg (has no administration in time range)    Initial Impression / Assessment and Plan / UC Course  I have reviewed the triage vital signs and the nursing notes.  Pertinent labs & imaging results that were available during my care of the patient were reviewed by me and considered in my medical decision making (see chart for details).     Patient presents with a one-year history of right-sided neck pain with new onset of numbness, tingling, and mild weakness in the right arm over the past month, worsening this past week. She reports increased tendency to drop objects. Past history is significant for cervical spinal surgery with hardware placement. Prior treatments including a steroid pack, traction, exercises, and Tylenol  have provided little relief. She is unable to take NSAIDs due to allergy.  Physical exam shows full range of motion of the right arm and neck.  Equal grips bilaterally.  No focal deficits noted.  Given the distribution of symptoms and her surgical history, cervical radiculopathy is the most likely diagnosis. Flexeril  was prescribed for symptomatic relief, and an intramuscular steroid injection was administered in clinic. Patient was advised to follow up with orthopedic urgent care for further evaluation and management. Emergency precautions were discussed for worsening weakness, loss of sensation, bowel or bladder  dysfunction, or uncontrolled pain.  Today's evaluation has revealed no signs of a dangerous process. Discussed diagnosis with patient and/or guardian. Patient and/or guardian aware of their diagnosis, possible red flag symptoms to watch out for and need for close follow up. Patient and/or guardian understands verbal and written discharge instructions. Patient and/or guardian comfortable with plan and disposition.  Patient and/or guardian has a clear mental status at this time, good insight into illness (after discussion and teaching) and has clear judgment to make decisions regarding their care  Documentation was completed with the aid of voice recognition software. Transcription may contain typographical errors. Final Clinical Impressions(s) / UC Diagnoses   Final diagnoses:  Cervical radiculopathy     Discharge Instructions      You were seen today for right-sided neck pain with numbness and tingling that radiates down your right arm. You also reported mild weakness and a tendency to drop objects. These symptoms are most consistent with cervical radiculopathy, which occurs when a nerve in the neck is irritated or compressed. Your history of prior cervical spine surgery increases the importance of close follow-up.  You received a steroid injection in the clinic today to help reduce inflammation. Flexeril  was prescribed to take as needed, up to twice a day, to help relax the muscles and relieve discomfort. This medication may make you drowsy, so avoid driving or operating machinery until you  know how it affects you. Because of your allergy, you should not take NSAIDs such as ibuprofen  or naproxen. You may use Tylenol  for additional pain relief if needed, but do not exceed the recommended daily dose. At home, try to avoid activities or positions that make your pain worse, take breaks from prolonged sitting or driving, and use supportive pillows when resting. Gentle stretching may help, but stop if  pain increases.  It is important that you follow up with orthopedic urgent care for further evaluation and management of your neck and arm symptoms. They may consider imaging studies or other treatments to prevent worsening nerve irritation.  Go to the emergency department immediately if you develop new or worsening weakness, loss of sensation, difficulty walking, loss of control of your bladder or bowels, severe worsening pain, or any other concerning changes.     ED Prescriptions     Medication Sig Dispense Auth. Provider   cyclobenzaprine  (FLEXERIL ) 10 MG tablet Take 1 tablet (10 mg total) by mouth 2 (two) times daily as needed for muscle spasms. 20 tablet Iola Lukes, FNP      PDMP not reviewed this encounter.   Iola Lukes, [place] 03/13/24 [phone]

## 2024-03-13 NOTE — ED Triage Notes (Signed)
 Patient reports that she has been having right shoulder and neck pain x 1 year ago and arm numbness x 1 week. Patient is able to raise her right arm, but states that it feels like it is asleep.  Patient states she has done steroid packs and exercising/stretching.

## 2024-05-17 ENCOUNTER — Encounter (HOSPITAL_BASED_OUTPATIENT_CLINIC_OR_DEPARTMENT_OTHER): Payer: Self-pay | Admitting: Emergency Medicine

## 2024-05-17 ENCOUNTER — Other Ambulatory Visit: Payer: Self-pay

## 2024-05-17 ENCOUNTER — Emergency Department (HOSPITAL_BASED_OUTPATIENT_CLINIC_OR_DEPARTMENT_OTHER)
Admission: EM | Admit: 2024-05-17 | Discharge: 2024-05-17 | Disposition: A | Discharge status: Home / Self Care | Payer: Self-pay | Attending: Emergency Medicine | Admitting: Emergency Medicine

## 2024-05-17 ENCOUNTER — Emergency Department (HOSPITAL_BASED_OUTPATIENT_CLINIC_OR_DEPARTMENT_OTHER): Payer: Self-pay

## 2024-05-17 DIAGNOSIS — W1809XA Striking against other object with subsequent fall, initial encounter: Secondary | ICD-10-CM | POA: Insufficient documentation

## 2024-05-17 DIAGNOSIS — S60222A Contusion of left hand, initial encounter: Secondary | ICD-10-CM | POA: Insufficient documentation

## 2024-05-17 MED ORDER — OXYCODONE-ACETAMINOPHEN 5-325 MG PO TABS
1.0000 | ORAL_TABLET | Freq: Once | ORAL | Status: AC
  Administered 2024-05-17: 1 via ORAL
  Filled 2024-05-17: qty 1

## 2024-05-17 NOTE — ED Provider Notes (Signed)
 [place] [facility] AT [facility] Provider Note   CSN: [id number] Arrival date & time: 05/17/24  1723     Patient presents with: Hand Injury   Victoria Middleton is a 43 y.o. female.  Who presents to the ED for a left hand injury.  Patient fell backwards striking her left hand on a metal shoe rack 2 days ago.  She did not seek evaluation at that time.  Since then has had severe pain and bruising over the left hand most notably over the index finger and thumb.  Tylenol  has been effective in providing pain relief.  No other injuries were sustained.  She is right-hand dominant    Hand Injury      Prior to Admission medications   Medication Sig Start Date End Date Taking? Authorizing Provider  acetaminophen  (TYLENOL ) 500 MG tablet Take 1,000 mg by mouth every 4 (four) hours as needed for mild pain.     [provider]  clonazePAM (KLONOPIN) 0.5 MG tablet Take 0.5 mg by mouth daily. And as needed    [provider]  cyclobenzaprine  (FLEXERIL ) 10 MG tablet Take 1 tablet (10 mg total) by mouth 2 (two) times daily as needed for muscle spasms. 03/13/24   Iola Lukes, FNP  DULoxetine  (CYMBALTA ) 30 MG capsule Take 30 mg by mouth daily.    [provider]  DULoxetine  (CYMBALTA ) 60 MG capsule Take 60 mg by mouth daily.    [provider]    Allergies: Compazine  [prochlorperazine  edisylate], Sulfa antibiotics, Sumatriptan , Aripiprazole, Benadryl [diphenhydramine hcl], Ketorolac tromethamine, Naprosyn [naproxen], Nsaids, Penicillins, and Toradol [ketorolac tromethamine]    Review of Systems  Updated Vital Signs BP (!) 148/75 (BP Location: Right Arm)   Pulse 78   Temp (!) 97.3 F (36.3 C)   Resp 20   Ht 5' 8 (1.727 m)   Wt (!) 145.2 kg   LMP 04/25/2024   SpO2 99%   BMI 48.66 kg/m   Physical Exam Vitals and nursing note reviewed.  HENT:     Head: Normocephalic and atraumatic.  Eyes:     Pupils: Pupils are equal, round, and  reactive to light.  Cardiovascular:     Rate and Rhythm: Normal rate and regular rhythm.  Pulmonary:     Effort: Pulmonary effort is normal.     Breath sounds: Normal breath sounds.  Abdominal:     Palpations: Abdomen is soft.     Tenderness: There is no abdominal tenderness.  Musculoskeletal:     Comments: Circumferential swelling and ecchymosis over proximal left index finger with ecchymosis over thumb along the dorsal aspect Tenderness to palpation of the proximal left index finger and proximal thumb Sensation intact to touch throughout Able to move all fingers although limited range of motion due to pain in the left index finger with flexion No snuffbox tenderness, tenderness of the wrist or left elbow  Skin:    General: Skin is warm and dry.  Neurological:     Mental Status: She is alert.  Psychiatric:        Mood and Affect: Mood normal.     (all labs ordered are listed, but only abnormal results are displayed) Labs Reviewed - No data to display  EKG: None  Radiology: DG Hand Complete Left Result Date: 05/17/2024 EXAM: 3 OR MORE VIEW(S) XRAY OF THE LEFT HAND 05/17/2024 06:39:13 PM COMPARISON: None available. CLINICAL HISTORY: 809823 Fall 190176 FINDINGS: BONES AND JOINTS: No acute fracture. No focal osseous lesion.  No joint dislocation. SOFT TISSUES: The soft tissues are unremarkable. IMPRESSION: 1. No acute osseous abnormality. Electronically signed by: Franky Crease MD 05/17/2024 06:42 PM EDT RP Workstation: [id number]     Procedures   Medications Ordered in the ED  oxyCODONE -acetaminophen  (PERCOCET/ROXICET) 5-325 MG per tablet 1 tablet (1 tablet Oral Given 05/17/24 1827)    Clinical Course as of 05/17/24 1911  Sat May 17, 2024  1909 Surprisingly there is no fracture of the left hand.  Informed patient and her husband of these results.  Will discharge with splint in place and give her hand follow-up if her symptoms worsen. [MP]    Clinical Course User Index [MP]  Pamella Ozell LABOR, DO                                 Medical Decision Making 44 year old female with history as above presenting to the ED for an injury sustained during fall backwards 2 days ago.  Struck her hand on a metal shoe rack.  Bruising and tenderness over left index finger left thumb concerning for at least a fracture of proximal index finger.  Will obtain left hand x-ray.  No other injuries apparent on exam no snuffbox tenderness tenderness to the wrist elbow or shoulder.  Percocet for pain control.  Amount and/or Complexity of Data Reviewed Radiology: ordered.  Risk Prescription drug management.        Final diagnoses:  Contusion of left hand, initial encounter    ED Discharge Orders     None          Pamella Ozell LABOR, DO 05/17/24 1911

## 2024-05-17 NOTE — Discharge Instructions (Signed)
 You were seen in the emergency room for a left hand injury There were no broken bones seen on x-ray We discharged you with a splint in place to help with the discomfort You can leave this in place but be sure to stretch her hand at least a few times a day Take Tylenol  as directed for pain You can follow-up with Dr. Shari who is a hand specialist in the office if your symptoms do not improve Return to the emergency room for severe pain or if you are unable to use the left hand

## 2024-05-17 NOTE — ED Triage Notes (Signed)
 PT c/o LT hand pain s/p fall on Thurs; sts she fell backward and hit it on a metal shoe rack; swelling and bruising present
